# Patient Record
Sex: Female | Born: 1935
Health system: Southern US, Community
[De-identification: ages and names within clinical notes are randomized; demographics above are authoritative.]

## PROBLEM LIST (undated history)

## (undated) DIAGNOSIS — I350 Nonrheumatic aortic (valve) stenosis: Secondary | ICD-10-CM

## (undated) DIAGNOSIS — F419 Anxiety disorder, unspecified: Secondary | ICD-10-CM

## (undated) DIAGNOSIS — I10 Essential (primary) hypertension: Secondary | ICD-10-CM

## (undated) DIAGNOSIS — G4700 Insomnia, unspecified: Secondary | ICD-10-CM

## (undated) DIAGNOSIS — I779 Disorder of arteries and arterioles, unspecified: Secondary | ICD-10-CM

## (undated) DIAGNOSIS — N3281 Overactive bladder: Secondary | ICD-10-CM

## (undated) DIAGNOSIS — E119 Type 2 diabetes mellitus without complications: Secondary | ICD-10-CM

## (undated) DIAGNOSIS — I4891 Unspecified atrial fibrillation: Secondary | ICD-10-CM

## (undated) DIAGNOSIS — E785 Hyperlipidemia, unspecified: Secondary | ICD-10-CM

## (undated) HISTORY — PX: OTHER SURGICAL HISTORY: SHX169

## (undated) HISTORY — DX: Overactive bladder: N32.81

## (undated) HISTORY — DX: Unspecified atrial fibrillation: I48.91

## (undated) HISTORY — DX: Nonrheumatic aortic (valve) stenosis: I35.0

## (undated) HISTORY — DX: Anxiety disorder, unspecified: F41.9

## (undated) HISTORY — DX: Disorder of arteries and arterioles, unspecified: I77.9

## (undated) HISTORY — DX: Hyperlipidemia, unspecified: E78.5

## (undated) HISTORY — PX: APPENDECTOMY: SHX54

## (undated) HISTORY — DX: Insomnia, unspecified: G47.00

---

## 2004-09-10 ENCOUNTER — Ambulatory Visit: Payer: Self-pay | Admitting: Gastroenterology

## 2007-10-04 ENCOUNTER — Ambulatory Visit: Payer: Self-pay | Admitting: Internal Medicine

## 2007-11-07 ENCOUNTER — Ambulatory Visit: Payer: Self-pay | Admitting: Internal Medicine

## 2008-11-11 ENCOUNTER — Ambulatory Visit: Payer: Self-pay | Admitting: Internal Medicine

## 2009-01-26 ENCOUNTER — Emergency Department: Payer: Self-pay

## 2009-11-27 ENCOUNTER — Ambulatory Visit: Payer: Self-pay | Admitting: Internal Medicine

## 2011-12-13 ENCOUNTER — Ambulatory Visit: Payer: Self-pay | Admitting: Internal Medicine

## 2012-05-02 ENCOUNTER — Ambulatory Visit: Payer: Self-pay | Admitting: Family Medicine

## 2012-06-09 ENCOUNTER — Ambulatory Visit: Payer: Self-pay | Admitting: Family Medicine

## 2012-06-15 ENCOUNTER — Ambulatory Visit: Payer: Self-pay | Admitting: Family Medicine

## 2012-06-20 ENCOUNTER — Ambulatory Visit: Payer: Self-pay

## 2012-06-20 LAB — HEMATOCRIT: HCT: 43.8 % (ref 35.0–47.0)

## 2012-06-20 LAB — BASIC METABOLIC PANEL
Anion Gap: 6 — ABNORMAL LOW (ref 7–16)
BUN: 22 mg/dL — ABNORMAL HIGH (ref 7–18)
Calcium, Total: 9.3 mg/dL (ref 8.5–10.1)
Chloride: 100 mmol/L (ref 98–107)
EGFR (Non-African Amer.): >60
Glucose: 126 mg/dL — ABNORMAL HIGH (ref 65–99)
Osmolality: 281 (ref 275–301)
Potassium: 3.4 mmol/L — ABNORMAL LOW (ref 3.5–5.1)
Sodium: 138 mmol/L (ref 136–145)

## 2012-06-29 ENCOUNTER — Ambulatory Visit: Payer: Self-pay

## 2012-07-18 ENCOUNTER — Ambulatory Visit: Payer: Self-pay | Admitting: Family Medicine

## 2012-08-15 ENCOUNTER — Ambulatory Visit: Payer: Self-pay | Admitting: Family Medicine

## 2013-06-14 ENCOUNTER — Ambulatory Visit: Payer: Self-pay | Admitting: Family Medicine

## 2014-08-14 ENCOUNTER — Ambulatory Visit: Payer: Self-pay | Admitting: Family Medicine

## 2015-03-04 NOTE — Op Note (Signed)
PATIENT NAME:  Melinda Preston, Melinda Preston MR#:  [id number] DATE OF BIRTH:  03-30-1928  DATE OF PROCEDURE:  06/29/2012  PREOPERATIVE DIAGNOSIS: Possible endometrial hyperplasia.   POSTOPERATIVE DIAGNOSIS: Possible endometrial hyperplasia.   OPERATION PERFORMED: Attempted dilation and curettage (failed).   SURGEON: Deloris Pinghilip J. Luella Cookosenow, M.D.   OPERATIVE FINDINGS: No abnormal masses on bimanual examination including good examination of the cervix.   DESCRIPTION OF PROCEDURE: After adequate general anesthesia, the patient was prepped and draped in routine fashion. The cervix was grasped with a Jacob's tenaculum, and I was unable to sound the uterine cavity using both the radio sound and the lacrimal probes. Therefore, the surgery was canceled. ____________________________ Deloris PingPhilip J. Luella Cookosenow, MD pjr:slb D: 06/29/2012 11:24:54 ET T: 06/29/2012 13:43:41 ET JOB#: [id number]  cc: Deloris PingPhilip J. Luella Cookosenow, MD, <Dictator> Towana BadgerPHILIP J Boyde Grieco MD ELECTRONICALLY SIGNED 07/03/2012 11:02

## 2015-09-15 ENCOUNTER — Encounter: Payer: Self-pay | Admitting: Emergency Medicine

## 2015-09-15 ENCOUNTER — Ambulatory Visit
Admission: EM | Admit: 2015-09-15 | Discharge: 2015-09-15 | Disposition: A | Discharge status: Home / Self Care | Payer: Medicare HMO | Attending: Family Medicine | Admitting: Family Medicine

## 2015-09-15 DIAGNOSIS — M25861 Other specified joint disorders, right knee: Secondary | ICD-10-CM | POA: Diagnosis not present

## 2015-09-15 DIAGNOSIS — M25562 Pain in left knee: Secondary | ICD-10-CM

## 2015-09-15 DIAGNOSIS — M25561 Pain in right knee: Secondary | ICD-10-CM | POA: Diagnosis not present

## 2015-09-15 DIAGNOSIS — R2241 Localized swelling, mass and lump, right lower limb: Secondary | ICD-10-CM

## 2015-09-15 HISTORY — DX: Type 2 diabetes mellitus without complications: E11.9

## 2015-09-15 HISTORY — DX: Essential (primary) hypertension: I10

## 2015-09-15 NOTE — ED Notes (Signed)
Patient c/o pain in the back of her knees for 3-4 weeks.

## 2015-09-15 NOTE — ED Notes (Signed)
[facility] Appointment scheduled for November 9 at 10:30am in [place]

## 2015-09-15 NOTE — ED Provider Notes (Signed)
CSN: [id number]     Arrival date & time 09/15/15  0903 History   First MD Initiated Contact with Patient 09/15/15 1113     Chief Complaint  Patient presents with  . Leg Pain   (Consider location/radiation/quality/duration/timing/severity/associated sxs/prior Treatment) HPI Comments: 81 yo female with a 3-4 weeks h/o bilateral posterior knee pain, worse on the right. Denies any trauma, fevers, chills, redness, drainage. States the back of the right knee feels stiff.   Patient is a 81 y.o. female presenting with leg pain. The history is provided by the patient.  Leg Pain   Past Medical History  Diagnosis Date  . Hypertension   . Diabetes mellitus without complication (HCC)    History reviewed. No pertinent past surgical history. History reviewed. No pertinent family history. Social History  Substance Use Topics  . Smoking status: Never Smoker   . Smokeless tobacco: Never Used  . Alcohol Use: No   OB History    No data available     Review of Systems  Allergies  Review of patient's allergies indicates no known allergies.  Home Medications   Prior to Admission medications   Medication Sig Start Date End Date Taking? Authorizing Provider  ALPRAZolam Prudy Feeler) 0.25 MG tablet Take 0.25 mg by mouth at bedtime as needed for anxiety.   Yes Historical Provider, MD  apixaban (ELIQUIS) 2.5 MG TABS tablet Take 2.5 mg by mouth 2 (two) times daily.   Yes Historical Provider, MD  aspirin 81 MG tablet Take 81 mg by mouth daily.   Yes Historical Provider, MD  atorvastatin (LIPITOR) 20 MG tablet Take 20 mg by mouth daily.   Yes Historical Provider, MD  lisinopril-hydrochlorothiazide (PRINZIDE,ZESTORETIC) 20-12.5 MG tablet Take 1 tablet by mouth daily.   Yes Historical Provider, MD  metFORMIN (GLUCOPHAGE) 500 MG tablet Take 500 mg by mouth 2 (two) times daily with a meal.   Yes Historical Provider, MD  omega-3 acid ethyl esters (LOVAZA) 1 G capsule Take 1 g by mouth 2 (two) times daily.   Yes  Historical Provider, MD  omeprazole (PRILOSEC) 20 MG capsule Take 20 mg by mouth daily.   Yes Historical Provider, MD  oxybutynin (DITROPAN-XL) 5 MG 24 hr tablet Take 5 mg by mouth at bedtime.   Yes Historical Provider, MD  sertraline (ZOLOFT) 25 MG tablet Take 25 mg by mouth daily.   Yes Historical Provider, MD   Meds Ordered and Administered this Visit  Medications - No data to display  BP 140/78 mmHg  Pulse 76  Temp(Src) 98 F (36.7 C) (Tympanic)  Resp 20  Ht 5' (1.524 m)  Wt 161 lb (73.029 kg)  BMI 31.44 kg/m2  SpO2 98% No data found.   Physical Exam  Constitutional: She appears well-developed and well-nourished. No distress.  Musculoskeletal:       Right knee: She exhibits normal range of motion, no swelling, no ecchymosis, no laceration, no erythema and no bony tenderness.  Normal range of motion; no bony tenderness; lower extremities neurovascularly intact; 3x4cm palpable subcutaneous mass on the posterior right knee (nontender; no erythema or drainage)  Skin: She is not diaphoretic.  Nursing note and vitals reviewed.   ED Course  Procedures (including critical care time)  Labs Review Labs Reviewed - No data to display  Imaging Review No results found.   Visual Acuity Review  Right Eye Distance:   Left Eye Distance:   Bilateral Distance:    Right Eye Near:   Left Eye Near:  Bilateral Near:         MDM   1. Arthralgia of both knees   2. Mass of knee, right   (likely Baker's cyst)  1.likely diagnosis reviewed with patient 2. Recommend patient follow up with orthopedist for further evaluation and management ([facility] Nov. 9th, 2016)  Payton Mccallumrlando Rishav Rockefeller, MD 09/15/15 1156

## 2015-09-15 NOTE — ED Notes (Signed)
Bilateral thigh measurement 16.5 inches.

## 2015-11-19 DIAGNOSIS — M1990 Unspecified osteoarthritis, unspecified site: Secondary | ICD-10-CM | POA: Diagnosis not present

## 2015-11-19 DIAGNOSIS — M170 Bilateral primary osteoarthritis of knee: Secondary | ICD-10-CM | POA: Diagnosis not present

## 2015-11-26 DIAGNOSIS — M170 Bilateral primary osteoarthritis of knee: Secondary | ICD-10-CM | POA: Diagnosis not present

## 2015-12-03 DIAGNOSIS — M170 Bilateral primary osteoarthritis of knee: Secondary | ICD-10-CM | POA: Diagnosis not present

## 2015-12-22 ENCOUNTER — Encounter: Payer: Self-pay | Admitting: Unknown Physician Specialty

## 2015-12-22 ENCOUNTER — Ambulatory Visit (INDEPENDENT_AMBULATORY_CARE_PROVIDER_SITE_OTHER): Payer: Medicare Other | Admitting: Unknown Physician Specialty

## 2015-12-22 VITALS — BP 155/87 | HR 105 | Temp 97.7°F | Ht 59.7 in | Wt 145.2 lb

## 2015-12-22 DIAGNOSIS — D649 Anemia, unspecified: Secondary | ICD-10-CM | POA: Diagnosis not present

## 2015-12-22 DIAGNOSIS — E119 Type 2 diabetes mellitus without complications: Secondary | ICD-10-CM

## 2015-12-22 DIAGNOSIS — N3281 Overactive bladder: Secondary | ICD-10-CM | POA: Diagnosis not present

## 2015-12-22 DIAGNOSIS — R634 Abnormal weight loss: Secondary | ICD-10-CM | POA: Diagnosis not present

## 2015-12-22 DIAGNOSIS — M170 Bilateral primary osteoarthritis of knee: Secondary | ICD-10-CM | POA: Diagnosis not present

## 2015-12-22 DIAGNOSIS — I10 Essential (primary) hypertension: Secondary | ICD-10-CM | POA: Diagnosis not present

## 2015-12-22 DIAGNOSIS — I482 Chronic atrial fibrillation, unspecified: Secondary | ICD-10-CM

## 2015-12-22 DIAGNOSIS — M1990 Unspecified osteoarthritis, unspecified site: Secondary | ICD-10-CM | POA: Insufficient documentation

## 2015-12-22 DIAGNOSIS — F329 Major depressive disorder, single episode, unspecified: Secondary | ICD-10-CM | POA: Diagnosis not present

## 2015-12-22 DIAGNOSIS — E7800 Pure hypercholesterolemia, unspecified: Secondary | ICD-10-CM

## 2015-12-22 DIAGNOSIS — G4700 Insomnia, unspecified: Secondary | ICD-10-CM

## 2015-12-22 DIAGNOSIS — F32A Depression, unspecified: Secondary | ICD-10-CM

## 2015-12-22 LAB — LIPID PANEL PICCOLO, WAIVED
CHOL/HDL RATIO PICCOLO,WAIVE: 2.7 mg/dL
Cholesterol Piccolo, Waived: 162 mg/dL (ref <200)
HDL Chol Piccolo, Waived: 61 mg/dL (ref >59)
LDL CHOL CALC PICCOLO WAIVED: 74 mg/dL (ref <100)
Triglycerides Piccolo,Waived: 137 mg/dL (ref <150)
VLDL Chol Calc Piccolo,Waive: 27 mg/dL (ref <30)

## 2015-12-22 LAB — BAYER DCA HB A1C WAIVED: HB A1C: 6.5 % (ref <7.0)

## 2015-12-22 MED ORDER — TRAZODONE HCL 50 MG PO TABS: 25.0000 mg | ORAL_TABLET | Freq: Every evening | ORAL | Status: DC | PRN

## 2015-12-22 MED ORDER — APIXABAN 2.5 MG PO TABS: 2.5000 mg | ORAL_TABLET | Freq: Two times a day (BID) | ORAL | Status: DC

## 2015-12-22 NOTE — Assessment & Plan Note (Addendum)
Taking iron supplements.  I cannot find the reference in old charts.  Last CBC was good.  Stop iron

## 2015-12-22 NOTE — Patient Instructions (Addendum)
Insomnia Insomnia is a sleep disorder that makes it difficult to fall asleep or to stay asleep. Insomnia can cause tiredness (fatigue), low energy, difficulty concentrating, mood swings, and poor performance at work or school.  There are three different ways to classify insomnia:  Difficulty falling asleep.  Difficulty staying asleep.  Waking up too early in the morning. Any type of insomnia can be long-term (chronic) or short-term (acute). Both are common. Short-term insomnia usually lasts for three months or less. Chronic insomnia occurs at least three times a week for longer than three months. CAUSES  Insomnia may be caused by another condition, situation, or substance, such as:  Anxiety.  Certain medicines.  Gastroesophageal reflux disease (GERD) or other gastrointestinal conditions.  Asthma or other breathing conditions.  Restless legs syndrome, sleep apnea, or other sleep disorders.  Chronic pain.  Menopause. This may include hot flashes.  Stroke.  Abuse of alcohol, tobacco, or illegal drugs.  Depression.  Caffeine.   Neurological disorders, such as Alzheimer disease.  An overactive thyroid (hyperthyroidism). The cause of insomnia may not be known. RISK FACTORS Risk factors for insomnia include:  Gender. Women are more commonly affected than men.  Age. Insomnia is more common as you get older.  Stress. This may involve your professional or personal life.  Income. Insomnia is more common in people with lower income.  Lack of exercise.   Irregular work schedule or night shifts.  Traveling between different time zones. SIGNS AND SYMPTOMS If you have insomnia, trouble falling asleep or trouble staying asleep is the main symptom. This may lead to other symptoms, such as:  Feeling fatigued.  Feeling nervous about going to sleep.  Not feeling rested in the morning.  Having trouble concentrating.  Feeling irritable, anxious, or depressed. TREATMENT   Treatment for insomnia depends on the cause. If your insomnia is caused by an underlying condition, treatment will focus on addressing the condition. Treatment may also include:   Medicines to help you sleep.  Counseling or therapy.  Lifestyle adjustments. HOME CARE INSTRUCTIONS   Take medicines only as directed by your health care provider.  Keep regular sleeping and waking hours. Avoid naps.  Keep a sleep diary to help you and your health care provider figure out what could be causing your insomnia. Include:   When you sleep.  When you wake up during the night.  How well you sleep.   How rested you feel the next day.  Any side effects of medicines you are taking.  What you eat and drink.   Make your bedroom a comfortable place where it is easy to fall asleep:  Put up shades or special blackout curtains to block light from outside.  Use a white noise machine to block noise.  Keep the temperature cool.   Exercise regularly as directed by your health care provider. Avoid exercising right before bedtime.  Use relaxation techniques to manage stress. Ask your health care provider to suggest some techniques that may work well for you. These may include:  Breathing exercises.  Routines to release muscle tension.  Visualizing peaceful scenes.  Cut back on alcohol, caffeinated beverages, and cigarettes, especially close to bedtime. These can disrupt your sleep.  Do not overeat or eat spicy foods right before bedtime. This can lead to digestive discomfort that can make it hard for you to sleep.  Limit screen use before bedtime. This includes:  Watching TV.  Using your smartphone, tablet, and computer.  Stick to a routine. This   can help you fall asleep faster. Try to do a quiet activity, brush your teeth, and go to bed at the same time each night.  Get out of bed if you are still awake after 15 minutes of trying to sleep. Keep the lights down, but try reading or  doing a quiet activity. When you feel sleepy, go back to bed.  Make sure that you drive carefully. Avoid driving if you feel very sleepy.  Keep all follow-up appointments as directed by your health care provider. This is important. SEEK MEDICAL CARE IF:   You are tired throughout the day or have trouble in your daily routine due to sleepiness.  You continue to have sleep problems or your sleep problems get worse. SEEK IMMEDIATE MEDICAL CARE IF:   You have serious thoughts about hurting yourself or someone else.   This information is not intended to replace advice given to you by your health care provider. Make sure you discuss any questions you have with your health care provider.   Document Released: 10/29/2000 Document Revised: 07/23/2015 Document Reviewed: 08/02/2014 Elsevier Interactive Patient Education 2016 Elsevier Inc.  Insomnia: Try taking Melatonin 1 mg.  Do not go higher than this! Start Trazadone 50 mg at night for sleep  Stop Metfomin, Oxybutynin ER, and Iron

## 2015-12-22 NOTE — Assessment & Plan Note (Signed)
Per Dr. Cassie Freer.  On Elquis

## 2015-12-22 NOTE — Assessment & Plan Note (Addendum)
Out of Metformin for several week.  Hgb A1C was 6.5 today.  I would like to stop the Metformin

## 2015-12-22 NOTE — Progress Notes (Signed)
BP 155/87 mmHg  Pulse 105  Temp(Src) 97.7 F (36.5 C)  Ht 4' 11.7" (1.516 m)  Wt 145 lb 3.2 oz (65.862 kg)  BMI 28.66 kg/m2  SpO2 93%  LMP  (LMP Unknown)   Subjective:    Patient ID: Melinda Preston, female    DOB: [birth year], 82 y.o.   MRN: [id number]  HPI: Melinda Preston is a 82 y.o. female  Chief Complaint  Patient presents with  . Establish Care   Diabetes Reviewed notes from [facility]. Her last labs were in October with a Hgb Aic of 6.1.  Gets yearly eye exams  Hypercholesterol Total cholesterol is 126.  Last LDL was 56.  Taking Atorvastatin  Hypertension Taking Lisinopril/HCTZ and  Diltiazem  Knee pain Reviewed notes from [facility].  Significant OA.  Feels good today.  Multiple steroid shots.    AFIB Sees Dr. Cassie Freer for management.  Takes daily Elquis.    Anxiety/depression She lost a lot of weight and unclear for what reasons but it sounds like poor appetite due to a lot of stress.  Her appetite is now better but still struggling with sleep.  She is taking Sertraline 25 mg she says isn't helping Has a prescription for Xanax that she got to help her sleep. She states it did not help her sleep Depression screen [facility] 2/9 12/22/2015  Decreased Interest 0  Down, Depressed, Hopeless 2  PHQ - 2 Score 2  Altered sleeping 3  Tired, decreased energy 3  Change in appetite 2  Feeling bad or failure about yourself  0  Trouble concentrating 0  Moving slowly or fidgety/restless 0  Suicidal thoughts 0  PHQ-9 Score 10   Anemia On iron supplements for some reason.  I've reviewed care everywhere chart and have not seen it.    Overactive bladder Ditropan not working  Past Surgical History  Procedure Laterality Date  . Rotator cuff surgery Left   . Appendectomy     Family History  Problem Relation Age of Onset  . Heart disease Father   . Heart disease Brother   . Diabetes Son       Relevant past medical, surgical,  family and social history reviewed and updated as indicated. Interim medical history since our last visit reviewed. Allergies and medications reviewed and updated.  Review of Systems  Constitutional: Positive for fatigue.  HENT: Negative.   Eyes: Negative.   Cardiovascular: Negative.   Gastrointestinal: Negative.   Endocrine: Negative.   Genitourinary: Negative.   Musculoskeletal: Negative.   Skin: Negative.   Allergic/Immunologic: Negative.   Neurological: Negative.   Hematological: Negative.   Psychiatric/Behavioral: The patient is nervous/anxious.     Per HPI unless specifically indicated above     Objective:    BP 155/87 mmHg  Pulse 105  Temp(Src) 97.7 F (36.5 C)  Ht 4' 11.7" (1.516 m)  Wt 145 lb 3.2 oz (65.862 kg)  BMI 28.66 kg/m2  SpO2 93%  LMP  (LMP Unknown)  Wt Readings from Last 3 Encounters:  12/22/15 145 lb 3.2 oz (65.862 kg)  09/15/15 161 lb (73.029 kg)    Physical Exam  Constitutional: She is oriented to person, place, and time. She appears well-developed and well-nourished. No distress.  HENT:  Head: Normocephalic and atraumatic.  Eyes: Conjunctivae and lids are normal. Right eye exhibits no discharge. Left eye exhibits no discharge. No scleral icterus.  Cardiovascular: An irregularly irregular rhythm present.  Pulmonary/Chest: Effort normal and breath sounds  normal.  Abdominal: Normal appearance. There is no splenomegaly or hepatomegaly.  Musculoskeletal: Normal range of motion.  Neurological: She is alert and oriented to person, place, and time.  Skin: Skin is intact. No rash noted. No pallor.  Psychiatric: She has a normal mood and affect. Her behavior is normal. Judgment and thought content normal.       Assessment & Plan:   Problem List Items Addressed This Visit      Unprioritized   Type 2 diabetes mellitus (HCC)    Out of Metformin for several week.  Hgb A1C was 6.5 today.  I would like to stop the Metformin      Relevant Orders    Comprehensive metabolic panel   Bayer DCA Hb V4U Waived   Hypercholesterolemia    LDL is stable at 74      Relevant Medications   diltiazem (CARTIA XT) 180 MG 24 hr capsule   apixaban (ELIQUIS) 2.5 MG TABS tablet   Other Relevant Orders   Lipid Panel Piccolo, Waived   Essential hypertension   Relevant Medications   diltiazem (CARTIA XT) 180 MG 24 hr capsule   apixaban (ELIQUIS) 2.5 MG TABS tablet   Depression   Relevant Medications   traZODone (DESYREL) 50 MG tablet   Chronic atrial fibrillation (HCC)    Per Dr. Cassie Freer.  On Elquis      Relevant Medications   diltiazem (CARTIA XT) 180 MG 24 hr capsule   apixaban (ELIQUIS) 2.5 MG TABS tablet   Arthritis, senescent   Anemia    Taking iron supplements.  I cannot find the reference in old charts.  Last CBC was good.  Stop iron      Relevant Medications   ferrous sulfate 325 (65 FE) MG tablet   Insomnia    This seems to be her primary problem.  Sleep hygeine and Melatonin.  Add Trazadone as taking just a small dose of sertraline      Overactive bladder    Stop Oxybutynin ER for no effect.         Other Visit Diagnoses    Abnormal weight loss    -  Primary    check labs.  this seems to be related to stress.  Pt does not think Sertraline is working but I suspect it is.      Relevant Orders    CBC with Differential/Platelet    TSH        Follow up plan: Return in about 3 months (around 03/20/2016) for Medicare physical.

## 2015-12-22 NOTE — Assessment & Plan Note (Addendum)
Stop Oxybutynin ER for no effect.

## 2015-12-22 NOTE — Assessment & Plan Note (Signed)
LDL is stable at 74

## 2015-12-22 NOTE — Assessment & Plan Note (Addendum)
This seems to be her primary problem.  Sleep hygeine and Melatonin.  Add Trazadone as taking just a small dose of sertraline

## 2015-12-23 LAB — CBC WITH DIFFERENTIAL/PLATELET
Basophils Absolute: 0 10*3/uL (ref 0.0–0.2)
Basos: 0 %
EOS (ABSOLUTE): 0 10*3/uL (ref 0.0–0.4)
Eos: 0 %
HEMATOCRIT: 39.2 % (ref 34.0–46.6)
HEMOGLOBIN: 12.8 g/dL (ref 11.1–15.9)
Immature Grans (Abs): 0 10*3/uL (ref 0.0–0.1)
Immature Granulocytes: 1 %
LYMPHS ABS: 2 10*3/uL (ref 0.7–3.1)
Lymphs: 25 %
MCH: 25.6 pg — ABNORMAL LOW (ref 26.6–33.0)
MCHC: 32.7 g/dL (ref 31.5–35.7)
MCV: 78 fL — AB (ref 79–97)
MONOS ABS: 0.7 10*3/uL (ref 0.1–0.9)
Monocytes: 9 %
Neutrophils Absolute: 5.2 10*3/uL (ref 1.4–7.0)
Neutrophils: 65 %
Platelets: 317 10*3/uL (ref 150–379)
RBC: 5 x10E6/uL (ref 3.77–5.28)
RDW: 16.4 % — AB (ref 12.3–15.4)
WBC: 7.9 10*3/uL (ref 3.4–10.8)

## 2015-12-23 LAB — COMPREHENSIVE METABOLIC PANEL
A/G RATIO: 1.5 (ref 1.1–2.5)
ALBUMIN: 4.1 g/dL (ref 3.5–4.7)
ALK PHOS: 76 IU/L (ref 39–117)
ALT: 13 IU/L (ref 0–32)
AST: 14 IU/L (ref 0–40)
BILIRUBIN TOTAL: 0.5 mg/dL (ref 0.0–1.2)
BUN / CREAT RATIO: 16 (ref 11–26)
BUN: 10 mg/dL (ref 8–27)
CALCIUM: 9.6 mg/dL (ref 8.7–10.3)
CHLORIDE: 99 mmol/L (ref 96–106)
CO2: 25 mmol/L (ref 18–29)
Creatinine, Ser: 0.63 mg/dL (ref 0.57–1.00)
GFR calc non Af Amer: 82 mL/min/{1.73_m2} (ref >59)
GFR, EST AFRICAN AMERICAN: 95 mL/min/{1.73_m2} (ref >59)
GLOBULIN, TOTAL: 2.8 g/dL (ref 1.5–4.5)
Glucose: 115 mg/dL — ABNORMAL HIGH (ref 65–99)
Potassium: 4 mmol/L (ref 3.5–5.2)
SODIUM: 143 mmol/L (ref 134–144)
Total Protein: 6.9 g/dL (ref 6.0–8.5)

## 2015-12-23 LAB — TSH: TSH: 1.79 u[IU]/mL (ref 0.450–4.500)

## 2015-12-25 ENCOUNTER — Telehealth: Payer: Self-pay | Admitting: Unknown Physician Specialty

## 2015-12-25 NOTE — Telephone Encounter (Signed)
Pt would like a call back regarding her labs. 

## 2015-12-25 NOTE — Telephone Encounter (Signed)
There is a note on there to call and tell her labs are normal on 2/7.  I guess we need to know what questions she has.

## 2015-12-25 NOTE — Telephone Encounter (Signed)
I don't see where we sent this patient a letter. Is there anything I can tell her about her labs or do you need to call her?

## 2015-12-26 MED ORDER — ATORVASTATIN CALCIUM 20 MG PO TABS: 20.0000 mg | ORAL_TABLET | Freq: Every day | ORAL | Status: DC

## 2015-12-26 MED ORDER — DILTIAZEM HCL ER COATED BEADS 180 MG PO CP24: 180.0000 mg | ORAL_CAPSULE | Freq: Every day | ORAL | Status: DC

## 2015-12-26 MED ORDER — METFORMIN HCL 500 MG PO TABS: 500.0000 mg | ORAL_TABLET | Freq: Two times a day (BID) | ORAL | Status: DC

## 2015-12-26 MED ORDER — TRAZODONE HCL 50 MG PO TABS: 25.0000 mg | ORAL_TABLET | Freq: Every evening | ORAL | Status: DC | PRN

## 2015-12-26 MED ORDER — SERTRALINE HCL 25 MG PO TABS: 25.0000 mg | ORAL_TABLET | Freq: Every day | ORAL | Status: DC

## 2015-12-26 MED ORDER — OMEPRAZOLE 20 MG PO CPDR: 20.0000 mg | DELAYED_RELEASE_CAPSULE | Freq: Every day | ORAL | Status: DC

## 2015-12-26 MED ORDER — OXYBUTYNIN CHLORIDE ER 5 MG PO TB24: 5.0000 mg | ORAL_TABLET | Freq: Every day | ORAL | Status: DC

## 2015-12-26 MED ORDER — LISINOPRIL-HYDROCHLOROTHIAZIDE 20-12.5 MG PO TABS: 1.0000 | ORAL_TABLET | Freq: Every day | ORAL | Status: DC

## 2015-12-26 NOTE — Telephone Encounter (Signed)
Called and spoke to patient. She wanted to make sure her medications were being sent to [facility]. I told the patient that her Eliquis was sent in on Monday and she states she needs all other medications sent to them as well.

## 2015-12-26 NOTE — Telephone Encounter (Signed)
While on the phone patient states her hand has been swelling and wanted to let us know that she had her prednisone refilled yesterday at [facility] and that she is having x-rays of her hand done next Wednesday. Patient also asked about her medications being sent to the mail order pharmacy. The other one that was sent was Eliquis. All others need to be sent to [facility] as well.

## 2015-12-26 NOTE — Telephone Encounter (Signed)
Patient has questions regarding her medication and needs to talk to [facility] or CMA about it. Patient number is [phone].

## 2015-12-26 NOTE — Telephone Encounter (Signed)
Melinda Preston I see that all medications were sent in except for lisinopril-hctz. Did you mean to not fill it?

## 2015-12-26 NOTE — Telephone Encounter (Signed)
Called and spoke to patient. I left her know that her labs were normal again.

## 2015-12-31 DIAGNOSIS — M1990 Unspecified osteoarthritis, unspecified site: Secondary | ICD-10-CM | POA: Diagnosis not present

## 2016-01-05 ENCOUNTER — Telehealth: Payer: Self-pay

## 2016-01-05 NOTE — Telephone Encounter (Signed)
Patient called and stated that she got metformin and oxybutin in the mail. She states that at her visit, Elnita Maxwell wanted her to stop taking both of these medications. Patient is confused and doesn't know if she should be taking them or not. I see if the note where it says to stop both medications but patient wanted me to ask Elnita Maxwell to be sure.

## 2016-01-05 NOTE — Telephone Encounter (Signed)
Please tell her to stop them and call [facility] to cancel them. The problem is that the mail order will keep on sending them unless they are called.  The problems is that the mail order continues to send me requests

## 2016-01-05 NOTE — Telephone Encounter (Signed)
Called [facility] and cancelled the medications. Then I called the patient and told her that Melinda Preston said for her to not take those medications. While on the phone, patient said she was having swelling in both ankles and wanted to be seen. I scheduled her an appointment for 01/07/16.

## 2016-01-07 ENCOUNTER — Encounter: Payer: Self-pay | Admitting: Unknown Physician Specialty

## 2016-01-07 ENCOUNTER — Ambulatory Visit (INDEPENDENT_AMBULATORY_CARE_PROVIDER_SITE_OTHER): Payer: Medicare Other | Admitting: Unknown Physician Specialty

## 2016-01-07 ENCOUNTER — Other Ambulatory Visit: Payer: Self-pay | Admitting: Unknown Physician Specialty

## 2016-01-07 VITALS — BP 140/82 | HR 97 | Temp 98.5°F | Ht 59.9 in | Wt 150.8 lb

## 2016-01-07 DIAGNOSIS — R609 Edema, unspecified: Secondary | ICD-10-CM | POA: Diagnosis not present

## 2016-01-07 DIAGNOSIS — M25473 Effusion, unspecified ankle: Secondary | ICD-10-CM

## 2016-01-07 MED ORDER — FUROSEMIDE 20 MG PO TABS: 20.0000 mg | ORAL_TABLET | Freq: Every day | ORAL | Status: DC

## 2016-01-07 NOTE — Progress Notes (Signed)
BP 140/82 mmHg  Pulse 97  Temp(Src) 98.5 F (36.9 C)  Ht 4' 11.9" (1.521 m)  Wt 150 lb 12.8 oz (68.402 kg)  BMI 29.57 kg/m2  SpO2 92%  LMP  (LMP Unknown)   Subjective:    Patient ID: Melinda Preston, female    DOB: January 25, 1928, 82 y.o.   MRN: [id number]  HPI: Melinda Preston is a 83 y.o. female  Chief Complaint  Patient presents with  . Foot Swelling    pt states she has had swelling in both feet and ankles for about 2 weeks now   Pt with foot and ankle swelling.  She states left ankle just started and right ankle for about 2-3 weeks.  She was taking Prednisone through Orthopedics with a total of 60 pills.  She stopped about 2 weeks ago.    She is on Lisinopril with 12.5 mg of HCTZ.  Sees Dr. Cassie Freer in March.  Denies chest pain and SOB.  Last GFR was above 80  Relevant past medical, surgical, family and social history reviewed and updated as indicated. Interim medical history since our last visit reviewed. Allergies and medications reviewed and updated.  Review of Systems  Per HPI unless specifically indicated above     Objective:    BP 140/82 mmHg  Pulse 97  Temp(Src) 98.5 F (36.9 C)  Ht 4' 11.9" (1.521 m)  Wt 150 lb 12.8 oz (68.402 kg)  BMI 29.57 kg/m2  SpO2 92%  LMP  (LMP Unknown)  Wt Readings from Last 3 Encounters:  01/07/16 150 lb 12.8 oz (68.402 kg)  12/22/15 145 lb 3.2 oz (65.862 kg)  09/15/15 161 lb (73.029 kg)    Physical Exam  Constitutional: She is oriented to person, place, and time. She appears well-developed and well-nourished. No distress.  HENT:  Head: Normocephalic and atraumatic.  Eyes: Conjunctivae and lids are normal. Right eye exhibits no discharge. Left eye exhibits no discharge. No scleral icterus.  Neck: Normal range of motion. Neck supple. No JVD present. Carotid bruit is not present.  Cardiovascular: Normal rate, regular rhythm and normal pulses.   Murmur heard.  Systolic murmur is present with a grade of 2/6   Pulmonary/Chest: Effort normal and breath sounds normal.  Abdominal: Normal appearance. There is no splenomegaly or hepatomegaly.  Musculoskeletal: Normal range of motion. She exhibits edema.  Ankles and feet with 2 plus edema  Neurological: She is alert and oriented to person, place, and time.  Skin: Skin is warm, dry and intact. No rash noted. No pallor.  Psychiatric: She has a normal mood and affect. Her behavior is normal. Judgment and thought content normal.    Results for orders placed or performed in visit on 12/22/15  CBC with Differential/Platelet  Result Value Ref Range   WBC 7.9 3.4 - 10.8 x10E3/uL   RBC 5.00 3.77 - 5.28 x10E6/uL   Hemoglobin 12.8 11.1 - 15.9 g/dL   Hematocrit 40.9 81.1 - 46.6 %   MCV 78 (L) 79 - 97 fL   MCH 25.6 (L) 26.6 - 33.0 pg   MCHC 32.7 31.5 - 35.7 g/dL   RDW 91.4 (H) 78.2 - 95.6 %   Platelets 317 150 - 379 x10E3/uL   Neutrophils 65 %   Lymphs 25 %   Monocytes 9 %   Eos 0 %   Basos 0 %   Neutrophils Absolute 5.2 1.4 - 7.0 x10E3/uL   Lymphocytes Absolute 2.0 0.7 - 3.1 x10E3/uL   Monocytes Absolute 0.7  0.1 - 0.9 x10E3/uL   EOS (ABSOLUTE) 0.0 0.0 - 0.4 x10E3/uL   Basophils Absolute 0.0 0.0 - 0.2 x10E3/uL   Immature Granulocytes 1 %   Immature Grans (Abs) 0.0 0.0 - 0.1 x10E3/uL  Comprehensive metabolic panel  Result Value Ref Range   Glucose 115 (H) 65 - 99 mg/dL   BUN 10 8 - 27 mg/dL   Creatinine, Ser 1.61 0.57 - 1.00 mg/dL   GFR calc non Af Amer 82 >59 mL/min/1.73   GFR calc Af Amer 95 >59 mL/min/1.73   BUN/Creatinine Ratio 16 11 - 26   Sodium 143 134 - 144 mmol/L   Potassium 4.0 3.5 - 5.2 mmol/L   Chloride 99 96 - 106 mmol/L   CO2 25 18 - 29 mmol/L   Calcium 9.6 8.7 - 10.3 mg/dL   Total Protein 6.9 6.0 - 8.5 g/dL   Albumin 4.1 3.5 - 4.7 g/dL   Globulin, Total 2.8 1.5 - 4.5 g/dL   Albumin/Globulin Ratio 1.5 1.1 - 2.5   Bilirubin Total 0.5 0.0 - 1.2 mg/dL   Alkaline Phosphatase 76 39 - 117 IU/L   AST 14 0 - 40 IU/L   ALT 13 0 - 32  IU/L  TSH  Result Value Ref Range   TSH 1.790 0.450 - 4.500 uIU/mL  Bayer DCA Hb A1c Waived  Result Value Ref Range   Bayer DCA Hb A1c Waived 6.5 <7.0 %  Lipid Panel Piccolo, Waived  Result Value Ref Range   Cholesterol Piccolo, Waived 162 <200 mg/dL   HDL Chol Piccolo, Waived 61 >59 mg/dL   Triglycerides Piccolo,Waived 137 <150 mg/dL   Chol/HDL Ratio Piccolo,Waive 2.7 mg/dL   LDL Chol Calc Piccolo Waived 74 <100 mg/dL   VLDL Chol Calc Piccolo,Waive 27 <30 mg/dL      Assessment & Plan:   Problem List Items Addressed This Visit      Unprioritized   Ankle edema - Primary    I will give her Lasix for about 5 days.  I suspect much of the edema is related to Prednisone she took through Orthopedics.            Follow up plan: Return if symptoms worsen or fail to improve.

## 2016-01-07 NOTE — Assessment & Plan Note (Signed)
I will give her Lasix for about 5 days.  I suspect much of the edema is related to Prednisone she took through Orthopedics.

## 2016-01-14 DIAGNOSIS — E7800 Pure hypercholesterolemia, unspecified: Secondary | ICD-10-CM | POA: Diagnosis not present

## 2016-01-14 DIAGNOSIS — I350 Nonrheumatic aortic (valve) stenosis: Secondary | ICD-10-CM | POA: Diagnosis not present

## 2016-01-14 DIAGNOSIS — I480 Paroxysmal atrial fibrillation: Secondary | ICD-10-CM | POA: Diagnosis not present

## 2016-01-14 DIAGNOSIS — I481 Persistent atrial fibrillation: Secondary | ICD-10-CM | POA: Diagnosis not present

## 2016-01-14 DIAGNOSIS — I10 Essential (primary) hypertension: Secondary | ICD-10-CM | POA: Diagnosis not present

## 2016-03-22 ENCOUNTER — Ambulatory Visit (INDEPENDENT_AMBULATORY_CARE_PROVIDER_SITE_OTHER): Payer: Medicare Other | Admitting: Unknown Physician Specialty

## 2016-03-22 ENCOUNTER — Encounter: Payer: Self-pay | Admitting: Unknown Physician Specialty

## 2016-03-22 VITALS — BP 138/90 | HR 105 | Temp 98.4°F | Ht 59.5 in | Wt 155.2 lb

## 2016-03-22 DIAGNOSIS — R609 Edema, unspecified: Secondary | ICD-10-CM | POA: Diagnosis not present

## 2016-03-22 DIAGNOSIS — I10 Essential (primary) hypertension: Secondary | ICD-10-CM

## 2016-03-22 DIAGNOSIS — N3281 Overactive bladder: Secondary | ICD-10-CM | POA: Diagnosis not present

## 2016-03-22 DIAGNOSIS — I482 Chronic atrial fibrillation, unspecified: Secondary | ICD-10-CM

## 2016-03-22 DIAGNOSIS — Z1231 Encounter for screening mammogram for malignant neoplasm of breast: Secondary | ICD-10-CM

## 2016-03-22 DIAGNOSIS — E7800 Pure hypercholesterolemia, unspecified: Secondary | ICD-10-CM | POA: Diagnosis not present

## 2016-03-22 DIAGNOSIS — E2839 Other primary ovarian failure: Secondary | ICD-10-CM

## 2016-03-22 DIAGNOSIS — Z0000 Encounter for general adult medical examination without abnormal findings: Secondary | ICD-10-CM

## 2016-03-22 DIAGNOSIS — M25473 Effusion, unspecified ankle: Secondary | ICD-10-CM

## 2016-03-22 MED ORDER — LISINOPRIL-HYDROCHLOROTHIAZIDE 20-25 MG PO TABS: 1.0000 | ORAL_TABLET | Freq: Every day | ORAL | Status: DC

## 2016-03-22 NOTE — Progress Notes (Signed)
BP 138/90 mmHg  Pulse 105  Temp(Src) 98.4 F (36.9 C)  Ht 4' 11.5" (1.511 m)  Wt 155 lb 3.2 oz (70.398 kg)  BMI 30.83 kg/m2  SpO2 95%  LMP  (LMP Unknown)   Subjective:    Patient ID: Melinda Preston, female    DOB: September 20, 1928, 82 y.o.   MRN: [id number]  HPI: Melinda Preston is a 82 y.o. female  Chief Complaint  Patient presents with  . Medicare Wellness  . Joint Swelling    pt states her ankles have been swelling   Functional Status Survey: Is the patient deaf or have difficulty hearing?: No Does the patient have difficulty seeing, even when wearing glasses/contacts?: Yes Does the patient have difficulty concentrating, remembering, or making decisions?: Yes (pt states she is getting a little forgetful) Does the patient have difficulty walking or climbing stairs?: No Does the patient have difficulty dressing or bathing?: No Does the patient have difficulty doing errands alone such as visiting a doctor's office or shopping?: No  Fall Risk  03/22/2016 12/22/2015  Falls in the past year? No No   [facility] 03/22/2016 12/22/2015  Decreased Interest 0 0  Down, Depressed, Hopeless 0 2  PHQ - 2 Score 0 2  Altered sleeping - 3  Tired, decreased energy - 3  Change in appetite - 2  Feeling bad or failure about yourself  - 0  Trouble concentrating - 0  Moving slowly or fidgety/restless - 0  Suicidal thoughts - 0  PHQ-9 Score - 10    Mini cog is negative.    Diabetes:  Using medications without difficulties No hypoglycemic episodes No hyperglycemic episodes Feet problems:ankle swelling Blood Sugars averaging: Not checking   Hypertension:  Using medications without difficulty Average home BPs Not checking  Using medication without problems or lightheadedness No chest pain with exertion or shortness of breath No Edema  Elevated Cholesterol: Using medications without problems: No Muscle aches:  Diet compliance: Exercise:   Overactive  bladder "Looks like I am taking in more water than I am drinking"  She would like to restrt Oxybutin   Relevant past medical, surgical, family and social history reviewed and updated as indicated. Interim medical history since our last visit reviewed. Allergies and medications reviewed and updated.  Review of Systems  Per HPI unless specifically indicated above     Objective:    BP 138/90 mmHg  Pulse 105  Temp(Src) 98.4 F (36.9 C)  Ht 4' 11.5" (1.511 m)  Wt 155 lb 3.2 oz (70.398 kg)  BMI 30.83 kg/m2  SpO2 95%  LMP  (LMP Unknown)  Wt Readings from Last 3 Encounters:  03/22/16 155 lb 3.2 oz (70.398 kg)  01/07/16 150 lb 12.8 oz (68.402 kg)  12/22/15 145 lb 3.2 oz (65.862 kg)    Physical Exam  Constitutional: She is oriented to person, place, and time. She appears well-developed and well-nourished.  HENT:  Head: Normocephalic and atraumatic.  Eyes: Pupils are equal, round, and reactive to light. Right eye exhibits no discharge. Left eye exhibits no discharge. No scleral icterus.  Neck: Normal range of motion. Neck supple. Carotid bruit is not present. No thyromegaly present.  Cardiovascular: Normal rate and normal heart sounds.  An irregularly irregular rhythm present. Exam reveals no gallop and no friction rub.   No murmur heard. Pulmonary/Chest: Effort normal and breath sounds normal. No respiratory distress. She has no wheezes. She has no rales.  Abdominal: Soft. Bowel sounds are normal.  There is no tenderness. There is no rebound.  Genitourinary: No breast swelling, tenderness or discharge.  Musculoskeletal: Normal range of motion. She exhibits edema.  Edema about 2 plus bilateral ankles.    Lymphadenopathy:    She has no cervical adenopathy.  Neurological: She is alert and oriented to person, place, and time.  Skin: Skin is warm, dry and intact. No rash noted.  Psychiatric: She has a normal mood and affect. Her speech is normal and behavior is normal. Judgment and  thought content normal. Cognition and memory are normal.    Diabetic Foot Exam - Simple   Simple Foot Form  Diabetic Foot exam was performed with the following findings:  Yes 03/22/2016 10:40 AM  Visual Inspection  No deformities, no ulcerations, no other skin breakdown bilaterally:  Yes  Sensation Testing  Intact to touch and monofilament testing bilaterally:  Yes  Pulse Check  Posterior Tibialis and Dorsalis pulse intact bilaterally:  Yes  Comments       Results for orders placed or performed in visit on 12/22/15  CBC with Differential/Platelet  Result Value Ref Range   WBC 7.9 3.4 - 10.8 x10E3/uL   RBC 5.00 3.77 - 5.28 x10E6/uL   Hemoglobin 12.8 11.1 - 15.9 g/dL   Hematocrit 09.8 11.9 - 46.6 %   MCV 78 (L) 79 - 97 fL   MCH 25.6 (L) 26.6 - 33.0 pg   MCHC 32.7 31.5 - 35.7 g/dL   RDW 14.7 (H) 82.9 - 56.2 %   Platelets 317 150 - 379 x10E3/uL   Neutrophils 65 %   Lymphs 25 %   Monocytes 9 %   Eos 0 %   Basos 0 %   Neutrophils Absolute 5.2 1.4 - 7.0 x10E3/uL   Lymphocytes Absolute 2.0 0.7 - 3.1 x10E3/uL   Monocytes Absolute 0.7 0.1 - 0.9 x10E3/uL   EOS (ABSOLUTE) 0.0 0.0 - 0.4 x10E3/uL   Basophils Absolute 0.0 0.0 - 0.2 x10E3/uL   Immature Granulocytes 1 %   Immature Grans (Abs) 0.0 0.0 - 0.1 x10E3/uL  Comprehensive metabolic panel  Result Value Ref Range   Glucose 115 (H) 65 - 99 mg/dL   BUN 10 8 - 27 mg/dL   Creatinine, Ser 1.30 0.57 - 1.00 mg/dL   GFR calc non Af Amer 82 >59 mL/min/1.73   GFR calc Af Amer 95 >59 mL/min/1.73   BUN/Creatinine Ratio 16 11 - 26   Sodium 143 134 - 144 mmol/L   Potassium 4.0 3.5 - 5.2 mmol/L   Chloride 99 96 - 106 mmol/L   CO2 25 18 - 29 mmol/L   Calcium 9.6 8.7 - 10.3 mg/dL   Total Protein 6.9 6.0 - 8.5 g/dL   Albumin 4.1 3.5 - 4.7 g/dL   Globulin, Total 2.8 1.5 - 4.5 g/dL   Albumin/Globulin Ratio 1.5 1.1 - 2.5   Bilirubin Total 0.5 0.0 - 1.2 mg/dL   Alkaline Phosphatase 76 39 - 117 IU/L   AST 14 0 - 40 IU/L   ALT 13 0 - 32 IU/L   TSH  Result Value Ref Range   TSH 1.790 0.450 - 4.500 uIU/mL  Bayer DCA Hb A1c Waived  Result Value Ref Range   Bayer DCA Hb A1c Waived 6.5 <7.0 %  Lipid Panel Piccolo, Waived  Result Value Ref Range   Cholesterol Piccolo, Waived 162 <200 mg/dL   HDL Chol Piccolo, Waived 61 >59 mg/dL   Triglycerides Piccolo,Waived 137 <150 mg/dL   Chol/HDL Ratio Piccolo,Waive 2.7 mg/dL  LDL Chol Calc Piccolo Waived 74 <100 mg/dL   VLDL Chol Calc Piccolo,Waive 27 <30 mg/dL      Assessment & Plan:   Problem List Items Addressed This Visit      Unprioritized   Ankle edema - Primary    Change Lisinopriol/HCTZ to 20/25 as a opposed to the 12.5.  Pt is already complaining of frequent urination      Relevant Orders   Comprehensive metabolic panel   Chronic atrial fibrillation (HCC)    Per Dr. Cassie Freer      Relevant Medications   furosemide (LASIX) 20 MG tablet   lisinopril-hydrochlorothiazide (PRINZIDE,ZESTORETIC) 20-25 MG tablet   Essential hypertension   Relevant Medications   furosemide (LASIX) 20 MG tablet   lisinopril-hydrochlorothiazide (PRINZIDE,ZESTORETIC) 20-25 MG tablet   Other Relevant Orders   Comprehensive metabolic panel   Hypercholesterolemia   Relevant Medications   furosemide (LASIX) 20 MG tablet   lisinopril-hydrochlorothiazide (PRINZIDE,ZESTORETIC) 20-25 MG tablet   Other Relevant Orders   Lipid Panel w/o Chol/HDL Ratio   Overactive bladder    Stay off Oxybutynin as risks outweight the small benefits she was getting.         Other Visit Diagnoses    Annual physical exam        Ovarian failure        Relevant Orders    DG Bone Density    Encounter for screening mammogram for breast cancer        Relevant Orders    MM DIGITAL SCREENING BILATERAL        Follow up plan: Return in about 4 weeks (around 04/19/2016) for swelling.

## 2016-03-22 NOTE — Assessment & Plan Note (Signed)
Change Lisinopriol/HCTZ to 20/25 as a opposed to the 12.5.  Pt is already complaining of frequent urination

## 2016-03-22 NOTE — Assessment & Plan Note (Signed)
Stay off Oxybutynin as risks outweight the small benefits she was getting.

## 2016-03-22 NOTE — Assessment & Plan Note (Signed)
Per Dr. Parachos 

## 2016-03-23 ENCOUNTER — Encounter: Payer: Self-pay | Admitting: Unknown Physician Specialty

## 2016-03-23 LAB — COMPREHENSIVE METABOLIC PANEL
A/G RATIO: 1.6 (ref 1.2–2.2)
ALBUMIN: 4.2 g/dL (ref 3.5–4.7)
ALK PHOS: 67 IU/L (ref 39–117)
ALT: 9 IU/L (ref 0–32)
AST: 13 IU/L (ref 0–40)
BUN / CREAT RATIO: 17 (ref 12–28)
BUN: 11 mg/dL (ref 8–27)
Bilirubin Total: 0.5 mg/dL (ref 0.0–1.2)
CALCIUM: 9.5 mg/dL (ref 8.7–10.3)
CO2: 28 mmol/L (ref 18–29)
Chloride: 98 mmol/L (ref 96–106)
Creatinine, Ser: 0.66 mg/dL (ref 0.57–1.00)
GFR calc Af Amer: 93 mL/min/{1.73_m2} (ref >59)
GFR, EST NON AFRICAN AMERICAN: 81 mL/min/{1.73_m2} (ref >59)
GLOBULIN, TOTAL: 2.6 g/dL (ref 1.5–4.5)
Glucose: 117 mg/dL — ABNORMAL HIGH (ref 65–99)
POTASSIUM: 3.7 mmol/L (ref 3.5–5.2)
SODIUM: 143 mmol/L (ref 134–144)
TOTAL PROTEIN: 6.8 g/dL (ref 6.0–8.5)

## 2016-03-23 LAB — LIPID PANEL W/O CHOL/HDL RATIO
CHOLESTEROL TOTAL: 131 mg/dL (ref 100–199)
HDL: 56 mg/dL (ref >39)
LDL CALC: 54 mg/dL (ref 0–99)
TRIGLYCERIDES: 104 mg/dL (ref 0–149)
VLDL CHOLESTEROL CAL: 21 mg/dL (ref 5–40)

## 2016-03-23 NOTE — Progress Notes (Signed)
Quick Note:  Normal labs. Patient notified by letter. ______ 

## 2016-04-20 ENCOUNTER — Other Ambulatory Visit: Payer: Self-pay | Admitting: Unknown Physician Specialty

## 2016-04-21 ENCOUNTER — Encounter: Payer: Self-pay | Admitting: Unknown Physician Specialty

## 2016-04-21 ENCOUNTER — Other Ambulatory Visit: Payer: Self-pay | Admitting: Unknown Physician Specialty

## 2016-04-21 ENCOUNTER — Ambulatory Visit (INDEPENDENT_AMBULATORY_CARE_PROVIDER_SITE_OTHER): Payer: Medicare Other | Admitting: Unknown Physician Specialty

## 2016-04-21 VITALS — BP 126/79 | HR 102 | Temp 98.2°F | Ht 60.8 in | Wt 154.4 lb

## 2016-04-21 DIAGNOSIS — M129 Arthropathy, unspecified: Secondary | ICD-10-CM

## 2016-04-21 DIAGNOSIS — I482 Chronic atrial fibrillation, unspecified: Secondary | ICD-10-CM

## 2016-04-21 DIAGNOSIS — R609 Edema, unspecified: Secondary | ICD-10-CM | POA: Diagnosis not present

## 2016-04-21 DIAGNOSIS — R601 Generalized edema: Secondary | ICD-10-CM

## 2016-04-21 DIAGNOSIS — I10 Essential (primary) hypertension: Secondary | ICD-10-CM

## 2016-04-21 DIAGNOSIS — M25473 Effusion, unspecified ankle: Secondary | ICD-10-CM

## 2016-04-21 MED ORDER — FUROSEMIDE 20 MG PO TABS: 20.0000 mg | ORAL_TABLET | Freq: Every day | ORAL | Status: DC

## 2016-04-21 MED ORDER — POTASSIUM CHLORIDE CRYS ER 20 MEQ PO TBCR: 20.0000 meq | EXTENDED_RELEASE_TABLET | Freq: Every day | ORAL | Status: DC

## 2016-04-21 MED ORDER — LISINOPRIL 20 MG PO TABS: 20.0000 mg | ORAL_TABLET | Freq: Every day | ORAL | Status: DC

## 2016-04-21 NOTE — Patient Instructions (Signed)
Stop Lisinopril HCTZ and take plain Lisinopril Start Furosemide 20 mg daily -for fluid Start KCL daily for potassium

## 2016-04-21 NOTE — Assessment & Plan Note (Signed)
stable °

## 2016-04-21 NOTE — Progress Notes (Signed)
BP 126/79 mmHg  Pulse 102  Temp(Src) 98.2 F (36.8 C)  Ht 5' 0.8" (1.544 m)  Wt 154 lb 6.4 oz (70.035 kg)  BMI 29.38 kg/m2  SpO2 95%  LMP  (LMP Unknown)   Subjective:    Patient ID: Melinda Preston, female    DOB: 24-Feb-1928, 82 y.o.   MRN: [id number]  HPI: Melinda Preston is a 82 y.o. female  Chief Complaint  Patient presents with  . Edema    4 weel f/u    Edema Last visit increased HCTZ to 25 mg but swelling not improved.  States her legs are worse and even having some  Swelling in hands.  States she is not "walking as good." and feels "stiff."  Review of labs show normal kidney function.  A little SOB.  No chest pain.  Last echocardiogram with EF of 45%.  Sees cardiology q 6 months.  Last note reviewed.    OA Pt states she is having pain in hands and feet and wants to know if she can be helped by going to Rheumatology.  Wondering if swelling is contributing to her pain.    Relevant past medical, surgical, family and social history reviewed and updated as indicated. Interim medical history since our last visit reviewed. Allergies and medications reviewed and updated.  Review of Systems  Per HPI unless specifically indicated above     Objective:    BP 126/79 mmHg  Pulse 102  Temp(Src) 98.2 F (36.8 C)  Ht 5' 0.8" (1.544 m)  Wt 154 lb 6.4 oz (70.035 kg)  BMI 29.38 kg/m2  SpO2 95%  LMP  (LMP Unknown)  Wt Readings from Last 3 Encounters:  04/21/16 154 lb 6.4 oz (70.035 kg)  03/22/16 155 lb 3.2 oz (70.398 kg)  01/07/16 150 lb 12.8 oz (68.402 kg)    Physical Exam  Constitutional: She is oriented to person, place, and time. She appears well-developed and well-nourished. No distress.  HENT:  Head: Normocephalic and atraumatic.  Eyes: Conjunctivae and lids are normal. Right eye exhibits no discharge. Left eye exhibits no discharge. No scleral icterus.  Cardiovascular: Normal rate.  An irregularly irregular rhythm present.  Murmur heard.  Systolic  murmur is present with a grade of 2/6  Pulmonary/Chest: Effort normal.  Abdominal: Normal appearance. There is no splenomegaly or hepatomegaly.  Musculoskeletal: Normal range of motion.       Right ankle: She exhibits swelling.       Left ankle: She exhibits swelling.  Neurological: She is alert and oriented to person, place, and time.  Skin: Skin is intact. No rash noted. No pallor.  Psychiatric: She has a normal mood and affect. Her behavior is normal. Judgment and thought content normal.   Reviewed last echocardiogram is 45% done last fall  Results for orders placed or performed in visit on 03/22/16  Comprehensive metabolic panel  Result Value Ref Range   Glucose 117 (H) 65 - 99 mg/dL   BUN 11 8 - 27 mg/dL   Creatinine, Ser 0.66 0.57 - 1.00 mg/dL   GFR calc non Af Amer 81 >59 mL/min/1.73   GFR calc Af Amer 93 >59 mL/min/1.73   BUN/Creatinine Ratio 17 12 - 28   Sodium 143 134 - 144 mmol/L   Potassium 3.7 3.5 - 5.2 mmol/L   Chloride 98 96 - 106 mmol/L   CO2 28 18 - 29 mmol/L   Calcium 9.5 8.7 - 10.3 mg/dL   Total Protein 6.8 6.0 -  8.5 g/dL   Albumin 4.2 3.5 - 4.7 g/dL   Globulin, Total 2.6 1.5 - 4.5 g/dL   Albumin/Globulin Ratio 1.6 1.2 - 2.2   Bilirubin Total 0.5 0.0 - 1.2 mg/dL   Alkaline Phosphatase 67 39 - 117 IU/L   AST 13 0 - 40 IU/L   ALT 9 0 - 32 IU/L  Lipid Panel w/o Chol/HDL Ratio  Result Value Ref Range   Cholesterol, Total 131 100 - 199 mg/dL   Triglycerides 104 0 - 149 mg/dL   HDL 56 >39 mg/dL   VLDL Cholesterol Cal 21 5 - 40 mg/dL   LDL Calculated 54 0 - 99 mg/dL      Assessment & Plan:   Problem List Items Addressed This Visit      Unprioritized   Ankle edema   Relevant Medications   furosemide (LASIX) 20 MG tablet   potassium chloride SA (K-DUR,KLOR-CON) 20 MEQ tablet   Chronic atrial fibrillation (HCC)    Per Dr. Josefa Half      Relevant Medications   lisinopril (PRINIVIL,ZESTRIL) 20 MG tablet   furosemide (LASIX) 20 MG tablet   Edema -  Primary   Relevant Medications   furosemide (LASIX) 20 MG tablet   Essential hypertension    stable      Relevant Medications   lisinopril (PRINIVIL,ZESTRIL) 20 MG tablet   furosemide (LASIX) 20 MG tablet    Other Visit Diagnoses    Arthritis, multiple joint involvement        Relevant Orders    Rheumatoid Factor    ANA w/Reflex    C-reactive protein    Sed Rate (ESR)       Stop Lisinopril HCTZ and take plain Lisinopril Start Furosemide 20 mg daily -for fluid Start KCL daily for potassium replacement May need another echocardiogram   Follow up plan: Return in about 4 weeks (around 05/19/2016).

## 2016-04-21 NOTE — Assessment & Plan Note (Signed)
Per Dr. Parachos 

## 2016-04-22 ENCOUNTER — Ambulatory Visit: Payer: Medicare HMO

## 2016-04-22 LAB — SEDIMENTATION RATE: Sed Rate: 17 mm/hr (ref 0–40)

## 2016-04-22 LAB — ANA W/REFLEX: ANA: NEGATIVE

## 2016-04-22 LAB — RHEUMATOID FACTOR

## 2016-04-22 LAB — C-REACTIVE PROTEIN: CRP: 12.2 mg/L — ABNORMAL HIGH (ref 0.0–4.9)

## 2016-04-23 ENCOUNTER — Encounter: Payer: Self-pay | Admitting: Unknown Physician Specialty

## 2016-04-29 ENCOUNTER — Ambulatory Visit
Admission: RE | Admit: 2016-04-29 | Discharge: 2016-04-29 | Disposition: A | Discharge status: Home / Self Care | Payer: Medicare Other | Source: Ambulatory Visit | Attending: Unknown Physician Specialty | Admitting: Unknown Physician Specialty

## 2016-04-29 DIAGNOSIS — M85852 Other specified disorders of bone density and structure, left thigh: Secondary | ICD-10-CM | POA: Diagnosis not present

## 2016-04-29 DIAGNOSIS — E2839 Other primary ovarian failure: Secondary | ICD-10-CM | POA: Diagnosis present

## 2016-04-29 DIAGNOSIS — M8588 Other specified disorders of bone density and structure, other site: Secondary | ICD-10-CM | POA: Diagnosis not present

## 2016-04-29 DIAGNOSIS — Z1231 Encounter for screening mammogram for malignant neoplasm of breast: Secondary | ICD-10-CM | POA: Diagnosis not present

## 2016-04-29 DIAGNOSIS — M85832 Other specified disorders of bone density and structure, left forearm: Secondary | ICD-10-CM | POA: Insufficient documentation

## 2016-04-30 ENCOUNTER — Encounter: Payer: Self-pay | Admitting: Unknown Physician Specialty

## 2016-05-13 ENCOUNTER — Other Ambulatory Visit: Payer: Self-pay

## 2016-05-13 DIAGNOSIS — M25473 Effusion, unspecified ankle: Secondary | ICD-10-CM

## 2016-05-13 DIAGNOSIS — R601 Generalized edema: Secondary | ICD-10-CM

## 2016-05-14 MED ORDER — TRAZODONE HCL 50 MG PO TABS: 25.0000 mg | ORAL_TABLET | Freq: Every evening | ORAL | Status: DC | PRN

## 2016-05-14 MED ORDER — FUROSEMIDE 20 MG PO TABS: 20.0000 mg | ORAL_TABLET | Freq: Every day | ORAL | Status: DC

## 2016-05-14 MED ORDER — APIXABAN 2.5 MG PO TABS: 2.5000 mg | ORAL_TABLET | Freq: Two times a day (BID) | ORAL | Status: DC

## 2016-05-20 ENCOUNTER — Telehealth: Payer: Self-pay | Admitting: Unknown Physician Specialty

## 2016-05-20 NOTE — Telephone Encounter (Signed)
Pt called stated [facility] is waiting to speak with Elnita Maxwellheryl before pt's medication can be released to her. Epic shows Pharmacy confirmed receipt on 05/14/16. [facility] Patient stated she is almost out of this medication. Please follow up with [facility] ASAP. Thanks.

## 2016-05-20 NOTE — Telephone Encounter (Signed)
Called and let patient know that issue was resolved and medication would be sent to her today.

## 2016-05-20 NOTE — Telephone Encounter (Signed)
Called pharmacy. They stated that all they needed was to verify the collaborating physician for Elnita MaxwellCheryl so I gave them Dr. Henriette CombsJohnson's name and NPI number. Pharmacy tech stated she was documenting this and that the medication would be shipped out to the patient today.

## 2016-05-24 ENCOUNTER — Encounter: Payer: Self-pay | Admitting: Unknown Physician Specialty

## 2016-05-24 ENCOUNTER — Ambulatory Visit (INDEPENDENT_AMBULATORY_CARE_PROVIDER_SITE_OTHER): Payer: Medicare Other | Admitting: Unknown Physician Specialty

## 2016-05-24 VITALS — BP 141/85 | HR 97 | Temp 98.5°F | Ht 60.9 in | Wt 161.0 lb

## 2016-05-24 DIAGNOSIS — R601 Generalized edema: Secondary | ICD-10-CM | POA: Diagnosis not present

## 2016-05-24 DIAGNOSIS — R609 Edema, unspecified: Secondary | ICD-10-CM

## 2016-05-24 DIAGNOSIS — Z5181 Encounter for therapeutic drug level monitoring: Secondary | ICD-10-CM

## 2016-05-24 DIAGNOSIS — M25473 Effusion, unspecified ankle: Secondary | ICD-10-CM

## 2016-05-24 MED ORDER — FUROSEMIDE 20 MG PO TABS: 20.0000 mg | ORAL_TABLET | Freq: Every day | ORAL | Status: DC

## 2016-05-24 MED ORDER — POTASSIUM CHLORIDE CRYS ER 20 MEQ PO TBCR: 20.0000 meq | EXTENDED_RELEASE_TABLET | Freq: Every day | ORAL | Status: DC

## 2016-05-24 NOTE — Progress Notes (Signed)
BP 141/85 mmHg  Pulse 97  Temp(Src) 98.5 F (36.9 C)  Ht 5' 0.9" (1.547 m)  Wt 161 lb (73.029 kg)  BMI 30.52 kg/m2  SpO2 95%  LMP  (LMP Unknown)   Subjective:    Patient ID: Melinda Preston, female    DOB: 1928/07/31, 82 y.o.   MRN: [id number]  HPI: Melinda Preston is a 82 y.o. female  Chief Complaint  Patient presents with  . Edema    4 week f/u- pt states she has been out of lasix for about 4 days   Pt here for a follow up from last week in which the following interventions were made: Stop Lisinopril HCTZ and take plain Lisinopril Start Furosemide 20 mg daily -for fluid Start KCL daily for potassium replacement  States the Lasix helped but has been out of it for 4 days and waiting for the pharmacy to send it to her.  States she needs a refill of all her medications.    Relevant past medical, surgical, family and social history reviewed and updated as indicated. Interim medical history since our last visit reviewed. Allergies and medications reviewed and updated.  Review of Systems  Per HPI unless specifically indicated above     Objective:    BP 141/85 mmHg  Pulse 97  Temp(Src) 98.5 F (36.9 C)  Ht 5' 0.9" (1.547 m)  Wt 161 lb (73.029 kg)  BMI 30.52 kg/m2  SpO2 95%  LMP  (LMP Unknown)  Wt Readings from Last 3 Encounters:  05/24/16 161 lb (73.029 kg)  04/21/16 154 lb 6.4 oz (70.035 kg)  03/22/16 155 lb 3.2 oz (70.398 kg)    Physical Exam  Constitutional: She is oriented to person, place, and time. She appears well-developed and well-nourished. No distress.  HENT:  Head: Normocephalic and atraumatic.  Eyes: Conjunctivae and lids are normal. Right eye exhibits no discharge. Left eye exhibits no discharge. No scleral icterus.  Neck: Normal range of motion. Neck supple. No JVD present. Carotid bruit is not present.  Cardiovascular: Normal heart sounds.  An irregularly irregular rhythm present.  Bilateral 3 plus leg edema  Pulmonary/Chest:  Effort normal and breath sounds normal.  Abdominal: Normal appearance. There is no splenomegaly or hepatomegaly.  Musculoskeletal: Normal range of motion.  Neurological: She is alert and oriented to person, place, and time.  Skin: Skin is warm, dry and intact. No rash noted. No pallor.  Psychiatric: She has a normal mood and affect. Her behavior is normal. Judgment and thought content normal.    Results for orders placed or performed in visit on 04/21/16  Rheumatoid Factor  Result Value Ref Range   Rhuematoid fact SerPl-aCnc <10.0 0.0 - 13.9 IU/mL  ANA w/Reflex  Result Value Ref Range   Anit Nuclear Antibody(ANA) Negative Negative  C-reactive protein  Result Value Ref Range   CRP 12.2 (H) 0.0 - 4.9 mg/L  Sed Rate (ESR)  Result Value Ref Range   Sed Rate 17 0 - 40 mm/hr      Assessment & Plan:   Problem List Items Addressed This Visit      Unprioritized   Ankle edema - Primary   Relevant Medications   potassium chloride SA (K-DUR,KLOR-CON) 20 MEQ tablet   furosemide (LASIX) 20 MG tablet   Other Relevant Orders   Comprehensive metabolic panel   Edema   Relevant Medications   furosemide (LASIX) 20 MG tablet    Other Visit Diagnoses    Encounter for medication monitoring  Relevant Orders    Comprehensive metabolic panel        Follow up plan: Return in about 3 months (around 08/24/2016).

## 2016-05-25 LAB — COMPREHENSIVE METABOLIC PANEL
A/G RATIO: 1.7 (ref 1.2–2.2)
ALBUMIN: 4.3 g/dL (ref 3.5–4.7)
ALK PHOS: 62 IU/L (ref 39–117)
ALT: 11 IU/L (ref 0–32)
AST: 16 IU/L (ref 0–40)
BILIRUBIN TOTAL: 0.5 mg/dL (ref 0.0–1.2)
BUN / CREAT RATIO: 22 (ref 12–28)
BUN: 14 mg/dL (ref 8–27)
CHLORIDE: 100 mmol/L (ref 96–106)
CO2: 26 mmol/L (ref 18–29)
Calcium: 9.8 mg/dL (ref 8.7–10.3)
Creatinine, Ser: 0.64 mg/dL (ref 0.57–1.00)
GFR calc non Af Amer: 82 mL/min/{1.73_m2} (ref >59)
GFR, EST AFRICAN AMERICAN: 94 mL/min/{1.73_m2} (ref >59)
GLUCOSE: 109 mg/dL — AB (ref 65–99)
Globulin, Total: 2.6 g/dL (ref 1.5–4.5)
POTASSIUM: 4.9 mmol/L (ref 3.5–5.2)
SODIUM: 142 mmol/L (ref 134–144)
TOTAL PROTEIN: 6.9 g/dL (ref 6.0–8.5)

## 2016-06-25 ENCOUNTER — Other Ambulatory Visit: Payer: Self-pay | Admitting: Unknown Physician Specialty

## 2016-07-02 ENCOUNTER — Other Ambulatory Visit: Payer: Self-pay | Admitting: Unknown Physician Specialty

## 2016-07-02 DIAGNOSIS — R601 Generalized edema: Secondary | ICD-10-CM

## 2016-07-02 DIAGNOSIS — M25473 Effusion, unspecified ankle: Secondary | ICD-10-CM

## 2016-07-02 NOTE — Telephone Encounter (Signed)
Your patient 

## 2016-07-09 DIAGNOSIS — M170 Bilateral primary osteoarthritis of knee: Secondary | ICD-10-CM | POA: Diagnosis not present

## 2016-07-09 DIAGNOSIS — M25561 Pain in right knee: Secondary | ICD-10-CM | POA: Diagnosis not present

## 2016-07-09 DIAGNOSIS — M25562 Pain in left knee: Secondary | ICD-10-CM | POA: Diagnosis not present

## 2016-07-09 DIAGNOSIS — G8929 Other chronic pain: Secondary | ICD-10-CM | POA: Diagnosis not present

## 2016-07-22 ENCOUNTER — Other Ambulatory Visit: Payer: Self-pay | Admitting: Unknown Physician Specialty

## 2016-07-22 DIAGNOSIS — E7800 Pure hypercholesterolemia, unspecified: Secondary | ICD-10-CM | POA: Diagnosis not present

## 2016-07-22 DIAGNOSIS — I350 Nonrheumatic aortic (valve) stenosis: Secondary | ICD-10-CM | POA: Diagnosis not present

## 2016-07-22 DIAGNOSIS — I10 Essential (primary) hypertension: Secondary | ICD-10-CM | POA: Diagnosis not present

## 2016-07-22 DIAGNOSIS — I481 Persistent atrial fibrillation: Secondary | ICD-10-CM | POA: Diagnosis not present

## 2016-07-25 ENCOUNTER — Other Ambulatory Visit: Payer: Self-pay | Admitting: Family Medicine

## 2016-08-11 DIAGNOSIS — I480 Paroxysmal atrial fibrillation: Secondary | ICD-10-CM | POA: Diagnosis not present

## 2016-08-14 ENCOUNTER — Other Ambulatory Visit: Payer: Self-pay | Admitting: Unknown Physician Specialty

## 2016-08-14 DIAGNOSIS — M25473 Effusion, unspecified ankle: Secondary | ICD-10-CM

## 2016-08-16 DIAGNOSIS — E7800 Pure hypercholesterolemia, unspecified: Secondary | ICD-10-CM | POA: Diagnosis not present

## 2016-08-16 DIAGNOSIS — I10 Essential (primary) hypertension: Secondary | ICD-10-CM | POA: Diagnosis not present

## 2016-08-16 DIAGNOSIS — I481 Persistent atrial fibrillation: Secondary | ICD-10-CM | POA: Diagnosis not present

## 2016-08-16 DIAGNOSIS — I5021 Acute systolic (congestive) heart failure: Secondary | ICD-10-CM | POA: Diagnosis not present

## 2016-08-16 DIAGNOSIS — I350 Nonrheumatic aortic (valve) stenosis: Secondary | ICD-10-CM | POA: Diagnosis not present

## 2016-08-23 ENCOUNTER — Other Ambulatory Visit: Payer: Self-pay

## 2016-08-23 DIAGNOSIS — M25473 Effusion, unspecified ankle: Secondary | ICD-10-CM

## 2016-08-23 MED ORDER — POTASSIUM CHLORIDE CRYS ER 20 MEQ PO TBCR: 20.0000 meq | EXTENDED_RELEASE_TABLET | Freq: Every day | ORAL | 3 refills | Status: DC

## 2016-08-23 NOTE — Telephone Encounter (Signed)
Called pharmacy because according to chart, this medication should not run out until July 2018. Pharmacy said that they did not receive rx sent in July.

## 2016-08-24 ENCOUNTER — Encounter: Payer: Self-pay | Admitting: Unknown Physician Specialty

## 2016-08-24 ENCOUNTER — Ambulatory Visit (INDEPENDENT_AMBULATORY_CARE_PROVIDER_SITE_OTHER): Payer: Medicare Other | Admitting: Unknown Physician Specialty

## 2016-08-24 VITALS — BP 136/74 | HR 98 | Ht 60.4 in | Wt 152.6 lb

## 2016-08-24 DIAGNOSIS — I5022 Chronic systolic (congestive) heart failure: Secondary | ICD-10-CM | POA: Diagnosis not present

## 2016-08-24 DIAGNOSIS — I482 Chronic atrial fibrillation, unspecified: Secondary | ICD-10-CM

## 2016-08-24 DIAGNOSIS — R601 Generalized edema: Secondary | ICD-10-CM | POA: Diagnosis not present

## 2016-08-24 DIAGNOSIS — M25473 Effusion, unspecified ankle: Secondary | ICD-10-CM

## 2016-08-24 DIAGNOSIS — I10 Essential (primary) hypertension: Secondary | ICD-10-CM | POA: Diagnosis not present

## 2016-08-24 MED ORDER — BENZONATATE 100 MG PO CAPS: 100.0000 mg | ORAL_CAPSULE | Freq: Two times a day (BID) | ORAL | 2 refills | Status: DC | PRN

## 2016-08-24 NOTE — Assessment & Plan Note (Signed)
Following up with cardiology in a few weeks. Suggested she wear her compression stockings.

## 2016-08-24 NOTE — Assessment & Plan Note (Signed)
Seeing cardiologist again in a few weeks. Suggested she wear her compression stockings.

## 2016-08-24 NOTE — Progress Notes (Signed)
BP 136/74 (BP Location: Left Arm, Cuff Size: Large)   Pulse 98   Ht 5' 0.4" (1.534 m)   Wt 152 lb 9.6 oz (69.2 kg)   LMP  (LMP Unknown)   SpO2 97%   BMI 29.41 kg/m    Subjective:    Patient ID: Melinda Preston, female    DOB: 1928-04-13, 82 y.o.   MRN: [id number]  HPI: Melinda Preston is a 82 y.o. female  Chief Complaint  Patient presents with  . Hypertension  . Hyperlipidemia  . Edema   Hypertension  Using medications without difficulty  Average home BPs Not checking   Using medication without problems or lightheadedness  No chest pain with exertion. Shortness of breath especially when lying down at night.  Elevated Cholesterol Using medications without problems No Muscle aches  Diet: good Exercise: not exercising   Edema Echo revealed EF of 30%  Cardiologist increased Lasix to 40 mg 07/22/16 Edema subsides at night. Also started her on Benzonatate for cough, which she is already out of and reports it wasn't very helpful.  She has a follow up appointment with cardiologist on 09/06/16    Relevant past medical, surgical, family and social history reviewed and updated as indicated. Interim medical history since our last visit reviewed. Allergies and medications reviewed and updated.  Review of Systems  Constitutional: Negative for fatigue.  Respiratory: Positive for cough. Negative for shortness of breath.   Cardiovascular: Positive for leg swelling. Negative for chest pain.  Neurological: Negative for dizziness and light-headedness.    Per HPI unless specifically indicated above     Objective:    BP 136/74 (BP Location: Left Arm, Cuff Size: Large)   Pulse 98   Ht 5' 0.4" (1.534 m)   Wt 152 lb 9.6 oz (69.2 kg)   LMP  (LMP Unknown)   SpO2 97%   BMI 29.41 kg/m   Wt Readings from Last 3 Encounters:  08/24/16 152 lb 9.6 oz (69.2 kg)  05/24/16 161 lb (73 kg)  04/21/16 154 lb 6.4 oz (70 kg)    Physical Exam  Constitutional: She is oriented  to person, place, and time. She appears well-developed and well-nourished. No distress.  Cardiovascular: Normal rate.  An irregularly irregular rhythm present.  Murmur heard. Pulmonary/Chest: No respiratory distress.  Musculoskeletal: She exhibits edema.       Right ankle: She exhibits swelling.       Left ankle: She exhibits swelling.  Neurological: She is alert and oriented to person, place, and time.  Psychiatric: She has a normal mood and affect. Her behavior is normal. Judgment and thought content normal.    Results for orders placed or performed in visit on 05/24/16  Comprehensive metabolic panel  Result Value Ref Range   Glucose 109 (H) 65 - 99 mg/dL   BUN 14 8 - 27 mg/dL   Creatinine, Ser 4.09 0.57 - 1.00 mg/dL   GFR calc non Af Amer 82 >59 mL/min/1.73   GFR calc Af Amer 94 >59 mL/min/1.73   BUN/Creatinine Ratio 22 12 - 28   Sodium 142 134 - 144 mmol/L   Potassium 4.9 3.5 - 5.2 mmol/L   Chloride 100 96 - 106 mmol/L   CO2 26 18 - 29 mmol/L   Calcium 9.8 8.7 - 10.3 mg/dL   Total Protein 6.9 6.0 - 8.5 g/dL   Albumin 4.3 3.5 - 4.7 g/dL   Globulin, Total 2.6 1.5 - 4.5 g/dL   Albumin/Globulin Ratio 1.7 1.2 -  2.2   Bilirubin Total 0.5 0.0 - 1.2 mg/dL   Alkaline Phosphatase 62 39 - 117 IU/L   AST 16 0 - 40 IU/L   ALT 11 0 - 32 IU/L      Assessment & Plan:   Problem List Items Addressed This Visit      Cardiovascular and Mediastinum   Essential hypertension    Stable, continue current medications.       Relevant Medications   carvedilol (COREG) 3.125 MG tablet   Chronic atrial fibrillation (HCC)    Stable, continue current medications.       Relevant Medications   carvedilol (COREG) 3.125 MG tablet     Other   Edema    Following up with cardiology in a few weeks. Suggested she wear her compression stockings.       Ankle edema    Seeing cardiologist again in a few weeks. Suggested she wear her compression stockings.       Other Visit Diagnoses    None.      Follow up plan: No Follow-up on file.

## 2016-08-24 NOTE — Assessment & Plan Note (Signed)
Stable, continue current medications.  

## 2016-08-25 ENCOUNTER — Encounter: Payer: Self-pay | Admitting: Unknown Physician Specialty

## 2016-08-25 LAB — COMPREHENSIVE METABOLIC PANEL
A/G RATIO: 1.9 (ref 1.2–2.2)
ALT: 25 IU/L (ref 0–32)
AST: 18 IU/L (ref 0–40)
Albumin: 4.3 g/dL (ref 3.5–4.7)
Alkaline Phosphatase: 59 IU/L (ref 39–117)
BILIRUBIN TOTAL: 0.8 mg/dL (ref 0.0–1.2)
BUN/Creatinine Ratio: 15 (ref 12–28)
BUN: 12 mg/dL (ref 8–27)
CALCIUM: 9.5 mg/dL (ref 8.7–10.3)
CO2: 28 mmol/L (ref 18–29)
Chloride: 96 mmol/L (ref 96–106)
Creatinine, Ser: 0.78 mg/dL (ref 0.57–1.00)
GFR, EST AFRICAN AMERICAN: 80 mL/min/{1.73_m2} (ref >59)
GFR, EST NON AFRICAN AMERICAN: 69 mL/min/{1.73_m2} (ref >59)
GLOBULIN, TOTAL: 2.3 g/dL (ref 1.5–4.5)
Glucose: 117 mg/dL — ABNORMAL HIGH (ref 65–99)
POTASSIUM: 4 mmol/L (ref 3.5–5.2)
SODIUM: 142 mmol/L (ref 134–144)
Total Protein: 6.6 g/dL (ref 6.0–8.5)

## 2016-09-06 DIAGNOSIS — I5021 Acute systolic (congestive) heart failure: Secondary | ICD-10-CM | POA: Diagnosis not present

## 2016-09-06 DIAGNOSIS — I350 Nonrheumatic aortic (valve) stenosis: Secondary | ICD-10-CM | POA: Diagnosis not present

## 2016-09-06 DIAGNOSIS — I481 Persistent atrial fibrillation: Secondary | ICD-10-CM | POA: Diagnosis not present

## 2016-09-06 DIAGNOSIS — I429 Cardiomyopathy, unspecified: Secondary | ICD-10-CM | POA: Diagnosis not present

## 2016-09-06 DIAGNOSIS — I10 Essential (primary) hypertension: Secondary | ICD-10-CM | POA: Diagnosis not present

## 2016-09-06 DIAGNOSIS — Z79899 Other long term (current) drug therapy: Secondary | ICD-10-CM | POA: Diagnosis not present

## 2016-09-17 ENCOUNTER — Ambulatory Visit (INDEPENDENT_AMBULATORY_CARE_PROVIDER_SITE_OTHER): Payer: Medicare Other | Admitting: Unknown Physician Specialty

## 2016-09-17 ENCOUNTER — Encounter: Payer: Self-pay | Admitting: Unknown Physician Specialty

## 2016-09-17 VITALS — BP 126/85 | HR 53 | Temp 98.0°F | Ht 60.7 in | Wt 154.8 lb

## 2016-09-17 DIAGNOSIS — R05 Cough: Secondary | ICD-10-CM

## 2016-09-17 DIAGNOSIS — R059 Cough, unspecified: Secondary | ICD-10-CM

## 2016-09-17 DIAGNOSIS — Z23 Encounter for immunization: Secondary | ICD-10-CM | POA: Diagnosis not present

## 2016-09-17 MED ORDER — VALSARTAN 80 MG PO TABS: 80.0000 mg | ORAL_TABLET | Freq: Every day | ORAL | 2 refills | Status: DC

## 2016-09-17 MED ORDER — AZITHROMYCIN 250 MG PO TABS: ORAL_TABLET | ORAL | 0 refills | Status: DC

## 2016-09-17 MED ORDER — GUAIFENESIN-CODEINE 100-10 MG/5ML PO SOLN: 10.0000 mL | Freq: Three times a day (TID) | ORAL | 0 refills | Status: DC | PRN

## 2016-09-17 NOTE — Patient Instructions (Addendum)

## 2016-09-17 NOTE — Progress Notes (Signed)
BP 126/85 (BP Location: Left Arm, Cuff Size: Large)   Pulse (!) 53   Temp 98 F (36.7 C)   Ht 5' 0.7" (1.542 m)   Wt 154 lb 12.8 oz (70.2 kg)   LMP  (LMP Unknown)   SpO2 96%   BMI 29.54 kg/m    Subjective:    Patient ID: Melinda Preston, female    DOB: 13-Mar-1928, 82 y.o.   MRN: [id number]  HPI: Melinda OreBlanche Ector Jeanpaul is a 82 y.o. female  Chief Complaint  Patient presents with  . Cough    pt states she has had a cough for about a month, patient states she has been taking Coricidin HBP  . Shortness of Breath    pt states she has been feeling short of breath for about 4 weeks   Cough Pt has had a cough for about 1 month.  No change in BP medication.  States she is coughing up white sputum.  No fever.  States she was awake since 2A.  States she is experiencing SOB.  She is taking LawyerTessalon Perles through Dr. Cassie FreerParachos  without any help but coughed up more mucous. Coricidin helped.    Relevant past medical, surgical, family and social history reviewed and updated as indicated. Interim medical history since our last visit reviewed. Allergies and medications reviewed and updated.  Review of Systems  Per HPI unless specifically indicated above     Objective:    BP 126/85 (BP Location: Left Arm, Cuff Size: Large)   Pulse (!) 53   Temp 98 F (36.7 C)   Ht 5' 0.7" (1.542 m)   Wt 154 lb 12.8 oz (70.2 kg)   LMP  (LMP Unknown)   SpO2 96%   BMI 29.54 kg/m   Wt Readings from Last 3 Encounters:  09/17/16 154 lb 12.8 oz (70.2 kg)  08/24/16 152 lb 9.6 oz (69.2 kg)  05/24/16 161 lb (73 kg)    Physical Exam  Constitutional: She is oriented to person, place, and time. She appears well-developed and well-nourished. No distress.  HENT:  Head: Normocephalic and atraumatic.  Eyes: Conjunctivae and lids are normal. Right eye exhibits no discharge. Left eye exhibits no discharge. No scleral icterus.  Neck: Normal range of motion. Neck supple. No JVD present. Carotid bruit is not  present.  Cardiovascular: Normal rate and normal heart sounds.  An irregularly irregular rhythm present.  Pulmonary/Chest: Effort normal and breath sounds normal.  Abdominal: Normal appearance. There is no splenomegaly or hepatomegaly.  Musculoskeletal: Normal range of motion.  Neurological: She is alert and oriented to person, place, and time.  Skin: Skin is warm, dry and intact. No rash noted. No pallor.  Psychiatric: She has a normal mood and affect. Her behavior is normal. Judgment and thought content normal.    Results for orders placed or performed in visit on 08/24/16  Comprehensive metabolic panel  Result Value Ref Range   Glucose 117 (H) 65 - 99 mg/dL   BUN 12 8 - 27 mg/dL   Creatinine, Ser 4.090.78 0.57 - 1.00 mg/dL   GFR calc non Af Amer 69 >59 mL/min/1.73   GFR calc Af Amer 80 >59 mL/min/1.73   BUN/Creatinine Ratio 15 12 - 28   Sodium 142 134 - 144 mmol/L   Potassium 4.0 3.5 - 5.2 mmol/L   Chloride 96 96 - 106 mmol/L   CO2 28 18 - 29 mmol/L   Calcium 9.5 8.7 - 10.3 mg/dL   Total Protein 6.6  6.0 - 8.5 g/dL   Albumin 4.3 3.5 - 4.7 g/dL   Globulin, Total 2.3 1.5 - 4.5 g/dL   Albumin/Globulin Ratio 1.9 1.2 - 2.2   Bilirubin Total 0.8 0.0 - 1.2 mg/dL   Alkaline Phosphatase 59 39 - 117 IU/L   AST 18 0 - 40 IU/L   ALT 25 0 - 32 IU/L      Assessment & Plan:   Problem List Items Addressed This Visit    None    Visit Diagnoses    Need for influenza vaccination    -  Primary   Relevant Orders   Flu vaccine HIGH DOSE PF (Completed)   Cough       Cause is unclear.  Will stop Lisinopril and change to ARB.  Get chest x-ray.  Rx for Z pack to rule out atypical   Relevant Orders   DG Chest 2 View      Written instruction given  Follow up plan: Return for next week.

## 2016-09-21 ENCOUNTER — Ambulatory Visit
Admission: RE | Admit: 2016-09-21 | Discharge: 2016-09-21 | Disposition: A | Discharge status: Home / Self Care | Payer: Medicare Other | Source: Ambulatory Visit | Attending: Unknown Physician Specialty | Admitting: Unknown Physician Specialty

## 2016-09-21 DIAGNOSIS — K449 Diaphragmatic hernia without obstruction or gangrene: Secondary | ICD-10-CM | POA: Insufficient documentation

## 2016-09-21 DIAGNOSIS — R05 Cough: Secondary | ICD-10-CM | POA: Insufficient documentation

## 2016-09-21 DIAGNOSIS — I700 Atherosclerosis of aorta: Secondary | ICD-10-CM | POA: Diagnosis not present

## 2016-09-21 DIAGNOSIS — J9811 Atelectasis: Secondary | ICD-10-CM | POA: Insufficient documentation

## 2016-09-21 DIAGNOSIS — R0602 Shortness of breath: Secondary | ICD-10-CM | POA: Diagnosis not present

## 2016-09-21 DIAGNOSIS — J90 Pleural effusion, not elsewhere classified: Secondary | ICD-10-CM | POA: Insufficient documentation

## 2016-09-21 DIAGNOSIS — M419 Scoliosis, unspecified: Secondary | ICD-10-CM | POA: Insufficient documentation

## 2016-09-21 DIAGNOSIS — R059 Cough, unspecified: Secondary | ICD-10-CM

## 2016-09-24 ENCOUNTER — Encounter: Payer: Self-pay | Admitting: Unknown Physician Specialty

## 2016-09-24 ENCOUNTER — Ambulatory Visit (INDEPENDENT_AMBULATORY_CARE_PROVIDER_SITE_OTHER): Payer: Medicare Other | Admitting: Unknown Physician Specialty

## 2016-09-24 VITALS — BP 143/98 | HR 94 | Temp 97.5°F | Wt 155.2 lb

## 2016-09-24 DIAGNOSIS — J189 Pneumonia, unspecified organism: Secondary | ICD-10-CM | POA: Diagnosis not present

## 2016-09-24 MED ORDER — PREDNISONE 20 MG PO TABS: 40.0000 mg | ORAL_TABLET | Freq: Every day | ORAL | 0 refills | Status: DC

## 2016-09-24 MED ORDER — AMOXICILLIN 875 MG PO TABS: 875.0000 mg | ORAL_TABLET | Freq: Two times a day (BID) | ORAL | 0 refills | Status: DC

## 2016-09-24 NOTE — Progress Notes (Signed)
BP (!) 143/98 (BP Location: Left Arm, Cuff Size: Normal)   Pulse 94   Temp 97.5 F (36.4 C)   Wt 155 lb 3.2 oz (70.4 kg)   LMP  (LMP Unknown)   SpO2 94%   BMI 29.62 kg/m    Subjective:    Patient ID: Melinda Preston, female    DOB: [birth year], 82 y.o.   MRN: [id number]  HPI: Melinda Preston is a 82 y.o. female  Chief Complaint  Patient presents with  . Follow-up    1 week, patient states she is better but still a little short winded   Cough Pt's cough is improved.  She has completed the Z-pack and feels better but still SOB.  States she feels like she is choking at times while lying down.  X-ray suggests possibility of a pneumonitis.    Relevant past medical, surgical, family and social history reviewed and updated as indicated. Interim medical history since our last visit reviewed. Allergies and medications reviewed and updated.  Review of Systems  Per HPI unless specifically indicated above     Objective:    BP (!) 143/98 (BP Location: Left Arm, Cuff Size: Normal)   Pulse 94   Temp 97.5 F (36.4 C)   Wt 155 lb 3.2 oz (70.4 kg)   LMP  (LMP Unknown)   SpO2 94%   BMI 29.62 kg/m   Wt Readings from Last 3 Encounters:  09/24/16 155 lb 3.2 oz (70.4 kg)  09/17/16 154 lb 12.8 oz (70.2 kg)  08/24/16 152 lb 9.6 oz (69.2 kg)    Physical Exam  Constitutional: She is oriented to person, place, and time. She appears well-developed and well-nourished. No distress.  HENT:  Head: Normocephalic and atraumatic.  Eyes: Conjunctivae and lids are normal. Right eye exhibits no discharge. Left eye exhibits no discharge. No scleral icterus.  Neck: Normal range of motion. Neck supple. No JVD present. Carotid bruit is not present.  Cardiovascular: An irregularly irregular rhythm present.  Pulmonary/Chest: Effort normal. She has decreased breath sounds in the left lower field. She has rhonchi in the right lower field.  Abdominal: Normal appearance. There is no  splenomegaly or hepatomegaly.  Musculoskeletal: Normal range of motion.  Neurological: She is alert and oriented to person, place, and time.  Skin: Skin is warm, dry and intact. No rash noted. No pallor.  Psychiatric: She has a normal mood and affect. Her behavior is normal. Judgment and thought content normal.    Results for orders placed or performed in visit on 08/24/16  Comprehensive metabolic panel  Result Value Ref Range   Glucose 117 (H) 65 - 99 mg/dL   BUN 12 8 - 27 mg/dL   Creatinine, Ser 4.780.78 0.57 - 1.00 mg/dL   GFR calc non Af Amer 69 >59 mL/min/1.73   GFR calc Af Amer 80 >59 mL/min/1.73   BUN/Creatinine Ratio 15 12 - 28   Sodium 142 134 - 144 mmol/L   Potassium 4.0 3.5 - 5.2 mmol/L   Chloride 96 96 - 106 mmol/L   CO2 28 18 - 29 mmol/L   Calcium 9.5 8.7 - 10.3 mg/dL   Total Protein 6.6 6.0 - 8.5 g/dL   Albumin 4.3 3.5 - 4.7 g/dL   Globulin, Total 2.3 1.5 - 4.5 g/dL   Albumin/Globulin Ratio 1.9 1.2 - 2.2   Bilirubin Total 0.8 0.0 - 1.2 mg/dL   Alkaline Phosphatase 59 39 - 117 IU/L   AST 18 0 - 40 IU/L  ALT 25 0 - 32 IU/L      Assessment & Plan:   Problem List Items Addressed This Visit    None    Visit Diagnoses    Pneumonitis    -  Primary   Add amoxicillin 875 mg BID.  Rx for Prednisone 40 mg to take QD       Follow up plan: Return in about 1 week (around 10/01/2016).

## 2016-10-01 ENCOUNTER — Ambulatory Visit (INDEPENDENT_AMBULATORY_CARE_PROVIDER_SITE_OTHER): Payer: Medicare Other | Admitting: Unknown Physician Specialty

## 2016-10-01 ENCOUNTER — Encounter: Payer: Self-pay | Admitting: Unknown Physician Specialty

## 2016-10-01 DIAGNOSIS — J189 Pneumonia, unspecified organism: Secondary | ICD-10-CM | POA: Diagnosis not present

## 2016-10-01 NOTE — Assessment & Plan Note (Addendum)
Slow improvement.  Discussed with pt long period of recovery of 2-3 months

## 2016-10-01 NOTE — Progress Notes (Signed)
BP 128/88 (BP Location: Left Arm, Patient Position: Sitting, Cuff Size: Large)   Pulse 76   Temp 98.1 F (36.7 C)   Wt 154 lb 3.2 oz (69.9 kg)   LMP  (LMP Unknown)   SpO2 95%   BMI 29.42 kg/m    Subjective:    Patient ID: Melinda Preston, female    DOB: 1928/06/22, 82 y.o.   MRN: [id number]  HPI: Melinda OreBlanche Ector Mahan is a 82 y.o. female  Chief Complaint  Patient presents with  . URI    1 week f/up   Cough Last visit we started her on Amoxil in addition to your Zpack.  Pt states her cough is getting better.  She slept last night and was better the night before.   She has stopped coughing up mucous. She does feel she is getting better.  We also changed her ACE to an ARB   Relevant past medical, surgical, family and social history reviewed and updated as indicated. Interim medical history since our last visit reviewed. Allergies and medications reviewed and updated.  Review of Systems  Per HPI unless specifically indicated above     Objective:    BP 128/88 (BP Location: Left Arm, Patient Position: Sitting, Cuff Size: Large)   Pulse 76   Temp 98.1 F (36.7 C)   Wt 154 lb 3.2 oz (69.9 kg)   LMP  (LMP Unknown)   SpO2 95%   BMI 29.42 kg/m   Wt Readings from Last 3 Encounters:  10/01/16 154 lb 3.2 oz (69.9 kg)  09/24/16 155 lb 3.2 oz (70.4 kg)  09/17/16 154 lb 12.8 oz (70.2 kg)    Physical Exam  Constitutional: She is oriented to person, place, and time. She appears well-developed and well-nourished. No distress.  HENT:  Head: Normocephalic and atraumatic.  Eyes: Conjunctivae and lids are normal. Right eye exhibits no discharge. Left eye exhibits no discharge. No scleral icterus.  Neck: Normal range of motion. Neck supple. No JVD present. Carotid bruit is not present.  Cardiovascular: Normal rate and normal heart sounds.  An irregularly irregular rhythm present.  Pulmonary/Chest: Effort normal. She has no decreased breath sounds. She has no wheezes. She has  rhonchi in the left lower field.  Abdominal: Normal appearance. There is no splenomegaly or hepatomegaly.  Musculoskeletal: Normal range of motion.  Neurological: She is alert and oriented to person, place, and time.  Skin: Skin is warm, dry and intact. No rash noted. No pallor.  Psychiatric: She has a normal mood and affect. Her behavior is normal. Judgment and thought content normal.    Results for orders placed or performed in visit on 08/24/16  Comprehensive metabolic panel  Result Value Ref Range   Glucose 117 (H) 65 - 99 mg/dL   BUN 12 8 - 27 mg/dL   Creatinine, Ser 4.090.78 0.57 - 1.00 mg/dL   GFR calc non Af Amer 69 >59 mL/min/1.73   GFR calc Af Amer 80 >59 mL/min/1.73   BUN/Creatinine Ratio 15 12 - 28   Sodium 142 134 - 144 mmol/L   Potassium 4.0 3.5 - 5.2 mmol/L   Chloride 96 96 - 106 mmol/L   CO2 28 18 - 29 mmol/L   Calcium 9.5 8.7 - 10.3 mg/dL   Total Protein 6.6 6.0 - 8.5 g/dL   Albumin 4.3 3.5 - 4.7 g/dL   Globulin, Total 2.3 1.5 - 4.5 g/dL   Albumin/Globulin Ratio 1.9 1.2 - 2.2   Bilirubin Total 0.8 0.0 -  1.2 mg/dL   Alkaline Phosphatase 59 39 - 117 IU/L   AST 18 0 - 40 IU/L   ALT 25 0 - 32 IU/L      Assessment & Plan:   Problem List Items Addressed This Visit      Unprioritized   Pneumonitis    Slow improvement.  Discussed with pt long period of recovery of 2-3 months          Follow up plan: Return in about 1 week (around 10/08/2016).

## 2016-10-06 ENCOUNTER — Telehealth: Payer: Self-pay | Admitting: Unknown Physician Specialty

## 2016-10-06 MED ORDER — PREDNISONE 20 MG PO TABS: 40.0000 mg | ORAL_TABLET | Freq: Every day | ORAL | 0 refills | Status: DC

## 2016-10-06 NOTE — Telephone Encounter (Signed)
Please let her know that I think the infection is resolved but what she has left over if inflammation in her lungs.  I would like to give her some prednisone to help.  Let her know to take both pills once a day for 5 days WITH FOOD and to let us know if it heps

## 2016-10-06 NOTE — Telephone Encounter (Signed)
Routing to provider  

## 2016-10-06 NOTE — Telephone Encounter (Signed)
I've been following her for this and she seemed to be getting better.  Is she not?

## 2016-10-06 NOTE — Telephone Encounter (Signed)
Called and let patient know what Elnita MaxwellCheryl said and Cheryl's instructions.

## 2016-10-06 NOTE — Telephone Encounter (Signed)
Pt called stated she has pneumonia and wants to know if something can be called in for. Pt has horrible cough and is producing mucus with her cough. [facility] [place]. Thanks.

## 2016-10-06 NOTE — Telephone Encounter (Signed)
Called and spoke to patient. She states that she was getting better but she has recently started having coughing spells in the morning and throughout the day and coughing up mucus. Patient states she has not gotten bad but not to good either.

## 2016-10-12 ENCOUNTER — Encounter: Payer: Self-pay | Admitting: Unknown Physician Specialty

## 2016-10-12 ENCOUNTER — Ambulatory Visit (INDEPENDENT_AMBULATORY_CARE_PROVIDER_SITE_OTHER): Payer: Medicare Other | Admitting: Unknown Physician Specialty

## 2016-10-12 VITALS — BP 126/92 | HR 101 | Temp 97.7°F | Wt 156.2 lb

## 2016-10-12 DIAGNOSIS — R05 Cough: Secondary | ICD-10-CM

## 2016-10-12 DIAGNOSIS — R059 Cough, unspecified: Secondary | ICD-10-CM

## 2016-10-12 DIAGNOSIS — J189 Pneumonia, unspecified organism: Secondary | ICD-10-CM

## 2016-10-12 NOTE — Progress Notes (Signed)
   BP (!) 126/92 (BP Location: Right Arm, Patient Position: Sitting, Cuff Size: Large)   Pulse (!) 101   Temp 97.7 F (36.5 C)   Wt 156 lb 3.2 oz (70.9 kg)   LMP  (LMP Unknown)   SpO2 96%   BMI 29.81 kg/m    Subjective:    Patient ID: Melinda Preston, female    DOB: 1928/08/14, 82 y.o.   MRN: [id number]  HPI: Melinda Preston is a 82 y.o. female  Chief Complaint  Patient presents with  . pneumonitis    1 week f/up- pt states she feels better in the evenings than in the mornings   States she is better, particularly in the evening, but it take 2 hours of coughing before she can get going.  Not spitting up mucous anymore.  Calls it a hacking and dry cough.  No fever.  She does get SOB at times.  She wonders if she needs more antibiotics to get over this.    Relevant past medical, surgical, family and social history reviewed and updated as indicated. Interim medical history since our last visit reviewed. Allergies and medications reviewed and updated.  Review of Systems  Per HPI unless specifically indicated above     Objective:    BP (!) 126/92 (BP Location: Right Arm, Patient Position: Sitting, Cuff Size: Large)   Pulse (!) 101   Temp 97.7 F (36.5 C)   Wt 156 lb 3.2 oz (70.9 kg)   LMP  (LMP Unknown)   SpO2 96%   BMI 29.81 kg/m   Wt Readings from Last 3 Encounters:  10/12/16 156 lb 3.2 oz (70.9 kg)  10/01/16 154 lb 3.2 oz (69.9 kg)  09/24/16 155 lb 3.2 oz (70.4 kg)    Physical Exam  Constitutional: She is oriented to person, place, and time. She appears well-developed and well-nourished. No distress.  HENT:  Head: Normocephalic and atraumatic.  Eyes: Conjunctivae and lids are normal. Right eye exhibits no discharge. Left eye exhibits no discharge. No scleral icterus.  Neck: Normal range of motion. Neck supple. No JVD present. Carotid bruit is not present.  Cardiovascular: An irregularly irregular rhythm present.  Pulmonary/Chest: Effort normal. She has  no decreased breath sounds. She has no wheezes. She has rhonchi in the right lower field and the left lower field. She has no rales.  Abdominal: Normal appearance. There is no splenomegaly or hepatomegaly.  Musculoskeletal: Normal range of motion.  Neurological: She is alert and oriented to person, place, and time.  Skin: Skin is warm, dry and intact. No rash noted. No pallor.  Psychiatric: She has a normal mood and affect. Her behavior is normal. Judgment and thought content normal.       Assessment & Plan:   Problem List Items Addressed This Visit      Unprioritized   Pneumonitis   Relevant Orders   DG Chest 2 View    Other Visit Diagnoses    Cough    -  Primary   suspect due to RAD.  Did well with Prednisone.  Start Breo.  Sample given.  Recheck chest x-ray   Relevant Orders   DG Chest 2 View      Inhaler use discussed and demonstrated  Follow up plan: Return in about 1 week (around 10/19/2016).

## 2016-10-13 ENCOUNTER — Ambulatory Visit
Admission: RE | Admit: 2016-10-13 | Discharge: 2016-10-13 | Disposition: A | Discharge status: Home / Self Care | Payer: Medicare Other | Source: Ambulatory Visit | Attending: Unknown Physician Specialty | Admitting: Unknown Physician Specialty

## 2016-10-13 DIAGNOSIS — R059 Cough, unspecified: Secondary | ICD-10-CM

## 2016-10-13 DIAGNOSIS — M8588 Other specified disorders of bone density and structure, other site: Secondary | ICD-10-CM | POA: Insufficient documentation

## 2016-10-13 DIAGNOSIS — I509 Heart failure, unspecified: Secondary | ICD-10-CM | POA: Diagnosis not present

## 2016-10-13 DIAGNOSIS — M4184 Other forms of scoliosis, thoracic region: Secondary | ICD-10-CM | POA: Diagnosis not present

## 2016-10-13 DIAGNOSIS — Z09 Encounter for follow-up examination after completed treatment for conditions other than malignant neoplasm: Secondary | ICD-10-CM | POA: Diagnosis not present

## 2016-10-13 DIAGNOSIS — R05 Cough: Secondary | ICD-10-CM | POA: Insufficient documentation

## 2016-10-13 DIAGNOSIS — M47814 Spondylosis without myelopathy or radiculopathy, thoracic region: Secondary | ICD-10-CM | POA: Insufficient documentation

## 2016-10-13 DIAGNOSIS — K449 Diaphragmatic hernia without obstruction or gangrene: Secondary | ICD-10-CM | POA: Diagnosis not present

## 2016-10-13 DIAGNOSIS — J811 Chronic pulmonary edema: Secondary | ICD-10-CM | POA: Insufficient documentation

## 2016-10-13 DIAGNOSIS — J189 Pneumonia, unspecified organism: Secondary | ICD-10-CM

## 2016-10-14 ENCOUNTER — Other Ambulatory Visit: Payer: Self-pay | Admitting: Unknown Physician Specialty

## 2016-10-25 ENCOUNTER — Encounter: Payer: Self-pay | Admitting: Unknown Physician Specialty

## 2016-10-25 ENCOUNTER — Ambulatory Visit (INDEPENDENT_AMBULATORY_CARE_PROVIDER_SITE_OTHER): Payer: Medicare Other | Admitting: Unknown Physician Specialty

## 2016-10-25 DIAGNOSIS — R059 Cough, unspecified: Secondary | ICD-10-CM | POA: Insufficient documentation

## 2016-10-25 DIAGNOSIS — R05 Cough: Secondary | ICD-10-CM | POA: Insufficient documentation

## 2016-10-25 DIAGNOSIS — I482 Chronic atrial fibrillation, unspecified: Secondary | ICD-10-CM

## 2016-10-25 DIAGNOSIS — I5022 Chronic systolic (congestive) heart failure: Secondary | ICD-10-CM | POA: Diagnosis not present

## 2016-10-25 DIAGNOSIS — I509 Heart failure, unspecified: Secondary | ICD-10-CM | POA: Insufficient documentation

## 2016-10-25 MED ORDER — BENZONATATE 100 MG PO CAPS: 100.0000 mg | ORAL_CAPSULE | Freq: Two times a day (BID) | ORAL | 0 refills | Status: DC | PRN

## 2016-10-25 NOTE — Assessment & Plan Note (Addendum)
Not sure she stopped the Lisinopril.  Take the Tessalon Perles at night.  Consider ENT referral as she is also clearing throat.  ? If she is still taking her Lisinopril

## 2016-10-25 NOTE — Patient Instructions (Addendum)
Call us and let us know what your are taking Restart the Diltiazem Take an extra lasix for the next 3 days.   Take the Tessalon Perles at night (ordered today)

## 2016-10-25 NOTE — Assessment & Plan Note (Signed)
Fast rate today.  Restart diltiazam

## 2016-10-25 NOTE — Progress Notes (Signed)
BP (!) 142/108 (BP Location: Left Arm, Patient Position: Sitting, Cuff Size: Large)   Pulse 91   Temp 97.9 F (36.6 C)   Wt 159 lb (72.1 kg)   LMP  (LMP Unknown)   SpO2 94%   BMI 30.34 kg/m    Subjective:    Patient ID: Melinda Preston, female    DOB: July 26, 1928, 82 y.o.   MRN: [id number]  HPI: Melinda OreBlanche Ector Hilyard is a 82 y.o. female  Chief Complaint  Patient presents with  . Cough    pt states she is still coughing   Pt is here for continued complaints about her cough.  Last chest x-ray shows she has no pneumonia or infection.  She does have some mild interstitial edema.  She states her cough is dry and more of a hacking cough and starts whenever "my head hits the pillow."  She is still SOB.  I did stop her Lisinopril and started her on Valsartan but is seems she stopped her Diltiazem and I'm not sure about her Lisinopril.  She did not bring a list or or all of her medications.  She does have some interstitial changes consistent with heart failure.    Relevant past medical, surgical, family and social history reviewed and updated as indicated. Interim medical history since our last visit reviewed. Allergies and medications reviewed and updated.  Review of Systems  Per HPI unless specifically indicated above     Objective:    BP (!) 142/108 (BP Location: Left Arm, Patient Position: Sitting, Cuff Size: Large)   Pulse 91   Temp 97.9 F (36.6 C)   Wt 159 lb (72.1 kg)   LMP  (LMP Unknown)   SpO2 94%   BMI 30.34 kg/m   Wt Readings from Last 3 Encounters:  10/25/16 159 lb (72.1 kg)  10/12/16 156 lb 3.2 oz (70.9 kg)  10/01/16 154 lb 3.2 oz (69.9 kg)    Physical Exam  Constitutional: She is oriented to person, place, and time. She appears well-developed and well-nourished. No distress.  HENT:  Head: Normocephalic and atraumatic.  Eyes: Conjunctivae and lids are normal. Right eye exhibits no discharge. Left eye exhibits no discharge. No scleral icterus.  Neck:  Normal range of motion. Neck supple. No JVD present. Carotid bruit is not present.  Cardiovascular: Normal heart sounds.  An irregularly irregular rhythm present.  Pulmonary/Chest: Effort normal. She has no decreased breath sounds. She has no wheezes. She has no rhonchi. She has rales in the left lower field.  Abdominal: Normal appearance. There is no splenomegaly or hepatomegaly.  Musculoskeletal: Normal range of motion.  Neurological: She is alert and oriented to person, place, and time.  Skin: Skin is warm, dry and intact. No rash noted. No pallor.  Psychiatric: She has a normal mood and affect. Her behavior is normal. Judgment and thought content normal.    Results for orders placed or performed in visit on 08/24/16  Comprehensive metabolic panel  Result Value Ref Range   Glucose 117 (H) 65 - 99 mg/dL   BUN 12 8 - 27 mg/dL   Creatinine, Ser 0.860.78 0.57 - 1.00 mg/dL   GFR calc non Af Amer 69 >59 mL/min/1.73   GFR calc Af Amer 80 >59 mL/min/1.73   BUN/Creatinine Ratio 15 12 - 28   Sodium 142 134 - 144 mmol/L   Potassium 4.0 3.5 - 5.2 mmol/L   Chloride 96 96 - 106 mmol/L   CO2 28 18 - 29 mmol/L  Calcium 9.5 8.7 - 10.3 mg/dL   Total Protein 6.6 6.0 - 8.5 g/dL   Albumin 4.3 3.5 - 4.7 g/dL   Globulin, Total 2.3 1.5 - 4.5 g/dL   Albumin/Globulin Ratio 1.9 1.2 - 2.2   Bilirubin Total 0.8 0.0 - 1.2 mg/dL   Alkaline Phosphatase 59 39 - 117 IU/L   AST 18 0 - 40 IU/L   ALT 25 0 - 32 IU/L      Assessment & Plan:   Problem List Items Addressed This Visit      Unprioritized   Chronic atrial fibrillation (HCC)    Fast rate today.  Restart diltiazam      Relevant Medications   diltiazem (CARDIZEM CD) 180 MG 24 hr capsule   Cough    Not sure she stopped the Lisinopril.  Take the Tessalon Perles at night.  Consider ENT referral as she is also clearing throat.  ? If she is still taking her Lisinopril      Heart failure (HCC)   Relevant Medications   diltiazem (CARDIZEM CD) 180 MG 24  hr capsule      The following written instructions were given:  Call us and let us know what your are taking Restart the Diltiazem (Cartia XT) Take an extra lasix for the next 3 days.   Tessalon Perles are for night-time use  Follow up plan: Return in about 1 week (around 11/01/2016).

## 2016-11-01 ENCOUNTER — Ambulatory Visit (INDEPENDENT_AMBULATORY_CARE_PROVIDER_SITE_OTHER): Payer: Medicare Other | Admitting: Unknown Physician Specialty

## 2016-11-01 ENCOUNTER — Encounter: Payer: Self-pay | Admitting: Unknown Physician Specialty

## 2016-11-01 VITALS — BP 116/77 | HR 93 | Temp 97.9°F | Wt 151.8 lb

## 2016-11-01 DIAGNOSIS — R05 Cough: Secondary | ICD-10-CM

## 2016-11-01 DIAGNOSIS — M25473 Effusion, unspecified ankle: Secondary | ICD-10-CM

## 2016-11-01 DIAGNOSIS — R059 Cough, unspecified: Secondary | ICD-10-CM

## 2016-11-01 NOTE — Patient Instructions (Signed)
Start wearing your support hose.

## 2016-11-01 NOTE — Assessment & Plan Note (Addendum)
This does not seem much difference to me than previous.  Use support hose she is now just using occasionally.  She is already taking Lasix.

## 2016-11-01 NOTE — Progress Notes (Signed)
BP 116/77 (BP Location: Left Arm, Patient Position: Sitting, Cuff Size: Large)   Pulse 93   Temp 97.9 F (36.6 C)   Wt 151 lb 12.8 oz (68.9 kg)   LMP  (LMP Unknown)   SpO2 94%   BMI 28.97 kg/m    Subjective:    Patient ID: Melinda Preston Selway, female    DOB: 1928/09/17, 82 y.o.   MRN: [id number]  HPI: Melinda Preston Weill is a 82 y.o. female  Chief Complaint  Patient presents with  . Follow-up    1 week f/up, states her cough is better  . Foot Swelling    pt states she had bilateral feet swelling for 2 to 3 weeks    Cough It seems she finally stopped the Lisinopril since she brought her medications in and we were able to update her medications.  Her cough is just about gone.    Foot swelling She is having bilateral foot swelling which seems to come and go and seems worse the last 2-3 days.   She has not been wearing her support hose.     Relevant past medical, surgical, family and social history reviewed and updated as indicated. Interim medical history since our last visit reviewed. Allergies and medications reviewed and updated.  Review of Systems  Per HPI unless specifically indicated above     Objective:    BP 116/77 (BP Location: Left Arm, Patient Position: Sitting, Cuff Size: Large)   Pulse 93   Temp 97.9 F (36.6 C)   Wt 151 lb 12.8 oz (68.9 kg)   LMP  (LMP Unknown)   SpO2 94%   BMI 28.97 kg/m   Wt Readings from Last 3 Encounters:  11/01/16 151 lb 12.8 oz (68.9 kg)  10/25/16 159 lb (72.1 kg)  10/12/16 156 lb 3.2 oz (70.9 kg)    Physical Exam  Constitutional: She is oriented to person, place, and time. She appears well-developed and well-nourished. No distress.  HENT:  Head: Normocephalic and atraumatic.  Eyes: Conjunctivae and lids are normal. Right eye exhibits no discharge. Left eye exhibits no discharge. No scleral icterus.  Neck: Normal range of motion. Neck supple. No JVD present. Carotid bruit is not present.  Cardiovascular: Normal  rate and normal heart sounds.  An irregularly irregular rhythm present.  Pulmonary/Chest: Effort normal and breath sounds normal.  Abdominal: Normal appearance. There is no splenomegaly or hepatomegaly.  Musculoskeletal: Normal range of motion.  Neurological: She is alert and oriented to person, place, and time.  Skin: Skin is warm, dry and intact. No rash noted. No pallor.  Psychiatric: She has a normal mood and affect. Her behavior is normal. Judgment and thought content normal.    Results for orders placed or performed in visit on 08/24/16  Comprehensive metabolic panel  Result Value Ref Range   Glucose 117 (H) 65 - 99 mg/dL   BUN 12 8 - 27 mg/dL   Creatinine, Ser 4.090.78 0.57 - 1.00 mg/dL   GFR calc non Af Amer 69 >59 mL/min/1.73   GFR calc Af Amer 80 >59 mL/min/1.73   BUN/Creatinine Ratio 15 12 - 28   Sodium 142 134 - 144 mmol/L   Potassium 4.0 3.5 - 5.2 mmol/L   Chloride 96 96 - 106 mmol/L   CO2 28 18 - 29 mmol/L   Calcium 9.5 8.7 - 10.3 mg/dL   Total Protein 6.6 6.0 - 8.5 g/dL   Albumin 4.3 3.5 - 4.7 g/dL   Globulin, Total 2.3 1.5 -  4.5 g/dL   Albumin/Globulin Ratio 1.9 1.2 - 2.2   Bilirubin Total 0.8 0.0 - 1.2 mg/dL   Alkaline Phosphatase 59 39 - 117 IU/L   AST 18 0 - 40 IU/L   ALT 25 0 - 32 IU/L      Assessment & Plan:   Problem List Items Addressed This Visit      Unprioritized   Ankle edema    This does not seem much difference to me than previous.  Use support hose she is now just using occasionally.  She is already taking Lasix.         Other Visit Diagnoses    Cough    -  Primary   Resolved       Follow up plan: Return in about 3 months (around 01/30/2017).

## 2016-11-02 ENCOUNTER — Other Ambulatory Visit: Payer: Self-pay | Admitting: Family Medicine

## 2016-12-01 ENCOUNTER — Other Ambulatory Visit: Payer: Self-pay | Admitting: Unknown Physician Specialty

## 2016-12-06 DIAGNOSIS — I10 Essential (primary) hypertension: Secondary | ICD-10-CM | POA: Diagnosis not present

## 2016-12-06 DIAGNOSIS — I481 Persistent atrial fibrillation: Secondary | ICD-10-CM | POA: Diagnosis not present

## 2016-12-06 DIAGNOSIS — I429 Cardiomyopathy, unspecified: Secondary | ICD-10-CM | POA: Diagnosis not present

## 2016-12-06 DIAGNOSIS — I350 Nonrheumatic aortic (valve) stenosis: Secondary | ICD-10-CM | POA: Diagnosis not present

## 2016-12-06 DIAGNOSIS — I5021 Acute systolic (congestive) heart failure: Secondary | ICD-10-CM | POA: Diagnosis not present

## 2016-12-20 DIAGNOSIS — I10 Essential (primary) hypertension: Secondary | ICD-10-CM | POA: Diagnosis not present

## 2016-12-20 DIAGNOSIS — I5021 Acute systolic (congestive) heart failure: Secondary | ICD-10-CM | POA: Diagnosis not present

## 2016-12-20 DIAGNOSIS — I350 Nonrheumatic aortic (valve) stenosis: Secondary | ICD-10-CM | POA: Diagnosis not present

## 2016-12-20 DIAGNOSIS — I481 Persistent atrial fibrillation: Secondary | ICD-10-CM | POA: Diagnosis not present

## 2016-12-20 DIAGNOSIS — I429 Cardiomyopathy, unspecified: Secondary | ICD-10-CM | POA: Diagnosis not present

## 2017-01-28 DIAGNOSIS — M170 Bilateral primary osteoarthritis of knee: Secondary | ICD-10-CM | POA: Diagnosis not present

## 2017-01-31 ENCOUNTER — Encounter: Payer: Self-pay | Admitting: Unknown Physician Specialty

## 2017-01-31 ENCOUNTER — Ambulatory Visit (INDEPENDENT_AMBULATORY_CARE_PROVIDER_SITE_OTHER): Payer: Medicare Other | Admitting: Unknown Physician Specialty

## 2017-01-31 VITALS — BP 134/92 | HR 67 | Temp 98.3°F | Ht 60.2 in | Wt 150.3 lb

## 2017-01-31 DIAGNOSIS — E119 Type 2 diabetes mellitus without complications: Secondary | ICD-10-CM | POA: Diagnosis not present

## 2017-01-31 DIAGNOSIS — R601 Generalized edema: Secondary | ICD-10-CM | POA: Diagnosis not present

## 2017-01-31 DIAGNOSIS — Z23 Encounter for immunization: Secondary | ICD-10-CM | POA: Diagnosis not present

## 2017-01-31 DIAGNOSIS — I5022 Chronic systolic (congestive) heart failure: Secondary | ICD-10-CM | POA: Diagnosis not present

## 2017-01-31 DIAGNOSIS — I10 Essential (primary) hypertension: Secondary | ICD-10-CM

## 2017-01-31 LAB — BAYER DCA HB A1C WAIVED: HB A1C (BAYER DCA - WAIVED): 6.7 % (ref <7.0)

## 2017-01-31 NOTE — Assessment & Plan Note (Signed)
Stable with Hgb A1C of 6.7% , continue present medications.

## 2017-01-31 NOTE — Assessment & Plan Note (Signed)
Doing well with Furosemide.  Also sees Dr. Cassie FreerParachos

## 2017-01-31 NOTE — Progress Notes (Signed)
BP (!) 134/92 (BP Location: Left Arm, Cuff Size: Normal)   Pulse 67   Temp 98.3 F (36.8 C)   Ht 5' 0.2" (1.529 m) Comment: pt had shoes on  Wt 150 lb 4.8 oz (68.2 kg) Comment: pt had shoes on  LMP  (LMP Unknown)   SpO2 94%   BMI 29.16 kg/m    Subjective:    Patient ID: Melinda Preston, female    DOB: 07-21-1928, 83 y.o.   MRN: [id number]  HPI: Melinda Preston is a 83 y.o. female  Chief Complaint  Patient presents with  . Hyperlipidemia  . Hypertension  . Diabetes    pt states she has eye exam scheduled next Monday, asked to have a copy sent to [place]    Diabetes: Diet controlled No hypoglycemic episodes No hyperglycemic episodes Feet problems:none Blood Sugars averaging: eye exam within last year Last Hgb A1C:6.5  Hypertension  Using medications without difficulty Average home BPs   Using medication without problems or lightheadedness No chest pain with exertion or shortness of breath Ankle edema improved with Lasix.    Elevated Cholesterol Using medications without problems No Muscle aches     Relevant past medical, surgical, family and social history reviewed and updated as indicated. Interim medical history since our last visit reviewed. Allergies and medications reviewed and updated.  Review of Systems  Per HPI unless specifically indicated above     Objective:    BP (!) 134/92 (BP Location: Left Arm, Cuff Size: Normal)   Pulse 67   Temp 98.3 F (36.8 C)   Ht 5' 0.2" (1.529 m) Comment: pt had shoes on  Wt 150 lb 4.8 oz (68.2 kg) Comment: pt had shoes on  LMP  (LMP Unknown)   SpO2 94%   BMI 29.16 kg/m   Wt Readings from Last 3 Encounters:  01/31/17 150 lb 4.8 oz (68.2 kg)  11/01/16 151 lb 12.8 oz (68.9 kg)  10/25/16 159 lb (72.1 kg)    Physical Exam  Constitutional: She is oriented to person, place, and time. She appears well-developed and well-nourished. No distress.  HENT:  Head: Normocephalic and atraumatic.  Eyes:  Conjunctivae and lids are normal. Right eye exhibits no discharge. Left eye exhibits no discharge. No scleral icterus.  Neck: Normal range of motion. Neck supple. No JVD present. Carotid bruit is not present.  Cardiovascular: An irregularly irregular rhythm present.  Mild ankle swelling  Pulmonary/Chest: Effort normal and breath sounds normal.  Abdominal: Normal appearance. There is no splenomegaly or hepatomegaly.  Musculoskeletal: Normal range of motion.  Neurological: She is alert and oriented to person, place, and time.  Skin: Skin is warm, dry and intact. No rash noted. No pallor.  Psychiatric: She has a normal mood and affect. Her behavior is normal. Judgment and thought content normal.    Results for orders placed or performed in visit on 08/24/16  Comprehensive metabolic panel  Result Value Ref Range   Glucose 117 (H) 65 - 99 mg/dL   BUN 12 8 - 27 mg/dL   Creatinine, Ser 4.09 0.57 - 1.00 mg/dL   GFR calc non Af Amer 69 >59 mL/min/1.73   GFR calc Af Amer 80 >59 mL/min/1.73   BUN/Creatinine Ratio 15 12 - 28   Sodium 142 134 - 144 mmol/L   Potassium 4.0 3.5 - 5.2 mmol/L   Chloride 96 96 - 106 mmol/L   CO2 28 18 - 29 mmol/L   Calcium 9.5 8.7 - 10.3 mg/dL  Total Protein 6.6 6.0 - 8.5 g/dL   Albumin 4.3 3.5 - 4.7 g/dL   Globulin, Total 2.3 1.5 - 4.5 g/dL   Albumin/Globulin Ratio 1.9 1.2 - 2.2   Bilirubin Total 0.8 0.0 - 1.2 mg/dL   Alkaline Phosphatase 59 39 - 117 IU/L   AST 18 0 - 40 IU/L   ALT 25 0 - 32 IU/L      Assessment & Plan:   Problem List Items Addressed This Visit      Unprioritized   Edema    Improved with Furosemide.        Essential hypertension    Controlled rate      Relevant Medications   furosemide (LASIX) 40 MG tablet   Other Relevant Orders   Bayer DCA Hb A1c Waived   Comprehensive metabolic panel   Heart failure (HCC)    Doing well with Furosemide.  Also sees Dr. Cassie FreerParachos      Relevant Medications   furosemide (LASIX) 40 MG tablet    Type 2 diabetes mellitus (HCC)   Relevant Orders   Bayer DCA Hb A1c Waived    Other Visit Diagnoses    Need for diphtheria-tetanus-pertussis (Tdap) vaccine, adult/adolescent    -  Primary   Relevant Orders   Tdap vaccine greater than or equal to 7yo IM (Completed)       Follow up plan: No Follow-up on file.

## 2017-01-31 NOTE — Assessment & Plan Note (Signed)
Controlled rate

## 2017-01-31 NOTE — Patient Instructions (Signed)
Tdap Vaccine (Tetanus, Diphtheria and Pertussis): What You Need to Know 1. Why get vaccinated? Tetanus, diphtheria and pertussis are very serious diseases. Tdap vaccine can protect us from these diseases. And, Tdap vaccine given to pregnant women can protect newborn babies against pertussis. TETANUS (Lockjaw) is rare in the [place] today. It causes painful muscle tightening and stiffness, usually all over the body.  It can lead to tightening of muscles in the head and neck so you can't open your mouth, swallow, or sometimes even breathe. Tetanus kills about 1 out of 10 people who are infected even after receiving the best medical care.  DIPHTHERIA is also rare in the [place] today. It can cause a thick coating to form in the back of the throat.  It can lead to breathing problems, heart failure, paralysis, and death.  PERTUSSIS (Whooping Cough) causes severe coughing spells, which can cause difficulty breathing, vomiting and disturbed sleep.  It can also lead to weight loss, incontinence, and rib fractures. Up to 2 in 100 adolescents and 5 in 100 adults with pertussis are hospitalized or have complications, which could include pneumonia or death.  These diseases are caused by bacteria. Diphtheria and pertussis are spread from person to person through secretions from coughing or sneezing. Tetanus enters the body through cuts, scratches, or wounds. Before vaccines, as many as 200,000 cases of diphtheria, 200,000 cases of pertussis, and hundreds of cases of tetanus, were reported in the [place] each year. Since vaccination began, reports of cases for tetanus and diphtheria have dropped by about 99% and for pertussis by about 80%. 2. Tdap vaccine Tdap vaccine can protect adolescents and adults from tetanus, diphtheria, and pertussis. One dose of Tdap is routinely given at age 11 or 12. People who did not get Tdap at that age should get it as soon as possible. Tdap is especially  important for healthcare professionals and anyone having close contact with a baby younger than 12 months. Pregnant women should get a dose of Tdap during every pregnancy, to protect the newborn from pertussis. Infants are most at risk for severe, life-threatening complications from pertussis. Another vaccine, called Td, protects against tetanus and diphtheria, but not pertussis. A Td booster should be given every 10 years. Tdap may be given as one of these boosters if you have never gotten Tdap before. Tdap may also be given after a severe cut or burn to prevent tetanus infection. Your doctor or the person giving you the vaccine can give you more information. Tdap may safely be given at the same time as other vaccines. 3. Some people should not get this vaccine  A person who has ever had a life-threatening allergic reaction after a previous dose of any diphtheria, tetanus or pertussis containing vaccine, OR has a severe allergy to any part of this vaccine, should not get Tdap vaccine. Tell the person giving the vaccine about any severe allergies.  Anyone who had coma or long repeated seizures within 7 days after a childhood dose of DTP or DTaP, or a previous dose of Tdap, should not get Tdap, unless a cause other than the vaccine was found. They can still get Td.  Talk to your doctor if you: ? have seizures or another nervous system problem, ? had severe pain or swelling after any vaccine containing diphtheria, tetanus or pertussis, ? ever had a condition called Guillain-Barr Syndrome (GBS), ? aren't feeling well on the day the shot is scheduled. 4. Risks With any medicine, including   vaccines, there is a chance of side effects. These are usually mild and go away on their own. Serious reactions are also possible but are rare. Most people who get Tdap vaccine do not have any problems with it. Mild problems following Tdap: (Did not interfere with activities)  Pain where the shot was given (about  3 in 4 adolescents or 2 in 3 adults)  Redness or swelling where the shot was given (about 1 person in 5)  Mild fever of at least 100.4F (up to about 1 in 25 adolescents or 1 in 100 adults)  Headache (about 3 or 4 people in 10)  Tiredness (about 1 person in 3 or 4)  Nausea, vomiting, diarrhea, stomach ache (up to 1 in 4 adolescents or 1 in 10 adults)  Chills, sore joints (about 1 person in 10)  Body aches (about 1 person in 3 or 4)  Rash, swollen glands (uncommon)  Moderate problems following Tdap: (Interfered with activities, but did not require medical attention)  Pain where the shot was given (up to 1 in 5 or 6)  Redness or swelling where the shot was given (up to about 1 in 16 adolescents or 1 in 12 adults)  Fever over 102F (about 1 in 100 adolescents or 1 in 250 adults)  Headache (about 1 in 7 adolescents or 1 in 10 adults)  Nausea, vomiting, diarrhea, stomach ache (up to 1 or 3 people in 100)  Swelling of the entire arm where the shot was given (up to about 1 in 500).  Severe problems following Tdap: (Unable to perform usual activities; required medical attention)  Swelling, severe pain, bleeding and redness in the arm where the shot was given (rare).  Problems that could happen after any vaccine:  People sometimes faint after a medical procedure, including vaccination. Sitting or lying down for about 15 minutes can help prevent fainting, and injuries caused by a fall. Tell your doctor if you feel dizzy, or have vision changes or ringing in the ears.  Some people get severe pain in the shoulder and have difficulty moving the arm where a shot was given. This happens very rarely.  Any medication can cause a severe allergic reaction. Such reactions from a vaccine are very rare, estimated at fewer than 1 in a million doses, and would happen within a few minutes to a few hours after the vaccination. As with any medicine, there is a very remote chance of a vaccine  causing a serious injury or death. The safety of vaccines is always being monitored. For more information, visit: www.cdc.gov/vaccinesafety/ 5. What if there is a serious problem? What should I look for? Look for anything that concerns you, such as signs of a severe allergic reaction, very high fever, or unusual behavior. Signs of a severe allergic reaction can include hives, swelling of the face and throat, difficulty breathing, a fast heartbeat, dizziness, and weakness. These would usually start a few minutes to a few hours after the vaccination. What should I do?  If you think it is a severe allergic reaction or other emergency that can't wait, call 9-1-1 or get the person to the nearest hospital. Otherwise, call your doctor.  Afterward, the reaction should be reported to the Vaccine Adverse Event Reporting System (VAERS). Your doctor might file this report, or you can do it yourself through the VAERS web site at www.vaers.hhs.gov, or by calling 1-800-822-7967. ? VAERS does not give medical advice. 6. The National Vaccine Injury Compensation Program The National   Vaccine Injury Compensation Program (VICP) is a federal program that was created to compensate people who may have been injured by certain vaccines. Persons who believe they may have been injured by a vaccine can learn about the program and about filing a claim by calling 1-800-338-2382 or visiting the VICP website at www.hrsa.gov/vaccinecompensation. There is a time limit to file a claim for compensation. 7. How can I learn more?  Ask your doctor. He or she can give you the vaccine package insert or suggest other sources of information.  Call your local or state health department.  Contact the Centers for Disease Control and Prevention (CDC): ? Call 1-800-232-4636 (1-800-CDC-INFO) or ? Visit CDC's website at www.cdc.gov/vaccines CDC Tdap Vaccine VIS (01/08/14) This information is not intended to replace advice given to you by your  health care provider. Make sure you discuss any questions you have with your health care provider. Document Released: 05/02/2012 Document Revised: 07/22/2016 Document Reviewed: 07/22/2016 Elsevier Interactive Patient Education  2017 Elsevier Inc.  

## 2017-01-31 NOTE — Assessment & Plan Note (Signed)
Improved with Furosemide.

## 2017-02-01 ENCOUNTER — Encounter: Payer: Self-pay | Admitting: Unknown Physician Specialty

## 2017-02-01 LAB — COMPREHENSIVE METABOLIC PANEL
ALBUMIN: 4.3 g/dL (ref 3.5–4.7)
ALT: 7 IU/L (ref 0–32)
AST: 18 IU/L (ref 0–40)
Albumin/Globulin Ratio: 2 (ref 1.2–2.2)
Alkaline Phosphatase: 71 IU/L (ref 39–117)
BILIRUBIN TOTAL: 0.7 mg/dL (ref 0.0–1.2)
BUN/Creatinine Ratio: 28 (ref 12–28)
BUN: 17 mg/dL (ref 8–27)
CHLORIDE: 98 mmol/L (ref 96–106)
CO2: 26 mmol/L (ref 18–29)
Calcium: 9.4 mg/dL (ref 8.7–10.3)
Creatinine, Ser: 0.6 mg/dL (ref 0.57–1.00)
GFR calc non Af Amer: 83 mL/min/{1.73_m2} (ref >59)
GFR, EST AFRICAN AMERICAN: 95 mL/min/{1.73_m2} (ref >59)
GLUCOSE: 110 mg/dL — AB (ref 65–99)
Globulin, Total: 2.2 g/dL (ref 1.5–4.5)
Potassium: 3.6 mmol/L (ref 3.5–5.2)
Sodium: 146 mmol/L — ABNORMAL HIGH (ref 134–144)
TOTAL PROTEIN: 6.5 g/dL (ref 6.0–8.5)

## 2017-02-04 DIAGNOSIS — M170 Bilateral primary osteoarthritis of knee: Secondary | ICD-10-CM | POA: Diagnosis not present

## 2017-02-07 DIAGNOSIS — E113293 Type 2 diabetes mellitus with mild nonproliferative diabetic retinopathy without macular edema, bilateral: Secondary | ICD-10-CM | POA: Diagnosis not present

## 2017-02-07 LAB — HM DIABETES EYE EXAM

## 2017-02-14 DIAGNOSIS — M25562 Pain in left knee: Secondary | ICD-10-CM | POA: Diagnosis not present

## 2017-02-14 DIAGNOSIS — G8929 Other chronic pain: Secondary | ICD-10-CM | POA: Diagnosis not present

## 2017-02-14 DIAGNOSIS — M170 Bilateral primary osteoarthritis of knee: Secondary | ICD-10-CM | POA: Diagnosis not present

## 2017-02-14 DIAGNOSIS — M25561 Pain in right knee: Secondary | ICD-10-CM | POA: Diagnosis not present

## 2017-02-28 ENCOUNTER — Other Ambulatory Visit: Payer: Self-pay | Admitting: Family Medicine

## 2017-02-28 ENCOUNTER — Other Ambulatory Visit: Payer: Self-pay | Admitting: Unknown Physician Specialty

## 2017-02-28 NOTE — Telephone Encounter (Signed)
rx

## 2017-03-23 DIAGNOSIS — I350 Nonrheumatic aortic (valve) stenosis: Secondary | ICD-10-CM | POA: Diagnosis not present

## 2017-03-23 DIAGNOSIS — I420 Dilated cardiomyopathy: Secondary | ICD-10-CM | POA: Diagnosis not present

## 2017-03-23 DIAGNOSIS — I10 Essential (primary) hypertension: Secondary | ICD-10-CM | POA: Diagnosis not present

## 2017-03-23 DIAGNOSIS — I340 Nonrheumatic mitral (valve) insufficiency: Secondary | ICD-10-CM | POA: Diagnosis not present

## 2017-03-23 DIAGNOSIS — I481 Persistent atrial fibrillation: Secondary | ICD-10-CM | POA: Diagnosis not present

## 2017-03-29 ENCOUNTER — Other Ambulatory Visit: Payer: Self-pay | Admitting: Unknown Physician Specialty

## 2017-04-27 ENCOUNTER — Other Ambulatory Visit: Payer: Self-pay | Admitting: Unknown Physician Specialty

## 2017-04-27 NOTE — Telephone Encounter (Signed)
LV: 01/31/2017. Appt: 08/09/2017

## 2017-04-29 ENCOUNTER — Telehealth: Payer: Self-pay | Admitting: Unknown Physician Specialty

## 2017-04-29 NOTE — Telephone Encounter (Signed)
Patient is having lot of nasal congestion and cough.  She is calling to see if something could be called in for this.    [phone]  Pharmacy is [facility] in [place]

## 2017-04-29 NOTE — Telephone Encounter (Signed)
Routing to provider  

## 2017-04-29 NOTE — Telephone Encounter (Signed)
Discussed with pt.  It would be best to stick with OTC Coricidin and Flonase

## 2017-05-02 ENCOUNTER — Encounter: Payer: Self-pay | Admitting: Unknown Physician Specialty

## 2017-05-02 ENCOUNTER — Ambulatory Visit (INDEPENDENT_AMBULATORY_CARE_PROVIDER_SITE_OTHER): Payer: Medicare Other | Admitting: Unknown Physician Specialty

## 2017-05-02 VITALS — BP 123/84 | HR 64 | Temp 98.4°F | Wt 149.2 lb

## 2017-05-02 DIAGNOSIS — J069 Acute upper respiratory infection, unspecified: Secondary | ICD-10-CM

## 2017-05-02 MED ORDER — BENZONATATE 100 MG PO CAPS: 100.0000 mg | ORAL_CAPSULE | Freq: Two times a day (BID) | ORAL | 1 refills | Status: DC | PRN

## 2017-05-02 MED ORDER — TRAZODONE HCL 50 MG PO TABS: 50.0000 mg | ORAL_TABLET | Freq: Every day | ORAL | 1 refills | Status: DC

## 2017-05-02 NOTE — Progress Notes (Signed)
   BP 123/84   Pulse 64   Temp 98.4 F (36.9 C)   Wt 149 lb 3.2 oz (67.7 kg)   LMP  (LMP Unknown)   SpO2 97%   BMI 28.95 kg/m    Subjective:    Patient ID: Melinda Preston, female    DOB: [birth year], 83 y.o.   MRN: [id number]  HPI: Melinda Preston is a 83 y.o. female  Chief Complaint  Patient presents with  . Allergies    pt states she has been coughing, sneezing, and blowing her nose since Wednesday of last week. States she has tried taking coricidin HBP   URI   This is a new problem. Episode onset: 4 days. The problem has been gradually improving. There has been no fever. Associated symptoms include congestion, coughing, rhinorrhea and sneezing. She has tried nothing for the symptoms.    Relevant past medical, surgical, family and social history reviewed and updated as indicated. Interim medical history since our last visit reviewed. Allergies and medications reviewed and updated.  Review of Systems  HENT: Positive for congestion, rhinorrhea and sneezing.   Respiratory: Positive for cough.     Per HPI unless specifically indicated above     Objective:    BP 123/84   Pulse 64   Temp 98.4 F (36.9 C)   Wt 149 lb 3.2 oz (67.7 kg)   LMP  (LMP Unknown)   SpO2 97%   BMI 28.95 kg/m   Wt Readings from Last 3 Encounters:  05/02/17 149 lb 3.2 oz (67.7 kg)  01/31/17 150 lb 4.8 oz (68.2 kg)  11/01/16 151 lb 12.8 oz (68.9 kg)    Physical Exam  Constitutional: She is oriented to person, place, and time. She appears well-developed and well-nourished. No distress.  HENT:  Head: Normocephalic and atraumatic.  Right Ear: Tympanic membrane and ear canal normal.  Left Ear: Tympanic membrane and ear canal normal.  Nose: Rhinorrhea present. Right sinus exhibits no maxillary sinus tenderness and no frontal sinus tenderness. Left sinus exhibits no maxillary sinus tenderness and no frontal sinus tenderness.  Mouth/Throat: Mucous membranes are normal. Posterior  oropharyngeal erythema present.  Eyes: Conjunctivae and lids are normal. Right eye exhibits no discharge. Left eye exhibits no discharge. No scleral icterus.  Cardiovascular: Normal rate and regular rhythm.   Pulmonary/Chest: Effort normal and breath sounds normal. No respiratory distress.  Abdominal: Normal appearance. There is no splenomegaly or hepatomegaly.  Musculoskeletal: Normal range of motion.  Neurological: She is alert and oriented to person, place, and time.  Skin: Skin is intact. No rash noted. No pallor.  Psychiatric: She has a normal mood and affect. Her behavior is normal. Judgment and thought content normal.    Results for orders placed or performed in visit on 02/16/17  HM DIABETES EYE EXAM  Result Value Ref Range   HM Diabetic Eye Exam Retinopathy (A) No Retinopathy      Assessment & Plan:   Problem List Items Addressed This Visit    None    Visit Diagnoses    Upper respiratory tract infection, unspecified type    -  Primary   Supportive care.  Rx for Tessalon perles to take prn cough       Follow up plan: Return if symptoms worsen or fail to improve.

## 2017-05-02 NOTE — Patient Instructions (Addendum)
Upper Respiratory Infection, Adult Most upper respiratory infections (URIs) are a viral infection of the air passages leading to the lungs. A URI affects the nose, throat, and upper air passages. The most common type of URI is nasopharyngitis and is typically referred to as "the common cold." URIs run their course and usually go away on their own. Most of the time, a URI does not require medical attention, but sometimes a bacterial infection in the upper airways can follow a viral infection. This is called a secondary infection. Sinus and middle ear infections are common types of secondary upper respiratory infections. Bacterial pneumonia can also complicate a URI. A URI can worsen asthma and chronic obstructive pulmonary disease (COPD). Sometimes, these complications can require emergency medical care and may be life threatening. What are the causes? Almost all URIs are caused by viruses. A virus is a type of germ and can spread from one person to another. What increases the risk? You may be at risk for a URI if:  You smoke.  You have chronic heart or lung disease.  You have a weakened defense (immune) system.  You are very young or very old.  You have nasal allergies or asthma.  You work in crowded or poorly ventilated areas.  You work in health care facilities or schools.  What are the signs or symptoms? Symptoms typically develop 2-3 days after you come in contact with a cold virus. Most viral URIs last 7-10 days. However, viral URIs from the influenza virus (flu virus) can last 14-18 days and are typically more severe. Symptoms may include:  Runny or stuffy (congested) nose.  Sneezing.  Cough.  Sore throat.  Headache.  Fatigue.  Fever.  Loss of appetite.  Pain in your forehead, behind your eyes, and over your cheekbones (sinus pain).  Muscle aches.  How is this diagnosed? Your health care provider may diagnose a URI by:  Physical exam.  Tests to check that your  symptoms are not due to another condition such as: ? Strep throat. ? Sinusitis. ? Pneumonia. ? Asthma.  How is this treated? A URI goes away on its own with time. It cannot be cured with medicines, but medicines may be prescribed or recommended to relieve symptoms. Medicines may help:  Reduce your fever.  Reduce your cough.  Relieve nasal congestion.  Follow these instructions at home:  Take medicines only as directed by your health care provider.  Gargle warm saltwater or take cough drops to comfort your throat as directed by your health care provider.  Use a warm mist humidifier or inhale steam from a shower to increase air moisture. This may make it easier to breathe.  Drink enough fluid to keep your urine clear or pale yellow.  Eat soups and other clear broths and maintain good nutrition.  Rest as needed.  Return to work when your temperature has returned to normal or as your health care provider advises. You may need to stay home longer to avoid infecting others. You can also use a face mask and careful hand washing to prevent spread of the virus.  Increase the usage of your inhaler if you have asthma.  Do not use any tobacco products, including cigarettes, chewing tobacco, or electronic cigarettes. If you need help quitting, ask your health care provider. How is this prevented? The best way to protect yourself from getting a cold is to practice good hygiene.  Avoid oral or hand contact with people with cold symptoms.  Wash your   hands often if contact occurs.  There is no clear evidence that vitamin C, vitamin E, echinacea, or exercise reduces the chance of developing a cold. However, it is always recommended to get plenty of rest, exercise, and practice good nutrition. Contact a health care provider if:  You are getting worse rather than better.  Your symptoms are not controlled by medicine.  You have chills.  You have worsening shortness of breath.  You have  brown or red mucus.  You have yellow or brown nasal discharge.  You have pain in your face, especially when you bend forward.  You have a fever.  You have swollen neck glands.  You have pain while swallowing.  You have white areas in the back of your throat. Get help right away if:  You have severe or persistent: ? Headache. ? Ear pain. ? Sinus pain. ? Chest pain.  You have chronic lung disease and any of the following: ? Wheezing. ? Prolonged cough. ? Coughing up blood. ? A change in your usual mucus.  You have a stiff neck.  You have changes in your: ? Vision. ? Hearing. ? Thinking. ? Mood. This information is not intended to replace advice given to you by your health care provider. Make sure you discuss any questions you have with your health care provider. Document Released: 04/27/2001 Document Revised: 07/04/2016 Document Reviewed: 02/06/2014 Elsevier Interactive Patient Education  2017 [employer]vier Inc.  

## 2017-05-17 ENCOUNTER — Other Ambulatory Visit: Payer: Self-pay | Admitting: Unknown Physician Specialty

## 2017-05-17 DIAGNOSIS — Z1231 Encounter for screening mammogram for malignant neoplasm of breast: Secondary | ICD-10-CM

## 2017-06-01 ENCOUNTER — Ambulatory Visit
Admission: RE | Admit: 2017-06-01 | Discharge: 2017-06-01 | Disposition: A | Discharge status: Home / Self Care | Payer: Medicare Other | Source: Ambulatory Visit | Attending: Unknown Physician Specialty | Admitting: Unknown Physician Specialty

## 2017-06-01 DIAGNOSIS — Z1231 Encounter for screening mammogram for malignant neoplasm of breast: Secondary | ICD-10-CM | POA: Insufficient documentation

## 2017-06-02 ENCOUNTER — Other Ambulatory Visit: Payer: Self-pay | Admitting: Unknown Physician Specialty

## 2017-06-05 ENCOUNTER — Other Ambulatory Visit: Payer: Self-pay | Admitting: Unknown Physician Specialty

## 2017-06-08 ENCOUNTER — Telehealth: Payer: Self-pay | Admitting: Unknown Physician Specialty

## 2017-06-08 NOTE — Telephone Encounter (Signed)
Called pt to schedule Annual Wellness Visit with [facility]  - knb  °

## 2017-06-12 ENCOUNTER — Other Ambulatory Visit: Payer: Self-pay | Admitting: Unknown Physician Specialty

## 2017-07-07 ENCOUNTER — Ambulatory Visit (INDEPENDENT_AMBULATORY_CARE_PROVIDER_SITE_OTHER): Payer: Medicare Other

## 2017-07-07 VITALS — BP 146/75 | HR 83 | Temp 97.6°F | Resp 16 | Ht 60.0 in | Wt 156.6 lb

## 2017-07-07 DIAGNOSIS — Z0000 Encounter for general adult medical examination without abnormal findings: Secondary | ICD-10-CM

## 2017-07-07 NOTE — Patient Instructions (Signed)
Melinda Preston , Thank you for taking time to come for your Medicare Wellness Visit. I appreciate your ongoing commitment to your health goals. Please review the following plan we discussed and let me know if I can assist you in the future.   Screening recommendations/referrals: Colonoscopy: completed 11/15/2002, no longer required Mammogram: completed 06/01/2017 Bone Density: completed 04/29/2016 Recommended yearly ophthalmology/optometry visit for glaucoma screening and checkup Recommended yearly dental visit for hygiene and checkup  Vaccinations: Influenza vaccine: up to date, due 07/2017 Pneumococcal vaccine: up to date Tdap vaccine: up to date Shingles vaccine: up to date  Advanced directives: Please bring a copy of your health care power of attorney and living will to the office at your convenience.  Conditions/risks identified: none   Next appointment: Follow up on 08/09/2017 at 2:00pm with Phineas Inches. Follow up in one year for your annual wellness exam.    Preventive Care 65 Years and Older, Female Preventive care refers to lifestyle choices and visits with your health care provider that can promote health and wellness. What does preventive care include?  A yearly physical exam. This is also called an annual well check.  Dental exams once or twice a year.  Routine eye exams. Ask your health care provider how often you should have your eyes checked.  Personal lifestyle choices, including:  Daily care of your teeth and gums.  Regular physical activity.  Eating a healthy diet.  Avoiding tobacco and drug use.  Limiting alcohol use.  Practicing safe sex.  Taking low-dose aspirin every day.  Taking vitamin and mineral supplements as recommended by your health care provider. What happens during an annual well check? The services and screenings done by your health care provider during your annual well check will depend on your age, overall health, lifestyle risk  factors, and family history of disease. Counseling  Your health care provider may ask you questions about your:  Alcohol use.  Tobacco use.  Drug use.  Emotional well-being.  Home and relationship well-being.  Sexual activity.  Eating habits.  History of falls.  Memory and ability to understand (cognition).  Work and work Astronomer.  Reproductive health. Screening  You may have the following tests or measurements:  Height, weight, and BMI.  Blood pressure.  Lipid and cholesterol levels. These may be checked every 5 years, or more frequently if you are over 61 years old.  Skin check.  Lung cancer screening. You may have this screening every year starting at age 48 if you have a 30-pack-year history of smoking and currently smoke or have quit within the past 15 years.  Fecal occult blood test (FOBT) of the stool. You may have this test every year starting at age 87.  Flexible sigmoidoscopy or colonoscopy. You may have a sigmoidoscopy every 5 years or a colonoscopy every 10 years starting at age 62.  Hepatitis C blood test.  Hepatitis B blood test.  Sexually transmitted disease (STD) testing.  Diabetes screening. This is done by checking your blood sugar (glucose) after you have not eaten for a while (fasting). You may have this done every 1-3 years.  Bone density scan. This is done to screen for osteoporosis. You may have this done starting at age 31.  Mammogram. This may be done every 1-2 years. Talk to your health care provider about how often you should have regular mammograms. Talk with your health care provider about your test results, treatment options, and if necessary, the need for more tests. Vaccines  Your health care provider may recommend certain vaccines, such as:  Influenza vaccine. This is recommended every year.  Tetanus, diphtheria, and acellular pertussis (Tdap, Td) vaccine. You may need a Td booster every 10 years.  Zoster vaccine. You  may need this after age 26.  Pneumococcal 13-valent conjugate (PCV13) vaccine. One dose is recommended after age 24.  Pneumococcal polysaccharide (PPSV23) vaccine. One dose is recommended after age 3. Talk to your health care provider about which screenings and vaccines you need and how often you need them. This information is not intended to replace advice given to you by your health care provider. Make sure you discuss any questions you have with your health care provider. Document Released: 11/28/2015 Document Revised: 07/21/2016 Document Reviewed: 09/02/2015 Elsevier Interactive Patient Education  2017 [place] Prevention in the Home Falls can cause injuries. They can happen to people of all ages. There are many things you can do to make your home safe and to help prevent falls. What can I do on the outside of my home?  Regularly fix the edges of walkways and driveways and fix any cracks.  Remove anything that might make you trip as you walk through a door, such as a raised step or threshold.  Trim any bushes or trees on the path to your home.  Use bright outdoor lighting.  Clear any walking paths of anything that might make someone trip, such as rocks or tools.  Regularly check to see if handrails are loose or broken. Make sure that both sides of any steps have handrails.  Any raised decks and porches should have guardrails on the edges.  Have any leaves, snow, or ice cleared regularly.  Use sand or salt on walking paths during winter.  Clean up any spills in your garage right away. This includes oil or grease spills. What can I do in the bathroom?  Use night lights.  Install grab bars by the toilet and in the tub and shower. Do not use towel bars as grab bars.  Use non-skid mats or decals in the tub or shower.  If you need to sit down in the shower, use a plastic, non-slip stool.  Keep the floor dry. Clean up any water that spills on the floor as soon as  it happens.  Remove soap buildup in the tub or shower regularly.  Attach bath mats securely with double-sided non-slip rug tape.  Do not have throw rugs and other things on the floor that can make you trip. What can I do in the bedroom?  Use night lights.  Make sure that you have a light by your bed that is easy to reach.  Do not use any sheets or blankets that are too big for your bed. They should not hang down onto the floor.  Have a firm chair that has side arms. You can use this for support while you get dressed.  Do not have throw rugs and other things on the floor that can make you trip. What can I do in the kitchen?  Clean up any spills right away.  Avoid walking on wet floors.  Keep items that you use a lot in easy-to-reach places.  If you need to reach something above you, use a strong step stool that has a grab bar.  Keep electrical cords out of the way.  Do not use floor polish or wax that makes floors slippery. If you must use wax, use non-skid floor wax.  Do  not have throw rugs and other things on the floor that can make you trip. What can I do with my stairs?  Do not leave any items on the stairs.  Make sure that there are handrails on both sides of the stairs and use them. Fix handrails that are broken or loose. Make sure that handrails are as long as the stairways.  Check any carpeting to make sure that it is firmly attached to the stairs. Fix any carpet that is loose or worn.  Avoid having throw rugs at the top or bottom of the stairs. If you do have throw rugs, attach them to the floor with carpet tape.  Make sure that you have a light switch at the top of the stairs and the bottom of the stairs. If you do not have them, ask someone to add them for you. What else can I do to help prevent falls?  Wear shoes that:  Do not have high heels.  Have rubber bottoms.  Are comfortable and fit you well.  Are closed at the toe. Do not wear sandals.  If  you use a stepladder:  Make sure that it is fully opened. Do not climb a closed stepladder.  Make sure that both sides of the stepladder are locked into place.  Ask someone to hold it for you, if possible.  Clearly mark and make sure that you can see:  Any grab bars or handrails.  First and last steps.  Where the edge of each step is.  Use tools that help you move around (mobility aids) if they are needed. These include:  Canes.  Walkers.  Scooters.  Crutches.  Turn on the lights when you go into a dark area. Replace any light bulbs as soon as they burn out.  Set up your furniture so you have a clear path. Avoid moving your furniture around.  If any of your floors are uneven, fix them.  If there are any pets around you, be aware of where they are.  Review your medicines with your doctor. Some medicines can make you feel dizzy. This can increase your chance of falling. Ask your doctor what other things that you can do to help prevent falls. This information is not intended to replace advice given to you by your health care provider. Make sure you discuss any questions you have with your health care provider. Document Released: 08/28/2009 Document Revised: 04/08/2016 Document Reviewed: 12/06/2014 Elsevier Interactive Patient Education  2017 [employer].

## 2017-07-07 NOTE — Progress Notes (Signed)
Subjective:   Melinda Preston is a 83 y.o. female who presents for Medicare Annual (Subsequent) preventive examination.  Review of Systems: Cardiac Risk Factors include: advanced age (>73men, >61 women);dyslipidemia;diabetes mellitus;hypertension     Objective:     Vitals: BP (!) 146/75 (BP Location: Right Arm, Patient Position: Sitting)   Pulse 83   Temp 97.6 F (36.4 C)   Resp 16   Ht 5' (1.524 m)   Wt 156 lb 9.6 oz (71 kg)   LMP  (LMP Unknown)   BMI 30.58 kg/m   Body mass index is 30.58 kg/m.   Tobacco History  Smoking Status  . Never Smoker  Smokeless Tobacco  . Never Used     Counseling given: Not Answered   Past Medical History:  Diagnosis Date  . Anxiety   . Aortic heart valve narrowing   . Atrial fibrillation (HCC)   . Diabetes mellitus without complication (HCC)   . Disorder of aorta (HCC)   . Hyperlipidemia   . Hypertension   . Insomnia   . Overactive bladder    Past Surgical History:  Procedure Laterality Date  . APPENDECTOMY    . rotator cuff surgery Left    Family History  Problem Relation Age of Onset  . Heart disease Father   . Heart disease Brother   . Diabetes Son   . Breast cancer Sister    History  Sexual Activity  . Sexual activity: Yes    Outpatient Encounter Prescriptions as of 07/07/2017  Medication Sig  . acetaminophen (TYLENOL) 650 MG CR tablet Take 650 mg by mouth every 8 (eight) hours as needed for pain.  Marland Kitchen aspirin 81 MG tablet Take 81 mg by mouth daily.  Marland Kitchen atorvastatin (LIPITOR) 20 MG tablet TAKE 1 TABLET BY MOUTH EVERY NIGHT AT BEDTIME  . Calcium 600-200 MG-UNIT tablet Take 1 tablet by mouth daily.  . carvedilol (COREG) 3.125 MG tablet Take 3.125 mg by mouth 2 (two) times daily with a meal.  . diltiazem (CARDIZEM CD) 180 MG 24 hr capsule Take 180 mg by mouth daily.  Marland Kitchen ELIQUIS 2.5 MG TABS tablet TAKE 1 TABLET BY MOUTH TWICE DAILY  . furosemide (LASIX) 40 MG tablet Take 40 mg by mouth 2 (two) times daily.  .  Omega-3 Fatty Acids (FISH OIL) 1000 MG CAPS Take 1,000 mg by mouth daily.  Marland Kitchen omeprazole (PRILOSEC) 20 MG capsule TAKE ONE CAPSULE BY MOUTH EVERY DAY  . potassium chloride SA (K-DUR,KLOR-CON) 20 MEQ tablet Take 1 tablet (20 mEq total) by mouth daily.  . sertraline (ZOLOFT) 25 MG tablet Take 1 tablet (25 mg total) by mouth daily.  . traZODone (DESYREL) 50 MG tablet Take 1 tablet (50 mg total) by mouth at bedtime.  . [DISCONTINUED] benzonatate (TESSALON) 100 MG capsule Take 1 capsule (100 mg total) by mouth 2 (two) times daily as needed for cough. (Patient not taking: Reported on 07/07/2017)  . [DISCONTINUED] valsartan (DIOVAN) 80 MG tablet Take 1 tablet (80 mg total) by mouth daily. (Patient not taking: Reported on 07/07/2017)   No facility-administered encounter medications on file as of 07/07/2017.     Activities of Daily Living In your present state of health, do you have any difficulty performing the following activities: 07/07/2017  Hearing? N  Vision? Y  Difficulty concentrating or making decisions? N  Walking or climbing stairs? N  Dressing or bathing? N  Doing errands, shopping? N  Preparing Food and eating ? N  Using the Toilet? N  In the past six months, have you accidently leaked urine? N  Comment wears pads   Do you have problems with loss of bowel control? N  Managing your Medications? N  Managing your Finances? N  Housekeeping or managing your Housekeeping? N  Some recent data might be hidden    Patient Care Team: Gabriel Cirri, NP as PCP - General (Nurse Practitioner) Marcina Millard, MD as Consulting Physician (Cardiology) Anson [place], PA-C as Physician Assistant (Orthopedic Surgery)    Assessment:     Exercise Activities and Dietary recommendations Current Exercise Habits: Home exercise routine, Type of exercise: treadmill;strength training/weights, Time (Minutes): 45, Frequency (Times/Week): 1, Weekly Exercise (Minutes/Week): 45, Intensity: Mild,  Exercise limited by: None identified  Goals    None     Fall Risk Fall Risk  07/07/2017 03/22/2016 12/22/2015  Falls in the past year? No No No   Depression Screen PHQ 2/9 Scores 07/07/2017 03/22/2016 12/22/2015  PHQ - 2 Score 0 0 2  PHQ- 9 Score - - 10     Cognitive Function     6CIT Screen 07/07/2017  What Year? 0 points  What month? 0 points  What time? 0 points  Count back from 20 0 points  Months in reverse 0 points  Repeat phrase 2 points  Total Score 2    Immunization History  Administered Date(s) Administered  . Influenza, High Dose Seasonal PF 09/17/2016  . Influenza, Seasonal, Injecte, Preservative Fre 10/29/2013  . Influenza,inj,Quad PF,6+ Mos 10/30/2014  . Influenza-Unspecified 08/29/2015  . Pneumococcal Conjugate-13 04/23/2014  . Pneumococcal Polysaccharide-23 03/12/2015  . Tdap 01/31/2017  . Zoster 07/15/2006   Screening Tests Health Maintenance  Topic Date Due  . INFLUENZA VACCINE  06/15/2017  . FOOT EXAM  08/08/2017 (Originally 03/22/2017)  . HEMOGLOBIN A1C  08/03/2017  . OPHTHALMOLOGY EXAM  02/07/2018  . TETANUS/TDAP  02/01/2027  . DEXA SCAN  Completed  . PNA vac Low Risk Adult  Completed      Plan:     I have personally reviewed and addressed the Medicare Annual Wellness questionnaire and have noted the following in the patient's chart:  A. Medical and social history B. Use of alcohol, tobacco or illicit drugs  C. Current medications and supplements D. Functional ability and status E.  Nutritional status F.  Physical activity G. Advance directives H. List of other physicians I.  Hospitalizations, surgeries, and ER visits in previous 12 months J.  Vitals K. Screenings such as hearing and vision if needed, cognitive and depression L. Referrals and appointments  In addition, I have reviewed and discussed with patient certain preventive protocols, quality metrics, and best practice recommendations. A written personalized care plan for preventive  services as well as general preventive health recommendations were provided to patient.   Signed,  Marin Roberts, LPN Nurse Health Advisor   MD Recommendations Due for diabetic foot exam.

## 2017-07-08 ENCOUNTER — Other Ambulatory Visit: Payer: Self-pay | Admitting: Unknown Physician Specialty

## 2017-07-14 DIAGNOSIS — I350 Nonrheumatic aortic (valve) stenosis: Secondary | ICD-10-CM | POA: Diagnosis not present

## 2017-07-14 DIAGNOSIS — I420 Dilated cardiomyopathy: Secondary | ICD-10-CM | POA: Diagnosis not present

## 2017-07-14 DIAGNOSIS — I10 Essential (primary) hypertension: Secondary | ICD-10-CM | POA: Diagnosis not present

## 2017-07-14 DIAGNOSIS — I481 Persistent atrial fibrillation: Secondary | ICD-10-CM | POA: Diagnosis not present

## 2017-07-21 DIAGNOSIS — I481 Persistent atrial fibrillation: Secondary | ICD-10-CM | POA: Diagnosis not present

## 2017-07-21 DIAGNOSIS — I340 Nonrheumatic mitral (valve) insufficiency: Secondary | ICD-10-CM | POA: Diagnosis not present

## 2017-07-21 DIAGNOSIS — I420 Dilated cardiomyopathy: Secondary | ICD-10-CM | POA: Diagnosis not present

## 2017-07-21 DIAGNOSIS — I10 Essential (primary) hypertension: Secondary | ICD-10-CM | POA: Diagnosis not present

## 2017-07-21 DIAGNOSIS — Z79899 Other long term (current) drug therapy: Secondary | ICD-10-CM | POA: Diagnosis not present

## 2017-08-09 ENCOUNTER — Encounter: Payer: Self-pay | Admitting: Unknown Physician Specialty

## 2017-08-09 ENCOUNTER — Ambulatory Visit (INDEPENDENT_AMBULATORY_CARE_PROVIDER_SITE_OTHER): Payer: Medicare Other | Admitting: Unknown Physician Specialty

## 2017-08-09 VITALS — BP 124/68 | HR 71 | Temp 98.2°F | Ht 60.0 in | Wt 157.8 lb

## 2017-08-09 DIAGNOSIS — Z7189 Other specified counseling: Secondary | ICD-10-CM | POA: Diagnosis not present

## 2017-08-09 DIAGNOSIS — E7800 Pure hypercholesterolemia, unspecified: Secondary | ICD-10-CM

## 2017-08-09 DIAGNOSIS — I10 Essential (primary) hypertension: Secondary | ICD-10-CM | POA: Diagnosis not present

## 2017-08-09 DIAGNOSIS — Z23 Encounter for immunization: Secondary | ICD-10-CM

## 2017-08-09 DIAGNOSIS — E119 Type 2 diabetes mellitus without complications: Secondary | ICD-10-CM

## 2017-08-09 DIAGNOSIS — N3281 Overactive bladder: Secondary | ICD-10-CM | POA: Diagnosis not present

## 2017-08-09 LAB — BAYER DCA HB A1C WAIVED: HB A1C: 6.2 % (ref <7.0)

## 2017-08-09 MED ORDER — OXYBUTYNIN CHLORIDE ER 10 MG PO TB24: 10.0000 mg | ORAL_TABLET | Freq: Every day | ORAL | 6 refills | Status: DC

## 2017-08-09 NOTE — Assessment & Plan Note (Addendum)
A voluntary discussion about advance care planning including the explanation and discussion of advance directives was extensively discussed  with the patient.  Explanation about the health care proxy and Living will was reviewed and packet with forms with explanation of how to fill them out was given.  During this discussion, the patient was able to identify a health care proxy as her sons and plans to fill out the paperwork required.  Patient was offered a separate Advance Care Planning visit for further assistance with forms.  Pt does not wish to have chest compressions in case of cardiac arrest

## 2017-08-09 NOTE — Progress Notes (Signed)
BP 124/68   Pulse 71   Temp 98.2 F (36.8 C)   Ht 5' (1.524 m)   Wt 157 lb 12.8 oz (71.6 kg)   LMP  (LMP Unknown)   SpO2 97%   BMI 30.82 kg/m    Subjective:    Patient ID: Melinda Preston, female    DOB: 07-03-1928, 83 y.o.   MRN: [id number]  HPI: Melinda Preston is a 83 y.o. female  Chief Complaint  Patient presents with  . Annual Exam    pt had Wellness with [facility] 07/07/17   Getting shots in knees.  One is better than the other.    Diabetes: Using medications without difficulties No hypoglycemic episodes No hyperglycemic episodes Feet problems:none Blood Sugars averaging: Not checking eye exam within last year Last Hgb A1C: 6.7%  Hypertension  Using medications without difficulty Average home BPs Not checking   Using medication without problems or lightheadedness No chest pain with exertion or shortness of breath No Edema  Elevated Cholesterol Using medications without problems No Muscle aches  Diet: Exercise:Not much exercise  Heart Failure Sees Dr Cassie Freer.  Recent CMP was normal.  Last echo reviewed and EF 25%   Stress incontinence Would like something for constant leakage, particularly at night.    Relevant past medical, surgical, family and social history reviewed and updated as indicated. Interim medical history since our last visit reviewed. Allergies and medications reviewed and updated.  Review of Systems  Constitutional: Negative.   HENT: Negative.   Eyes: Negative.   Respiratory: Negative.   Cardiovascular: Negative.   Gastrointestinal: Negative.   Endocrine: Negative.   Genitourinary: Negative.   Musculoskeletal: Negative.   Skin: Negative.   Allergic/Immunologic: Negative.   Neurological: Negative.   Hematological: Negative.   Psychiatric/Behavioral: Negative.     Per HPI unless specifically indicated above     Objective:    BP 124/68   Pulse 71   Temp 98.2 F (36.8 C)   Ht 5' (1.524 m)   Wt 157 lb 12.8 oz  (71.6 kg)   LMP  (LMP Unknown)   SpO2 97%   BMI 30.82 kg/m   Wt Readings from Last 3 Encounters:  08/09/17 157 lb 12.8 oz (71.6 kg)  07/07/17 156 lb 9.6 oz (71 kg)  05/02/17 149 lb 3.2 oz (67.7 kg)    Physical Exam  Constitutional: She is oriented to person, place, and time. She appears well-developed and well-nourished. No distress.  HENT:  Head: Normocephalic and atraumatic.  Eyes: Conjunctivae and lids are normal. Right eye exhibits no discharge. Left eye exhibits no discharge. No scleral icterus.  Neck: Normal range of motion. Neck supple. No JVD present. Carotid bruit is not present.  Cardiovascular: Normal rate, regular rhythm and normal heart sounds.   Pulmonary/Chest: Effort normal and breath sounds normal.  Abdominal: Normal appearance. There is no splenomegaly or hepatomegaly.  Musculoskeletal: Normal range of motion.  Neurological: She is alert and oriented to person, place, and time.  Skin: Skin is warm, dry and intact. No rash noted. No pallor.  Psychiatric: She has a normal mood and affect. Her behavior is normal. Judgment and thought content normal.    Results for orders placed or performed in visit on 02/16/17  HM DIABETES EYE EXAM  Result Value Ref Range   HM Diabetic Eye Exam Retinopathy (A) No Retinopathy      Assessment & Plan:   Problem List Items Addressed This Visit      Unprioritized  Advanced care planning/counseling discussion    A voluntary discussion about advance care planning including the explanation and discussion of advance directives was extensively discussed  with the patient.  Explanation about the health care proxy and Living will was reviewed and packet with forms with explanation of how to fill them out was given.  During this discussion, the patient was able to identify a health care proxy as her sons and plans to fill out the paperwork required.  Patient was offered a separate Advance Care Planning visit for further assistance with forms.   Pt does not wish to have chest compressions in case of cardiac arrest       Essential hypertension   Relevant Medications   losartan (COZAAR) 25 MG tablet   Hypercholesterolemia   Relevant Medications   losartan (COZAAR) 25 MG tablet   Other Relevant Orders   Lipid Panel w/o Chol/HDL Ratio   Overactive bladder    Start Ditropan      Type 2 diabetes mellitus (HCC)    Hgb A1C is 6.2.  Diet controlled      Relevant Medications   losartan (COZAAR) 25 MG tablet   Other Relevant Orders   Bayer DCA Hb A1c Waived    Other Visit Diagnoses    Need for influenza vaccination    -  Primary   Relevant Orders   Flu vaccine HIGH DOSE PF (Completed)       Follow up plan: Return in about 6 months (around 02/06/2018).

## 2017-08-09 NOTE — Assessment & Plan Note (Signed)
Start Ditropan.

## 2017-08-09 NOTE — Patient Instructions (Signed)

## 2017-08-09 NOTE — Assessment & Plan Note (Addendum)
Hgb A1C is 6.2%.  Diet controlled 

## 2017-08-10 LAB — LIPID PANEL W/O CHOL/HDL RATIO
CHOLESTEROL TOTAL: 158 mg/dL (ref 100–199)
HDL: 55 mg/dL (ref >39)
LDL CALC: 75 mg/dL (ref 0–99)
Triglycerides: 138 mg/dL (ref 0–149)
VLDL Cholesterol Cal: 28 mg/dL (ref 5–40)

## 2017-08-13 ENCOUNTER — Other Ambulatory Visit: Payer: Self-pay | Admitting: Unknown Physician Specialty

## 2017-08-13 DIAGNOSIS — M25473 Effusion, unspecified ankle: Secondary | ICD-10-CM

## 2017-08-28 ENCOUNTER — Other Ambulatory Visit: Payer: Self-pay | Admitting: Unknown Physician Specialty

## 2017-09-02 ENCOUNTER — Other Ambulatory Visit: Payer: Self-pay | Admitting: Unknown Physician Specialty

## 2017-09-10 ENCOUNTER — Other Ambulatory Visit: Payer: Self-pay | Admitting: Unknown Physician Specialty

## 2017-09-16 DIAGNOSIS — M1712 Unilateral primary osteoarthritis, left knee: Secondary | ICD-10-CM | POA: Diagnosis not present

## 2017-09-23 DIAGNOSIS — M1712 Unilateral primary osteoarthritis, left knee: Secondary | ICD-10-CM | POA: Diagnosis not present

## 2017-09-30 DIAGNOSIS — M1712 Unilateral primary osteoarthritis, left knee: Secondary | ICD-10-CM | POA: Diagnosis not present

## 2017-10-04 ENCOUNTER — Other Ambulatory Visit: Payer: Self-pay | Admitting: Unknown Physician Specialty

## 2017-10-16 ENCOUNTER — Other Ambulatory Visit: Payer: Self-pay | Admitting: Unknown Physician Specialty

## 2017-10-20 DIAGNOSIS — I481 Persistent atrial fibrillation: Secondary | ICD-10-CM | POA: Diagnosis not present

## 2017-10-20 DIAGNOSIS — I340 Nonrheumatic mitral (valve) insufficiency: Secondary | ICD-10-CM | POA: Diagnosis not present

## 2017-10-20 DIAGNOSIS — E1121 Type 2 diabetes mellitus with diabetic nephropathy: Secondary | ICD-10-CM | POA: Diagnosis not present

## 2017-10-20 DIAGNOSIS — I10 Essential (primary) hypertension: Secondary | ICD-10-CM | POA: Diagnosis not present

## 2017-10-20 DIAGNOSIS — I420 Dilated cardiomyopathy: Secondary | ICD-10-CM | POA: Diagnosis not present

## 2017-11-25 ENCOUNTER — Other Ambulatory Visit: Payer: Self-pay | Admitting: Unknown Physician Specialty

## 2017-12-05 ENCOUNTER — Other Ambulatory Visit: Payer: Self-pay | Admitting: Family Medicine

## 2017-12-11 ENCOUNTER — Other Ambulatory Visit: Payer: Self-pay | Admitting: Unknown Physician Specialty

## 2017-12-21 ENCOUNTER — Encounter: Payer: Self-pay | Admitting: Unknown Physician Specialty

## 2017-12-27 ENCOUNTER — Other Ambulatory Visit: Payer: Self-pay | Admitting: Unknown Physician Specialty

## 2017-12-27 DIAGNOSIS — M25473 Effusion, unspecified ankle: Secondary | ICD-10-CM

## 2018-01-01 ENCOUNTER — Other Ambulatory Visit: Payer: Self-pay | Admitting: Unknown Physician Specialty

## 2018-01-23 DIAGNOSIS — E133293 Other specified diabetes mellitus with mild nonproliferative diabetic retinopathy without macular edema, bilateral: Secondary | ICD-10-CM | POA: Diagnosis not present

## 2018-01-23 LAB — HM DIABETES EYE EXAM

## 2018-02-06 ENCOUNTER — Ambulatory Visit: Payer: Medicare Other | Admitting: Unknown Physician Specialty

## 2018-02-16 DIAGNOSIS — E7800 Pure hypercholesterolemia, unspecified: Secondary | ICD-10-CM | POA: Diagnosis not present

## 2018-02-16 DIAGNOSIS — E1121 Type 2 diabetes mellitus with diabetic nephropathy: Secondary | ICD-10-CM | POA: Diagnosis not present

## 2018-02-16 DIAGNOSIS — I420 Dilated cardiomyopathy: Secondary | ICD-10-CM | POA: Diagnosis not present

## 2018-02-16 DIAGNOSIS — I481 Persistent atrial fibrillation: Secondary | ICD-10-CM | POA: Diagnosis not present

## 2018-02-16 DIAGNOSIS — I10 Essential (primary) hypertension: Secondary | ICD-10-CM | POA: Diagnosis not present

## 2018-02-20 ENCOUNTER — Ambulatory Visit: Payer: Medicare Other | Admitting: Unknown Physician Specialty

## 2018-02-21 ENCOUNTER — Ambulatory Visit (INDEPENDENT_AMBULATORY_CARE_PROVIDER_SITE_OTHER): Payer: Medicare Other | Admitting: Unknown Physician Specialty

## 2018-02-21 ENCOUNTER — Encounter: Payer: Self-pay | Admitting: Unknown Physician Specialty

## 2018-02-21 VITALS — BP 133/82 | HR 94 | Temp 98.6°F | Ht 60.0 in | Wt 166.2 lb

## 2018-02-21 DIAGNOSIS — R059 Cough, unspecified: Secondary | ICD-10-CM

## 2018-02-21 DIAGNOSIS — R05 Cough: Secondary | ICD-10-CM | POA: Diagnosis not present

## 2018-02-21 DIAGNOSIS — I10 Essential (primary) hypertension: Secondary | ICD-10-CM

## 2018-02-21 DIAGNOSIS — K219 Gastro-esophageal reflux disease without esophagitis: Secondary | ICD-10-CM | POA: Diagnosis not present

## 2018-02-21 DIAGNOSIS — R7301 Impaired fasting glucose: Secondary | ICD-10-CM | POA: Diagnosis not present

## 2018-02-21 DIAGNOSIS — E7800 Pure hypercholesterolemia, unspecified: Secondary | ICD-10-CM | POA: Diagnosis not present

## 2018-02-21 DIAGNOSIS — I482 Chronic atrial fibrillation, unspecified: Secondary | ICD-10-CM

## 2018-02-21 DIAGNOSIS — F5101 Primary insomnia: Secondary | ICD-10-CM

## 2018-02-21 DIAGNOSIS — R5383 Other fatigue: Secondary | ICD-10-CM | POA: Insufficient documentation

## 2018-02-21 DIAGNOSIS — N3281 Overactive bladder: Secondary | ICD-10-CM | POA: Diagnosis not present

## 2018-02-21 LAB — BAYER DCA HB A1C WAIVED: HB A1C: 6.2 % (ref <7.0)

## 2018-02-21 MED ORDER — TRAZODONE HCL 50 MG PO TABS: 50.0000 mg | ORAL_TABLET | Freq: Every day | ORAL | 1 refills | Status: DC

## 2018-02-21 MED ORDER — DILTIAZEM HCL ER COATED BEADS 180 MG PO CP24: 180.0000 mg | ORAL_CAPSULE | Freq: Every day | ORAL | 1 refills | Status: DC

## 2018-02-21 MED ORDER — LOSARTAN POTASSIUM 25 MG PO TABS: 25.0000 mg | ORAL_TABLET | Freq: Every day | ORAL | 11 refills | Status: DC

## 2018-02-21 MED ORDER — OMEPRAZOLE 20 MG PO CPDR: 20.0000 mg | DELAYED_RELEASE_CAPSULE | Freq: Every day | ORAL | 1 refills | Status: DC

## 2018-02-21 MED ORDER — BENZONATATE 200 MG PO CAPS: 200.0000 mg | ORAL_CAPSULE | Freq: Two times a day (BID) | ORAL | 0 refills | Status: DC | PRN

## 2018-02-21 MED ORDER — SERTRALINE HCL 25 MG PO TABS: 25.0000 mg | ORAL_TABLET | Freq: Every day | ORAL | 1 refills | Status: DC

## 2018-02-21 MED ORDER — CARVEDILOL 3.125 MG PO TABS: 3.1250 mg | ORAL_TABLET | Freq: Two times a day (BID) | ORAL | 3 refills | Status: DC

## 2018-02-21 NOTE — Assessment & Plan Note (Signed)
Hgb A1C is 6.2%

## 2018-02-21 NOTE — Assessment & Plan Note (Signed)
Fatigue for a period of time.  Check CBC and TSH

## 2018-02-21 NOTE — Assessment & Plan Note (Signed)
Takes Ditropan.  She uses it nightly but concerned about dementia.  She doesn't think it is helping.  I recommend stopping Ditropan and see if it makes a difference.

## 2018-02-21 NOTE — Assessment & Plan Note (Signed)
Stable, continue present medications.   

## 2018-02-21 NOTE — Assessment & Plan Note (Signed)
Check LDL today with goal of less than 70

## 2018-02-21 NOTE — Progress Notes (Signed)
BP 133/82 (BP Location: Left Arm, Cuff Size: Large)   Pulse 94   Temp 98.6 F (37 C) (Oral)   Ht 5' (1.524 m)   Wt 166 lb 3.2 oz (75.4 kg)   LMP  (LMP Unknown)   SpO2 96%   BMI 32.46 kg/m    Subjective:    Patient ID: Melinda Preston, female    DOB: 17-Jan-1928, 84 y.o.   MRN: [id number]  HPI: Melinda Preston is a 84 y.o. female  Chief Complaint  Patient presents with  . Depression  . Hyperlipidemia  . Hypertension  . URI    pt states she has had a cough and congestion since the weekend. Would like a refill on tessalon pearles   IFG: No hypoglycemic episodes No hyperglycemic episodes Feet problems:none Blood Sugars averaging: Not checking eye exam within last year Last Hgb A1C: 6.3  Hypertension  Using medications without difficulty Average home BPs Not checking  Using medication without problems or lightheadedness No chest pain with exertion or shortness of breath No Edema  Elevated Cholesterol Using medications without problems No Muscle aches  Diet: Exercise: Eating a lot of ice cream  Overactive bladder She uses it nightly but concerned about dementia.  She doesn't think it is helping anyway  Cough  This is a new problem. Episode onset: 3 days. The problem has been gradually improving. The problem occurs constantly. The cough is productive of sputum. Associated symptoms include headaches and shortness of breath. Pertinent negatives include no chest pain, chills, ear congestion, ear pain, fever, heartburn, hemoptysis, myalgias, nasal congestion, postnasal drip, rash, rhinorrhea, sore throat, sweats, weight loss or wheezing. Nothing aggravates the symptoms. Treatments tried: tessalon. The treatment provided no relief.   Fatigue Good mood but wishes she had something for energy  GERD Stable on present medications   Relevant past medical, surgical, family and social history reviewed and updated as indicated. Interim medical history since our  last visit reviewed. Allergies and medications reviewed and updated.  Review of Systems  Constitutional: Negative for chills, fever and weight loss.  HENT: Negative for ear pain, postnasal drip, rhinorrhea and sore throat.   Respiratory: Positive for cough and shortness of breath. Negative for hemoptysis and wheezing.   Cardiovascular: Negative for chest pain.  Gastrointestinal: Negative for heartburn.  Musculoskeletal: Negative for myalgias.  Skin: Negative for rash.  Neurological: Positive for headaches.    Per HPI unless specifically indicated above     Objective:    BP 133/82 (BP Location: Left Arm, Cuff Size: Large)   Pulse 94   Temp 98.6 F (37 C) (Oral)   Ht 5' (1.524 m)   Wt 166 lb 3.2 oz (75.4 kg)   LMP  (LMP Unknown)   SpO2 96%   BMI 32.46 kg/m   Wt Readings from Last 3 Encounters:  02/21/18 166 lb 3.2 oz (75.4 kg)  08/09/17 157 lb 12.8 oz (71.6 kg)  07/07/17 156 lb 9.6 oz (71 kg)    Physical Exam  Constitutional: She is oriented to person, place, and time. She appears well-developed and well-nourished. No distress.  HENT:  Head: Normocephalic and atraumatic.  Eyes: Conjunctivae and lids are normal. Right eye exhibits no discharge. Left eye exhibits no discharge. No scleral icterus.  Cardiovascular: Intact distal pulses. An irregularly irregular rhythm present.  Pulmonary/Chest: Effort normal.  Abdominal: Normal appearance. There is no splenomegaly or hepatomegaly.  Musculoskeletal: Normal range of motion.  Neurological: She is alert and oriented to person, place,  and time.  Skin: Skin is intact. No rash noted. No pallor.  Psychiatric: She has a normal mood and affect. Her behavior is normal. Judgment and thought content normal.  Nursing note and vitals reviewed.   Results for orders placed or performed in visit on 08/09/17  Bayer DCA Hb A1c Waived  Result Value Ref Range   Bayer DCA Hb A1c Waived 6.2 <7.0 %  Lipid Panel w/o Chol/HDL Ratio  Result Value  Ref Range   Cholesterol, Total 158 100 - 199 mg/dL   Triglycerides 161 0 - 149 mg/dL   HDL 55 >09 mg/dL   VLDL Cholesterol Cal 28 5 - 40 mg/dL   LDL Calculated 75 0 - 99 mg/dL      Assessment & Plan:   Problem List Items Addressed This Visit      Unprioritized   Chronic atrial fibrillation (HCC)    Takes Eliquis for stroke prevention      Essential hypertension - Primary    Stable, continue present medications.        Relevant Orders   Comprehensive metabolic panel   Fatigue    Fatigue for a period of time.  Check CBC and TSH      Relevant Orders   CBC with Differential/Platelet   TSH   Hypercholesterolemia    Check LDL today with goal of less than 70      Relevant Orders   Lipid Panel w/o Chol/HDL Ratio   IFG (impaired fasting glucose)    Hgb A1C is 6.2%      Relevant Orders   Bayer DCA Hb A1c Waived   Insomnia   Overactive bladder    Takes Ditropan.  She uses it nightly but concerned about dementia.  She doesn't think it is helping.  I recommend stopping Ditropan and see if it makes a difference.         Other Visit Diagnoses    Cough       Due to URI.  Refill Tessalon Perles.  Pt states they help       Follow up plan: Return in about 4 months (around 06/23/2018) for physical.

## 2018-02-21 NOTE — Assessment & Plan Note (Signed)
Takes Eliquis for stroke prevention

## 2018-02-22 LAB — CBC WITH DIFFERENTIAL/PLATELET
BASOS: 0 %
Basophils Absolute: 0 10*3/uL (ref 0.0–0.2)
EOS (ABSOLUTE): 0.1 10*3/uL (ref 0.0–0.4)
EOS: 1 %
Hematocrit: 43 % (ref 34.0–46.6)
Hemoglobin: 14 g/dL (ref 11.1–15.9)
IMMATURE GRANS (ABS): 0 10*3/uL (ref 0.0–0.1)
IMMATURE GRANULOCYTES: 0 %
LYMPHS: 19 %
Lymphocytes Absolute: 1 10*3/uL (ref 0.7–3.1)
MCH: 26.3 pg — ABNORMAL LOW (ref 26.6–33.0)
MCHC: 32.6 g/dL (ref 31.5–35.7)
MCV: 81 fL (ref 79–97)
Monocytes Absolute: 0.7 10*3/uL (ref 0.1–0.9)
Monocytes: 14 %
NEUTROS PCT: 66 %
Neutrophils Absolute: 3.4 10*3/uL (ref 1.4–7.0)
PLATELETS: 159 10*3/uL (ref 150–379)
RBC: 5.33 x10E6/uL — AB (ref 3.77–5.28)
RDW: 15.4 % (ref 12.3–15.4)
WBC: 5.1 10*3/uL (ref 3.4–10.8)

## 2018-02-22 LAB — COMPREHENSIVE METABOLIC PANEL
A/G RATIO: 2 (ref 1.2–2.2)
ALT: 10 IU/L (ref 0–32)
AST: 18 IU/L (ref 0–40)
Albumin: 4.5 g/dL (ref 3.5–4.7)
Alkaline Phosphatase: 75 IU/L (ref 39–117)
BUN/Creatinine Ratio: 13 (ref 12–28)
BUN: 11 mg/dL (ref 8–27)
Bilirubin Total: 0.6 mg/dL (ref 0.0–1.2)
CALCIUM: 9.4 mg/dL (ref 8.7–10.3)
CO2: 27 mmol/L (ref 20–29)
CREATININE: 0.83 mg/dL (ref 0.57–1.00)
Chloride: 100 mmol/L (ref 96–106)
GFR, EST AFRICAN AMERICAN: 73 mL/min/{1.73_m2} (ref >59)
GFR, EST NON AFRICAN AMERICAN: 64 mL/min/{1.73_m2} (ref >59)
GLUCOSE: 95 mg/dL (ref 65–99)
Globulin, Total: 2.2 g/dL (ref 1.5–4.5)
Potassium: 3.7 mmol/L (ref 3.5–5.2)
Sodium: 144 mmol/L (ref 134–144)
TOTAL PROTEIN: 6.7 g/dL (ref 6.0–8.5)

## 2018-02-22 LAB — LIPID PANEL W/O CHOL/HDL RATIO
Cholesterol, Total: 146 mg/dL (ref 100–199)
HDL: 47 mg/dL (ref >39)
LDL CALC: 71 mg/dL (ref 0–99)
TRIGLYCERIDES: 142 mg/dL (ref 0–149)
VLDL CHOLESTEROL CAL: 28 mg/dL (ref 5–40)

## 2018-02-22 LAB — TSH: TSH: 2.03 u[IU]/mL (ref 0.450–4.500)

## 2018-02-24 ENCOUNTER — Encounter: Payer: Self-pay | Admitting: Unknown Physician Specialty

## 2018-03-13 ENCOUNTER — Other Ambulatory Visit: Payer: Self-pay | Admitting: Unknown Physician Specialty

## 2018-03-22 ENCOUNTER — Other Ambulatory Visit: Payer: Self-pay | Admitting: Family Medicine

## 2018-03-22 NOTE — Telephone Encounter (Signed)
Your patient 

## 2018-03-24 ENCOUNTER — Other Ambulatory Visit: Payer: Self-pay | Admitting: Unknown Physician Specialty

## 2018-03-24 DIAGNOSIS — M25473 Effusion, unspecified ankle: Secondary | ICD-10-CM

## 2018-06-10 ENCOUNTER — Other Ambulatory Visit: Payer: Self-pay | Admitting: Unknown Physician Specialty

## 2018-06-21 ENCOUNTER — Ambulatory Visit (INDEPENDENT_AMBULATORY_CARE_PROVIDER_SITE_OTHER): Payer: Medicare Other | Admitting: Physician Assistant

## 2018-06-21 ENCOUNTER — Encounter: Payer: Self-pay | Admitting: Physician Assistant

## 2018-06-21 ENCOUNTER — Other Ambulatory Visit: Payer: Self-pay

## 2018-06-21 VITALS — BP 150/90 | HR 80 | Temp 98.3°F | Ht 61.5 in | Wt 169.0 lb

## 2018-06-21 DIAGNOSIS — I482 Chronic atrial fibrillation, unspecified: Secondary | ICD-10-CM

## 2018-06-21 DIAGNOSIS — T148XXA Other injury of unspecified body region, initial encounter: Secondary | ICD-10-CM

## 2018-06-21 NOTE — Progress Notes (Signed)
Subjective:    Patient ID: Melinda Preston, female    DOB: 1928-04-04, 84 y.o.   MRN: [id number]  Melinda Preston is a 84 y.o. female presenting on 06/21/2018 for Leg Pain (right leg sweeling, bruised since last wednessday morning per pt.)   HPI   Patient has a history of atrial fibrillation and is anticoagulated with Eliquis from Dr. Darrold Junker. She reports she has SOB since 2015. On Saturday, she reports she was climbing into an elevated truck and had her right shin run against the footboard. Afterwards she noticed significant bruising on the anterior of her right shin. Her calf is painful to touch where she hit it, but not in the back of her calf. She does not have any issues walking. She reports the bruising is better today and has improved over the interim. The bruising seems to move downward.   Social History   Tobacco Use  . Smoking status: Never Smoker  . Smokeless tobacco: Never Used  Substance Use Topics  . Alcohol use: No  . Drug use: No    Review of Systems Per HPI unless specifically indicated above     Objective:    BP (!) 150/90   Pulse 80   Temp 98.3 F (36.8 C) (Oral)   Ht 5' 1.5" (1.562 m)   Wt 169 lb (76.7 kg)   LMP  (LMP Unknown)   SpO2 95%   BMI 31.42 kg/m   Wt Readings from Last 3 Encounters:  06/21/18 169 lb (76.7 kg)  02/21/18 166 lb 3.2 oz (75.4 kg)  08/09/17 157 lb 12.8 oz (71.6 kg)    Physical Exam  Constitutional: She is oriented to person, place, and time. She appears well-developed and well-nourished.  Cardiovascular:  Pulses:      Dorsalis pedis pulses are 1+ on the right side, and 1+ on the left side.       Posterior tibial pulses are 1+ on the right side, and 1+ on the left side.  Musculoskeletal:  Her lower extremities are edematous, but equally so. She is not tender in her deep venous system and the ecchymosis does not extend to her posterior calf.   Neurological: She is alert and oriented to person, place, and time.    Skin: Skin is warm and dry. Ecchymosis noted.       Media Information         Document Information   Photos  Right leg   06/21/2018 13:28  Attached To:  Office Visit on 06/21/18 with Trey Sailors, PA-C   Source Information   Maryella Shivers  [facility]       Results for orders placed or performed in visit on 02/21/18  CBC with Differential/Platelet  Result Value Ref Range   WBC 5.1 3.4 - 10.8 x10E3/uL   RBC 5.33 (H) 3.77 - 5.28 x10E6/uL   Hemoglobin 14.0 11.1 - 15.9 g/dL   Hematocrit 40.9 81.1 - 46.6 %   MCV 81 79 - 97 fL   MCH 26.3 (L) 26.6 - 33.0 pg   MCHC 32.6 31.5 - 35.7 g/dL   RDW 91.4 78.2 - 95.6 %   Platelets 159 150 - 379 x10E3/uL   Neutrophils 66 Not Estab. %   Lymphs 19 Not Estab. %   Monocytes 14 Not Estab. %   Eos 1 Not Estab. %   Basos 0 Not Estab. %   Neutrophils Absolute 3.4 1.4 - 7.0 x10E3/uL   Lymphocytes Absolute 1.0  0.7 - 3.1 x10E3/uL   Monocytes Absolute 0.7 0.1 - 0.9 x10E3/uL   EOS (ABSOLUTE) 0.1 0.0 - 0.4 x10E3/uL   Basophils Absolute 0.0 0.0 - 0.2 x10E3/uL   Immature Granulocytes 0 Not Estab. %   Immature Grans (Abs) 0.0 0.0 - 0.1 x10E3/uL  Comprehensive metabolic panel  Result Value Ref Range   Glucose 95 65 - 99 mg/dL   BUN 11 8 - 27 mg/dL   Creatinine, Ser 4.090.83 0.57 - 1.00 mg/dL   GFR calc non Af Amer 64 >59 mL/min/1.73   GFR calc Af Amer 73 >59 mL/min/1.73   BUN/Creatinine Ratio 13 12 - 28   Sodium 144 134 - 144 mmol/L   Potassium 3.7 3.5 - 5.2 mmol/L   Chloride 100 96 - 106 mmol/L   CO2 27 20 - 29 mmol/L   Calcium 9.4 8.7 - 10.3 mg/dL   Total Protein 6.7 6.0 - 8.5 g/dL   Albumin 4.5 3.5 - 4.7 g/dL   Globulin, Total 2.2 1.5 - 4.5 g/dL   Albumin/Globulin Ratio 2.0 1.2 - 2.2   Bilirubin Total 0.6 0.0 - 1.2 mg/dL   Alkaline Phosphatase 75 39 - 117 IU/L   AST 18 0 - 40 IU/L   ALT 10 0 - 32 IU/L  Lipid Panel w/o Chol/HDL Ratio  Result Value Ref Range   Cholesterol, Total 146 100 - 199 mg/dL    Triglycerides 811142 0 - 149 mg/dL   HDL 47 >91>39 mg/dL   VLDL Cholesterol Cal 28 5 - 40 mg/dL   LDL Calculated 71 0 - 99 mg/dL  TSH  Result Value Ref Range   TSH 2.030 0.450 - 4.500 uIU/mL  Bayer DCA Hb A1c Waived  Result Value Ref Range   HB A1C (BAYER DCA - WAIVED) 6.2 <7.0 %      Assessment & Plan:  1. Bruise  Significant bruising on right lower extremity, likely due to the fact that she is on eliquis. Will take weeks to resolve. Can elevate ane treat with ice.   2. Chronic atrial fibrillation (HCC)  Anticoagulated with Eliquis.    Follow up plan: Return if symptoms worsen or fail to improve.  Osvaldo AngstAdriana Mersedes Alber, PA-C [facility]  [facility] 06/22/2018, 6:45 PM

## 2018-06-22 NOTE — Patient Instructions (Signed)

## 2018-07-07 ENCOUNTER — Ambulatory Visit: Payer: Self-pay

## 2018-08-11 ENCOUNTER — Ambulatory Visit: Payer: Medicare Other | Admitting: Family Medicine

## 2018-08-14 ENCOUNTER — Encounter: Payer: Medicare Other | Admitting: Unknown Physician Specialty

## 2018-08-16 ENCOUNTER — Other Ambulatory Visit: Payer: Self-pay | Admitting: Unknown Physician Specialty

## 2018-08-16 NOTE — Telephone Encounter (Signed)
Requested medication (s) are due for refill today: Yes  Requested medication (s) are on the active medication list: Yes  Last refill:02/21/18  Future visit scheduled: Yes     Requested Prescriptions  Pending Prescriptions Disp Refills   diltiazem (CARDIZEM CD) 180 MG 24 hr capsule [Pharmacy Med Name: DILTIAZEM CD 180MG  CAPSULES] 90 capsule 0    Sig: TAKE 1 CAPSULE(180 MG) BY MOUTH DAILY     Off-Protocol Failed - 08/16/2018  5:15 PM      Failed - Medication not assigned to a protocol, review manually.      Passed - Valid encounter within last 12 months    Recent Outpatient Visits          1 month ago Bruise   [facility] Osvaldo Angst M, PA-C   5 months ago Essential hypertension   [facility] Gabriel Cirri, NP   1 year ago Need for influenza vaccination   [facility] Gabriel Cirri, NP   1 year ago Upper respiratory tract infection, unspecified type   [facility] Gabriel Cirri, NP   1 year ago Need for diphtheria-tetanus-pertussis (Tdap) vaccine, adult/adolescent   [facility] Gabriel Cirri, NP      Future Appointments            In 1 week [name], Dorie Rank, NP [facility]         Signed Prescriptions Disp Refills   omeprazole (PRILOSEC) 20 MG capsule 90 capsule 0    Sig: TAKE 1 CAPSULE(20 MG) BY MOUTH DAILY     Gastroenterology: Proton Pump Inhibitors Passed - 08/16/2018  5:15 PM      Passed - Valid encounter within last 12 months    Recent Outpatient Visits          1 month ago [facility] Grannis, Adriana M, PA-C   5 months ago Essential hypertension   [facility] Gabriel Cirri, NP   1 year ago Need for influenza vaccination   [facility] Gabriel Cirri, NP   1 year ago Upper respiratory tract infection, unspecified type   [facility] Gabriel Cirri, NP   1 year ago Need for diphtheria-tetanus-pertussis (Tdap)  vaccine, adult/adolescent   [facility] Gabriel Cirri, NP      Future Appointments            In 1 week [name], Dorie Rank, NP [facility]          traZODone (DESYREL) 50 MG tablet 90 tablet 0    Sig: TAKE 1 TABLET(50 MG) BY MOUTH AT BEDTIME     Psychiatry: Antidepressants - Serotonin Modulator Passed - 08/16/2018  5:15 PM      Passed - Completed PHQ-2 or PHQ-9 in the last 360 days.      Passed - Valid encounter within last 6 months    Recent Outpatient Visits          1 month ago Bruise   [facility] Osvaldo Angst M, PA-C   5 months ago Essential hypertension   [facility] Gabriel Cirri, NP   1 year ago Need for influenza vaccination   [facility] Gabriel Cirri, NP   1 year ago Upper respiratory tract infection, unspecified type   [facility] Gabriel Cirri, NP   1 year ago Need for diphtheria-tetanus-pertussis (Tdap) vaccine, adult/adolescent   [facility] Gabriel Cirri, NP  Future Appointments            In 1 week [name], Dorie Rank, NP [facility]

## 2018-08-21 ENCOUNTER — Encounter: Payer: Self-pay | Admitting: Nurse Practitioner

## 2018-08-22 DIAGNOSIS — I420 Dilated cardiomyopathy: Secondary | ICD-10-CM | POA: Diagnosis not present

## 2018-08-22 DIAGNOSIS — E1121 Type 2 diabetes mellitus with diabetic nephropathy: Secondary | ICD-10-CM | POA: Diagnosis not present

## 2018-08-22 DIAGNOSIS — I340 Nonrheumatic mitral (valve) insufficiency: Secondary | ICD-10-CM | POA: Diagnosis not present

## 2018-08-22 DIAGNOSIS — I4819 Other persistent atrial fibrillation: Secondary | ICD-10-CM | POA: Diagnosis not present

## 2018-08-22 DIAGNOSIS — E7800 Pure hypercholesterolemia, unspecified: Secondary | ICD-10-CM | POA: Diagnosis not present

## 2018-08-25 ENCOUNTER — Ambulatory Visit (INDEPENDENT_AMBULATORY_CARE_PROVIDER_SITE_OTHER): Payer: Medicare Other | Admitting: Nurse Practitioner

## 2018-08-25 ENCOUNTER — Encounter: Payer: Self-pay | Admitting: Nurse Practitioner

## 2018-08-25 VITALS — BP 132/74 | HR 90 | Temp 97.7°F | Ht 60.5 in | Wt 167.0 lb

## 2018-08-25 DIAGNOSIS — I10 Essential (primary) hypertension: Secondary | ICD-10-CM | POA: Diagnosis not present

## 2018-08-25 DIAGNOSIS — Z1329 Encounter for screening for other suspected endocrine disorder: Secondary | ICD-10-CM | POA: Diagnosis not present

## 2018-08-25 DIAGNOSIS — R7301 Impaired fasting glucose: Secondary | ICD-10-CM | POA: Diagnosis not present

## 2018-08-25 DIAGNOSIS — K219 Gastro-esophageal reflux disease without esophagitis: Secondary | ICD-10-CM | POA: Diagnosis not present

## 2018-08-25 DIAGNOSIS — I482 Chronic atrial fibrillation, unspecified: Secondary | ICD-10-CM | POA: Diagnosis not present

## 2018-08-25 DIAGNOSIS — Z23 Encounter for immunization: Secondary | ICD-10-CM

## 2018-08-25 DIAGNOSIS — Z0000 Encounter for general adult medical examination without abnormal findings: Secondary | ICD-10-CM | POA: Diagnosis not present

## 2018-08-25 DIAGNOSIS — Z131 Encounter for screening for diabetes mellitus: Secondary | ICD-10-CM | POA: Diagnosis not present

## 2018-08-25 DIAGNOSIS — I509 Heart failure, unspecified: Secondary | ICD-10-CM | POA: Diagnosis not present

## 2018-08-25 NOTE — Patient Instructions (Addendum)
Influenza (Flu) Vaccine (Inactivated or Recombinant): What You Need to Know 1. Why get vaccinated? Influenza ("flu") is a contagious disease that spreads around the [place] every year, usually between October and May. Flu is caused by influenza viruses, and is spread mainly by coughing, sneezing, and close contact. Anyone can get flu. Flu strikes suddenly and can last several days. Symptoms vary by age, but can include:  fever/chills  sore throat  muscle aches  fatigue  cough  headache  runny or stuffy nose  Flu can also lead to pneumonia and blood infections, and cause diarrhea and seizures in children. If you have a medical condition, such as heart or lung disease, flu can make it worse. Flu is more dangerous for some people. Infants and young children, people 65 years of age and older, pregnant women, and people with certain health conditions or a weakened immune system are at greatest risk. Each year thousands of people in the [place] die from flu, and many more are hospitalized. Flu vaccine can:  keep you from getting flu,  make flu less severe if you do get it, and  keep you from spreading flu to your family and other people. 2. Inactivated and recombinant flu vaccines A dose of flu vaccine is recommended every flu season. Children 6 months through 8 years of age may need two doses during the same flu season. Everyone else needs only one dose each flu season. Some inactivated flu vaccines contain a very small amount of a mercury-based preservative called thimerosal. Studies have not shown thimerosal in vaccines to be harmful, but flu vaccines that do not contain thimerosal are available. There is no live flu virus in flu shots. They cannot cause the flu. There are many flu viruses, and they are always changing. Each year a new flu vaccine is made to protect against three or four viruses that are likely to cause disease in the upcoming flu season. But even when the  vaccine doesn't exactly match these viruses, it may still provide some protection. Flu vaccine cannot prevent:  flu that is caused by a virus not covered by the vaccine, or  illnesses that look like flu but are not.  It takes about 2 weeks for protection to develop after vaccination, and protection lasts through the flu season. 3. Some people should not get this vaccine Tell the person who is giving you the vaccine:  If you have any severe, life-threatening allergies. If you ever had a life-threatening allergic reaction after a dose of flu vaccine, or have a severe allergy to any part of this vaccine, you may be advised not to get vaccinated. Most, but not all, types of flu vaccine contain a small amount of egg protein.  If you ever had Guillain-Barr Syndrome (also called GBS). Some people with a history of GBS should not get this vaccine. This should be discussed with your doctor.  If you are not feeling well. It is usually okay to get flu vaccine when you have a mild illness, but you might be asked to come back when you feel better.  4. Risks of a vaccine reaction With any medicine, including vaccines, there is a chance of reactions. These are usually mild and go away on their own, but serious reactions are also possible. Most people who get a flu shot do not have any problems with it. Minor problems following a flu shot include:  soreness, redness, or swelling where the shot was given  hoarseness  sore,   red or itchy eyes  cough  fever  aches  headache  itching  fatigue  If these problems occur, they usually begin soon after the shot and last 1 or 2 days. More serious problems following a flu shot can include the following:  There may be a small increased risk of Guillain-Barre Syndrome (GBS) after inactivated flu vaccine. This risk has been estimated at 1 or 2 additional cases per million people vaccinated. This is much lower than the risk of severe complications from  flu, which can be prevented by flu vaccine.  Young children who get the flu shot along with pneumococcal vaccine (PCV13) and/or DTaP vaccine at the same time might be slightly more likely to have a seizure caused by fever. Ask your doctor for more information. Tell your doctor if a child who is getting flu vaccine has ever had a seizure.  Problems that could happen after any injected vaccine:  People sometimes faint after a medical procedure, including vaccination. Sitting or lying down for about 15 minutes can help prevent fainting, and injuries caused by a fall. Tell your doctor if you feel dizzy, or have vision changes or ringing in the ears.  Some people get severe pain in the shoulder and have difficulty moving the arm where a shot was given. This happens very rarely.  Any medication can cause a severe allergic reaction. Such reactions from a vaccine are very rare, estimated at about 1 in a million doses, and would happen within a few minutes to a few hours after the vaccination. As with any medicine, there is a very remote chance of a vaccine causing a serious injury or death. The safety of vaccines is always being monitored. For more information, visit: http://www.aguilar.org/ 5. What if there is a serious reaction? What should I look for? Look for anything that concerns you, such as signs of a severe allergic reaction, very high fever, or unusual behavior. Signs of a severe allergic reaction can include hives, swelling of the face and throat, difficulty breathing, a fast heartbeat, dizziness, and weakness. These would start a few minutes to a few hours after the vaccination. What should I do?  If you think it is a severe allergic reaction or other emergency that can't wait, call 9-1-1 and get the person to the nearest hospital. Otherwise, call your doctor.  Reactions should be reported to the Vaccine Adverse Event Reporting System (VAERS). Your doctor should file this report, or you  can do it yourself through the VAERS web site at www.vaers.SamedayNews.es, or by calling 6094730752. ? VAERS does not give medical advice. 6. The National Vaccine Injury Compensation Program The [employer] Vaccine Injury Compensation Program (VICP) is a federal program that was created to compensate people who may have been injured by certain vaccines. Persons who believe they may have been injured by a vaccine can learn about the program and about filing a claim by calling 458-267-6070 or visiting the [place] website at GoldCloset.com.ee. There is a time limit to file a claim for compensation. 7. How can I learn more?  Ask your healthcare provider. He or she can give you the vaccine package insert or suggest other sources of information.  Call your local or state health department.  Contact the Centers for Disease Control and Prevention (CDC): ? Call (540)164-9661 (1-800-CDC-INFO) or ? Visit CDC's website at https://gibson.com/ Vaccine Information Statement, Inactivated Influenza Vaccine (06/21/2014) This information is not intended to replace advice given to you by your health care provider. Make sure  you discuss any questions you have with your health care provider. Document Released: 08/26/2006 Document Revised: 07/22/2016 Document Reviewed: 07/22/2016 Elsevier Interactive Patient Education  2017 Elsevier Inc.   Heart Failure Heart failure means your heart has trouble pumping blood. This makes it hard for your body to work well. Heart failure is usually a long-term (chronic) condition. You must take good care of yourself and follow your doctor's treatment plan. Follow these instructions at home:  Take your heart medicine as told by your doctor. ? Do not stop taking medicine unless your doctor tells you to. ? Do not skip any dose of medicine. ? Refill your medicines before they run out. ? Take other medicines only as told by your doctor or pharmacist.  Stay active if told by  your doctor. The elderly and people with severe heart failure should talk with a doctor about physical activity.  Eat heart-healthy foods. Choose foods that are without trans fat and are low in saturated fat, cholesterol, and salt (sodium). This includes fresh or frozen fruits and vegetables, fish, lean meats, fat-free or low-fat dairy foods, whole grains, and high-fiber foods. Lentils and dried peas and beans (legumes) are also good choices.  Limit salt if told by your doctor.  Cook in a healthy way. Roast, grill, broil, bake, poach, steam, or stir-fry foods.  Limit fluids as told by your doctor.  Weigh yourself every morning. Do this after you pee (urinate) and before you eat breakfast. Write down your weight to give to your doctor.  Take your blood pressure and write it down if your doctor tells you to.  Ask your doctor how to check your pulse. Check your pulse as told.  Lose weight if told by your doctor.  Stop smoking or chewing tobacco. Do not use gum or patches that help you quit without your doctor's approval.  Schedule and go to doctor visits as told.  Nonpregnant women should have no more than 1 drink a day. Men should have no more than 2 drinks a day. Talk to your doctor about drinking alcohol.  Stop illegal drug use.  Stay current with shots (immunizations).  Manage your health conditions as told by your doctor.  Learn to manage your stress.  Rest when you are tired.  If it is really hot outside: ? Avoid intense activities. ? Use air conditioning or fans, or get in a cooler place. ? Avoid caffeine and alcohol. ? Wear loose-fitting, lightweight, and light-colored clothing.  If it is really cold outside: ? Avoid intense activities. ? Layer your clothing. ? Wear mittens or gloves, a hat, and a scarf when going outside. ? Avoid alcohol.  Learn about heart failure and get support as needed.  Get help to maintain or improve your quality of life and your ability  to care for yourself as needed. Contact a doctor if:  You gain weight quickly.  You are more short of breath than usual.  You cannot do your normal activities.  You tire easily.  You cough more than normal, especially with activity.  You have any or more puffiness (swelling) in areas such as your hands, feet, ankles, or belly (abdomen).  You cannot sleep because it is hard to breathe.  You feel like your heart is beating fast (palpitations).  You get dizzy or light-headed when you stand up. Get help right away if:  You have trouble breathing.  There is a change in mental status, such as becoming less alert or not being able  to focus.  You have chest pain or discomfort.  You faint. This information is not intended to replace advice given to you by your health care provider. Make sure you discuss any questions you have with your health care provider. Document Released: 08/10/2008 Document Revised: 04/08/2016 Document Reviewed: 12/18/2012 Elsevier Interactive Patient Education  2017 [employer].

## 2018-08-25 NOTE — Progress Notes (Signed)
Depression screen [facility] 2/9 08/25/2018 02/21/2018 07/07/2017  Decreased Interest 0 1 0  Down, Depressed, Hopeless 0 1 0  PHQ - 2 Score 0 2 0  Altered sleeping 0 0 -  Tired, decreased energy 0 3 -  Change in appetite 0 0 -  Feeling bad or failure about yourself  0 0 -  Trouble concentrating 0 0 -  Moving slowly or fidgety/restless 0 0 -  Suicidal thoughts 0 0 -  PHQ-9 Score 0 5 -

## 2018-08-25 NOTE — Progress Notes (Signed)
BP 132/74   Pulse 90   Temp 97.7 F (36.5 C) (Oral)   Ht 5' 0.5" (1.537 m)   Wt 167 lb (75.8 kg)   LMP  (LMP Unknown)   SpO2 94%   BMI 32.08 kg/m    Subjective:    Patient ID: Melinda Preston, female    DOB: 05/10/1928, 84 y.o.   MRN: [id number]  HPI: Melinda Preston is a 84 y.o. female for annual physical exam.  Chief Complaint  Patient presents with  . Annual Exam    No Complaints   HYPERTENSION: Hypertension status: controlled  Satisfied with current treatment? yes Duration of hypertension: chronic BP monitoring frequency:  rarely BP range: does not perform at home BP medication side effects:  no Medication compliance: excellent compliance Previous BP meds: does not recall, has been on current regimen "for awhile" Aspirin: yes Recurrent headaches: no Visual changes: no Palpitations: no Dyspnea: no Chest pain: no Lower extremity edema: no Dizzy/lightheaded: no  Reports she saw Dr. Josefa Half yesterday and got "a clean bill of health".  States she is doing well with no SOB, CP, or headaches.  Continues to take medications as ordered.  IFG: No current medication.  Last A1C 6.2% in April.  Denies polyuria, polydipsia, polyphagia.  Reports she eats a heart healthy diet at home with little sweets or carbohydrates.    GERD: GERD control status: controlledSatisfied with current treatment? yes Heartburn frequency:  Medication side effects: no  Medication compliance: stable Previous GERD medications: Antacid use frequency:   Duration:  Nature:  Location:  Heartburn duration:  Alleviatiating factors:   Aggravating factors:  Dysphagia: no Odynophagia:  no Hematemesis: no Blood in stool: no EGD: no Denies any issues with heart burn.  Takes medication daily.  ATRIAL FIBRILLATION Atrial fibrillation status: controlled Satisfied with current treatment: yes  Medication side effects:  no Medication compliance: excellent compliance Etiology of  atrial fibrillation:  Palpitations:  no Chest pain:  no Dyspnea on exertion:  no Orthopnea:  no Syncope:  no Edema:  no Ventricular rate control: Carvedilol Anti-coagulation: Eliquis  Denies blood in stool or bleeding episodes.  Relevant past medical, surgical, family and social history reviewed and updated as indicated. Interim medical history since our last visit reviewed. Allergies and medications reviewed and updated.  Review of Systems  Constitutional: Negative for activity change, appetite change, fatigue, fever and unexpected weight change.  HENT: Negative.   Eyes: Negative.   Respiratory: Negative for cough, chest tightness and shortness of breath.   Cardiovascular: Negative for chest pain, palpitations and leg swelling.  Gastrointestinal: Negative for abdominal distention, abdominal pain, constipation and diarrhea.  Endocrine: Negative.   Genitourinary: Negative.   Musculoskeletal: Negative.   Skin: Negative.   Neurological: Negative for dizziness, numbness and headaches.  Psychiatric/Behavioral: Negative.    Per HPI unless specifically indicated above     Objective:    BP 132/74   Pulse 90   Temp 97.7 F (36.5 C) (Oral)   Ht 5' 0.5" (1.537 m)   Wt 167 lb (75.8 kg)   LMP  (LMP Unknown)   SpO2 94%   BMI 32.08 kg/m   Wt Readings from Last 3 Encounters:  08/25/18 167 lb (75.8 kg)  06/21/18 169 lb (76.7 kg)  02/21/18 166 lb 3.2 oz (75.4 kg)    Physical Exam  Constitutional: She is oriented to person, place, and time. She appears well-developed and well-nourished.  HENT:  Head: Normocephalic and atraumatic.  Right Ear: External  ear normal.  Left Ear: External ear normal.  Nose: Nose normal.  Mouth/Throat: Oropharynx is clear and moist.  Eyes: Pupils are equal, round, and reactive to light. EOM are normal.  Neck: Normal range of motion. Neck supple. No JVD present. Carotid bruit is not present.  Cardiovascular: Normal rate, normal heart sounds and intact  distal pulses. An irregularly irregular rhythm present.  Pulmonary/Chest: Effort normal and breath sounds normal.  Abdominal: Soft. Bowel sounds are normal.  Musculoskeletal: Normal range of motion.  Neurological: She is alert and oriented to person, place, and time.  Skin: Skin is warm and dry.  Psychiatric: She has a normal mood and affect. Her behavior is normal.   Diabetic Foot Exam - Simple   Simple Foot Form Visual Inspection See comments:  Yes Sensation Testing Intact to touch and monofilament testing bilaterally:  Yes Pulse Check See comments:  Yes Comments Varicosities to bilateral feet.  Intact sensation bilaterally.  Pulses palpable to D, decreased to T.     Results for orders placed or performed in visit on 02/21/18  CBC with Differential/Platelet  Result Value Ref Range   WBC 5.1 3.4 - 10.8 x10E3/uL   RBC 5.33 (H) 3.77 - 5.28 x10E6/uL   Hemoglobin 14.0 11.1 - 15.9 g/dL   Hematocrit 43.0 34.0 - 46.6 %   MCV 81 79 - 97 fL   MCH 26.3 (L) 26.6 - 33.0 pg   MCHC 32.6 31.5 - 35.7 g/dL   RDW 15.4 12.3 - 15.4 %   Platelets 159 150 - 379 x10E3/uL   Neutrophils 66 Not Estab. %   Lymphs 19 Not Estab. %   Monocytes 14 Not Estab. %   Eos 1 Not Estab. %   Basos 0 Not Estab. %   Neutrophils Absolute 3.4 1.4 - 7.0 x10E3/uL   Lymphocytes Absolute 1.0 0.7 - 3.1 x10E3/uL   Monocytes Absolute 0.7 0.1 - 0.9 x10E3/uL   EOS (ABSOLUTE) 0.1 0.0 - 0.4 x10E3/uL   Basophils Absolute 0.0 0.0 - 0.2 x10E3/uL   Immature Granulocytes 0 Not Estab. %   Immature Grans (Abs) 0.0 0.0 - 0.1 x10E3/uL  Comprehensive metabolic panel  Result Value Ref Range   Glucose 95 65 - 99 mg/dL   BUN 11 8 - 27 mg/dL   Creatinine, Ser 0.83 0.57 - 1.00 mg/dL   GFR calc non Af Amer 64 >59 mL/min/1.73   GFR calc Af Amer 73 >59 mL/min/1.73   BUN/Creatinine Ratio 13 12 - 28   Sodium 144 134 - 144 mmol/L   Potassium 3.7 3.5 - 5.2 mmol/L   Chloride 100 96 - 106 mmol/L   CO2 27 20 - 29 mmol/L   Calcium 9.4 8.7  - 10.3 mg/dL   Total Protein 6.7 6.0 - 8.5 g/dL   Albumin 4.5 3.5 - 4.7 g/dL   Globulin, Total 2.2 1.5 - 4.5 g/dL   Albumin/Globulin Ratio 2.0 1.2 - 2.2   Bilirubin Total 0.6 0.0 - 1.2 mg/dL   Alkaline Phosphatase 75 39 - 117 IU/L   AST 18 0 - 40 IU/L   ALT 10 0 - 32 IU/L  Lipid Panel w/o Chol/HDL Ratio  Result Value Ref Range   Cholesterol, Total 146 100 - 199 mg/dL   Triglycerides 142 0 - 149 mg/dL   HDL 47 >39 mg/dL   VLDL Cholesterol Cal 28 5 - 40 mg/dL   LDL Calculated 71 0 - 99 mg/dL  TSH  Result Value Ref Range   TSH 2.030 0.450 -  4.500 uIU/mL  Bayer DCA Hb A1c Waived  Result Value Ref Range   HB A1C (BAYER DCA - WAIVED) 6.2 <7.0 %      Assessment & Plan:   Problem List Items Addressed This Visit      Cardiovascular and Mediastinum   Essential hypertension    Chronic, stable on current medication regimen.  Continue to collaborate with cardiology.  CBC, CMP, TSH today.      Chronic atrial fibrillation    Chronic, ongoing.  Continue Eliquis for stroke prevention and collaboration with cardiology.        Digestive   GERD without esophagitis    Chronic, well-controlled on current regimen.  Continue Omeprazole, which also offers GI protection with Eliquis.        Endocrine   IFG (impaired fasting glucose)    A1C drawn today.  Last A1C within normal range for age <8.  Continue to monitor.       Other Visit Diagnoses    Need for influenza vaccination    -  Primary   Relevant Orders   Flu vaccine HIGH DOSE PF (Fluzone High dose) (Completed)   Annual physical exam       Mammogram ordered per patient request and annual labs   Relevant Orders   CBC   Comp Met (CMET)   TSH   Magnesium   HgB A1c   MM Digital Screening       Follow up plan: Return in about 6 months (around 02/24/2019), or if symptoms worsen or fail to improve, for for HTN and T2DM.

## 2018-08-25 NOTE — Assessment & Plan Note (Signed)
Chronic, stable on current medication regimen.  Continue to collaborate with cardiology.  CBC, CMP, TSH today.

## 2018-08-25 NOTE — Assessment & Plan Note (Signed)
A1C drawn today.  Last A1C within normal range for age <8.  Continue to monitor.

## 2018-08-25 NOTE — Assessment & Plan Note (Signed)
Chronic, well-controlled on current regimen.  Continue Omeprazole, which also offers GI protection with Eliquis.

## 2018-08-25 NOTE — Assessment & Plan Note (Signed)
Chronic, ongoing.  Continue Eliquis for stroke prevention and collaboration with cardiology.

## 2018-08-26 LAB — CBC
HEMATOCRIT: 45.1 % (ref 34.0–46.6)
Hemoglobin: 14.4 g/dL (ref 11.1–15.9)
MCH: 25 pg — ABNORMAL LOW (ref 26.6–33.0)
MCHC: 31.9 g/dL (ref 31.5–35.7)
MCV: 78 fL — ABNORMAL LOW (ref 79–97)
Platelets: 200 10*3/uL (ref 150–450)
RBC: 5.77 x10E6/uL — ABNORMAL HIGH (ref 3.77–5.28)
RDW: 14 % (ref 12.3–15.4)
WBC: 7.1 10*3/uL (ref 3.4–10.8)

## 2018-08-26 LAB — COMPREHENSIVE METABOLIC PANEL
ALK PHOS: 71 IU/L (ref 39–117)
ALT: 8 IU/L (ref 0–32)
AST: 13 IU/L (ref 0–40)
Albumin/Globulin Ratio: 1.8 (ref 1.2–2.2)
Albumin: 4.6 g/dL (ref 3.5–4.7)
BILIRUBIN TOTAL: 0.5 mg/dL (ref 0.0–1.2)
BUN/Creatinine Ratio: 20 (ref 12–28)
BUN: 17 mg/dL (ref 8–27)
CHLORIDE: 97 mmol/L (ref 96–106)
CO2: 29 mmol/L (ref 20–29)
CREATININE: 0.84 mg/dL (ref 0.57–1.00)
Calcium: 9.5 mg/dL (ref 8.7–10.3)
GFR calc Af Amer: 72 mL/min/{1.73_m2} (ref >59)
GFR calc non Af Amer: 62 mL/min/{1.73_m2} (ref >59)
GLOBULIN, TOTAL: 2.5 g/dL (ref 1.5–4.5)
GLUCOSE: 111 mg/dL — AB (ref 65–99)
Potassium: 3.8 mmol/L (ref 3.5–5.2)
SODIUM: 146 mmol/L — AB (ref 134–144)
Total Protein: 7.1 g/dL (ref 6.0–8.5)

## 2018-08-26 LAB — TSH: TSH: 1.86 u[IU]/mL (ref 0.450–4.500)

## 2018-08-26 LAB — MAGNESIUM: MAGNESIUM: 1.7 mg/dL (ref 1.6–2.3)

## 2018-08-26 LAB — HEMOGLOBIN A1C
Est. average glucose Bld gHb Est-mCnc: 134 mg/dL
Hgb A1c MFr Bld: 6.3 % — ABNORMAL HIGH (ref 4.8–5.6)

## 2018-09-06 ENCOUNTER — Ambulatory Visit
Admission: RE | Admit: 2018-09-06 | Discharge: 2018-09-06 | Disposition: A | Discharge status: Home / Self Care | Payer: Medicare Other | Source: Ambulatory Visit | Attending: Nurse Practitioner | Admitting: Nurse Practitioner

## 2018-09-06 DIAGNOSIS — Z1231 Encounter for screening mammogram for malignant neoplasm of breast: Secondary | ICD-10-CM | POA: Insufficient documentation

## 2018-09-06 DIAGNOSIS — Z0000 Encounter for general adult medical examination without abnormal findings: Secondary | ICD-10-CM

## 2018-09-10 ENCOUNTER — Other Ambulatory Visit: Payer: Self-pay | Admitting: Unknown Physician Specialty

## 2018-09-11 NOTE — Telephone Encounter (Signed)
Requested Prescriptions  Pending Prescriptions Disp Refills  . ELIQUIS 2.5 MG TABS tablet [Pharmacy Med Name: ELIQUIS 2.5MG  TABLETS] 180 tablet 1    Sig: TAKE 1 TABLET BY MOUTH TWICE DAILY     Hematology:  Anticoagulants Passed - 09/10/2018  3:17 PM      Passed - HGB in normal range and within 360 days    Hemoglobin  Date Value Ref Range Status  08/25/2018 14.4 11.1 - 15.9 g/dL Final         Passed - PLT in normal range and within 360 days    Platelets  Date Value Ref Range Status  08/25/2018 200 150 - 450 x10E3/uL Final         Passed - HCT in normal range and within 360 days    Hematocrit  Date Value Ref Range Status  08/25/2018 45.1 34.0 - 46.6 % Final         Passed - Cr in normal range and within 360 days    Creatinine  Date Value Ref Range Status  06/20/2012 0.75 0.60 - 1.30 mg/dL Final   Creatinine, Ser  Date Value Ref Range Status  08/25/2018 0.84 0.57 - 1.00 mg/dL Final         Passed - Valid encounter within last 12 months    Recent Outpatient Visits          2 weeks ago Need for influenza vaccination   [facility] Aura Dials T, NP   2 months ago [facility] [place], PA-C   6 months ago Essential hypertension   [facility] Gabriel Cirri, NP   1 year ago Need for influenza vaccination   [facility] Gabriel Cirri, NP   1 year ago Upper respiratory tract infection, unspecified type   [facility] Gabriel Cirri, NP      Future Appointments            In 5 months [name], Dorie Rank, NP [facility]

## 2018-09-29 ENCOUNTER — Other Ambulatory Visit: Payer: Self-pay | Admitting: Family Medicine

## 2018-09-29 DIAGNOSIS — M25473 Effusion, unspecified ankle: Secondary | ICD-10-CM

## 2018-10-09 ENCOUNTER — Other Ambulatory Visit: Payer: Self-pay | Admitting: Unknown Physician Specialty

## 2018-11-17 ENCOUNTER — Other Ambulatory Visit: Payer: Self-pay | Admitting: Unknown Physician Specialty

## 2018-11-17 ENCOUNTER — Other Ambulatory Visit: Payer: Self-pay | Admitting: Physician Assistant

## 2018-11-24 ENCOUNTER — Other Ambulatory Visit: Payer: Self-pay | Admitting: Physician Assistant

## 2018-12-09 ENCOUNTER — Other Ambulatory Visit: Payer: Self-pay | Admitting: Unknown Physician Specialty

## 2018-12-11 NOTE — Telephone Encounter (Signed)
Requested medication (s) are due for refill today: Yes  Requested medication (s) are on the active medication list: Yes  Last refill:  12/12/17  Future visit scheduled: Yes  Notes to clinic:  Rx will expire on 12/12/18, new Rx needed.     Requested Prescriptions  Pending Prescriptions Disp Refills   atorvastatin (LIPITOR) 20 MG tablet [Pharmacy Med Name: ATORVASTATIN 20MG  TABLETS] 90 tablet 3    Sig: TAKE 1 TABLET BY MOUTH EVERY NIGHT AT BEDTIME     Cardiovascular:  Antilipid - Statins Passed - 12/09/2018  4:36 PM      Passed - Total Cholesterol in normal range and within 360 days    Cholesterol, Total  Date Value Ref Range Status  02/21/2018 146 100 - 199 mg/dL Final   Cholesterol Piccolo, Waived  Date Value Ref Range Status  12/22/2015 162 <200 mg/dL Final    Comment:                            Desirable                <200                         Borderline High      200- 239                         High                     >239          Passed - LDL in normal range and within 360 days    LDL Calculated  Date Value Ref Range Status  02/21/2018 71 0 - 99 mg/dL Final         Passed - HDL in normal range and within 360 days    HDL  Date Value Ref Range Status  02/21/2018 47 >39 mg/dL Final         Passed - Triglycerides in normal range and within 360 days    Triglycerides  Date Value Ref Range Status  02/21/2018 142 0 - 149 mg/dL Final   Triglycerides Piccolo,Waived  Date Value Ref Range Status  12/22/2015 137 <150 mg/dL Final    Comment:                            Normal                   <150                         Borderline High     150 - 199                         High                200 - 499                         Very High                >499          Passed - Patient is not pregnant      Passed - Valid encounter within last 12 months  Recent Outpatient Visits          3 months ago Need for influenza vaccination   [facility]  Marjie Skiff, NP   5 months ago Bruise   [facility] Osvaldo Angst M, [place]   9 months ago Essential hypertension   [facility] Gabriel Cirri, NP   1 year ago Need for influenza vaccination   [facility] Gabriel Cirri, NP   1 year ago Upper respiratory tract infection, unspecified type   [facility] Gabriel Cirri, NP      Future Appointments            In 2 months [name], Dorie Rank, NP [employer]

## 2019-01-05 ENCOUNTER — Other Ambulatory Visit: Payer: Self-pay | Admitting: Unknown Physician Specialty

## 2019-01-05 NOTE — Telephone Encounter (Signed)
Requested Prescriptions  Pending Prescriptions Disp Refills  . sertraline (ZOLOFT) 25 MG tablet [Pharmacy Med Name: SERTRALINE 25MG  TABLETS] 90 tablet 0    Sig: TAKE 1 TABLET(25 MG) BY MOUTH DAILY     Psychiatry:  Antidepressants - SSRI Passed - 01/05/2019  9:53 AM      Passed - Completed PHQ-2 or PHQ-9 in the last 360 days.      Passed - Valid encounter within last 6 months    Recent Outpatient Visits          4 months ago Need for influenza vaccination   [facility] Marjie Skiff, NP   6 months ago Bruise   [facility] Osvaldo Angst M, PA-C   10 months ago Essential hypertension   [facility] Gabriel Cirri, NP   1 year ago Need for influenza vaccination   [facility] Gabriel Cirri, NP   1 year ago Upper respiratory tract infection, unspecified type   [facility] Gabriel Cirri, NP      Future Appointments            In 1 month [name], Dorie Rank, NP [employer]

## 2019-01-31 ENCOUNTER — Telehealth: Payer: Self-pay | Admitting: Nurse Practitioner

## 2019-01-31 NOTE — Telephone Encounter (Unsigned)
Copied from CRM [id number]. Topic: Medicare AWV >> Jan 31, 2019  3:47 PM Earlyne Iba wrote: Called to schedule Medicare Annual Wellness Visit with the Nurse Health Advisor. Home phone number would ring, then switch to busy.  Called mobile number.  No answer.  Left message to call Misty Stanley at [phone].  If patient returns call, please note: their last AWV was on 07/07/2017, please schedule AWV with [facility] any date AFTER 07/07/2018.  Thank you! For any questions please contact: Trixie Rude at [phone] or Skype [email or web address]

## 2019-02-06 ENCOUNTER — Telehealth: Payer: Self-pay | Admitting: Nurse Practitioner

## 2019-02-06 MED ORDER — BENZONATATE 100 MG PO CAPS: 100.0000 mg | ORAL_CAPSULE | Freq: Two times a day (BID) | ORAL | 0 refills | Status: DC | PRN

## 2019-02-06 NOTE — Telephone Encounter (Signed)
Copied from CRM [id number]. Topic: General - Inquiry >> Feb 06, 2019 11:15 AM Lynne Logan D wrote: Reason for CRM: Pt stated she has been having a coughing spell and would like to know if something can be sent to her pharmacy. She stated she has no other symptoms and feels fine other than having this cough. She is requesting Benzonatate 100mg  as she has taken it in the past and it worked well. Please advise. CB#[phone]  [id number] [place], [place] - [address] AT [facility] [address] [phone] (Phone) [phone] (Fax)

## 2019-02-06 NOTE — Telephone Encounter (Signed)
I will send this in, but can you please call her and let her know if worsening symptoms she will need to be seen right away.

## 2019-02-20 ENCOUNTER — Other Ambulatory Visit: Payer: Self-pay | Admitting: Nurse Practitioner

## 2019-02-20 ENCOUNTER — Telehealth: Payer: Self-pay | Admitting: Nurse Practitioner

## 2019-02-20 NOTE — Telephone Encounter (Signed)
Requested Prescriptions  Pending Prescriptions Disp Refills  . omeprazole (PRILOSEC) 20 MG capsule [Pharmacy Med Name: OMEPRAZOLE 20MG  CAPSULES] 90 capsule 0    Sig: TAKE 1 CAPSULE(20 MG) BY MOUTH DAILY     Gastroenterology: Proton Pump Inhibitors Passed - 02/20/2019 10:34 AM      Passed - Valid encounter within last 12 months    Recent Outpatient Visits          5 months ago Need for influenza vaccination   [facility] Marjie Skiff, NP   8 months ago Bruise   [facility] Osvaldo Angst M, [place]   12 months ago Essential hypertension   [facility] Gabriel Cirri, NP   1 year ago Need for influenza vaccination   [place] Gabriel Cirri, NP   1 year ago Upper respiratory tract infection, unspecified type   [facility] Gabriel Cirri, NP      Future Appointments            In 1 week [name], Dorie Rank, NP [employer]

## 2019-02-20 NOTE — Telephone Encounter (Signed)
Requested Prescriptions  Refused Prescriptions Disp Refills  . traZODone (DESYREL) 50 MG tablet [Pharmacy Med Name: TRAZODONE 50MG  TABLETS] 90 tablet 0    Sig: TAKE 1 TABLET(50 MG) BY MOUTH AT BEDTIME     Psychiatry: Antidepressants - Serotonin Modulator Passed - 02/20/2019  5:19 PM      Passed - Completed PHQ-2 or PHQ-9 in the last 360 days.      Passed - Valid encounter within last 6 months    Recent Outpatient Visits          5 months ago Need for influenza vaccination   [facility] Marjie Skiff, NP   8 months ago Bruise   [facility] Osvaldo Angst M, [place]   12 months ago Essential hypertension   [facility] Gabriel Cirri, NP   1 year ago Need for influenza vaccination   [facility] Gabriel Cirri, NP   1 year ago Upper respiratory tract infection, unspecified type   [facility] Gabriel Cirri, NP      Future Appointments            In 1 week [name], Dorie Rank, NP [employer]

## 2019-02-21 ENCOUNTER — Other Ambulatory Visit: Payer: Self-pay | Admitting: Nurse Practitioner

## 2019-02-21 MED ORDER — TRAZODONE HCL 50 MG PO TABS: ORAL_TABLET | ORAL | 0 refills | Status: DC

## 2019-02-21 NOTE — Progress Notes (Signed)
Trazodone refill sent ?

## 2019-03-02 ENCOUNTER — Other Ambulatory Visit: Payer: Self-pay

## 2019-03-02 ENCOUNTER — Encounter: Payer: Self-pay | Admitting: Nurse Practitioner

## 2019-03-02 ENCOUNTER — Ambulatory Visit (INDEPENDENT_AMBULATORY_CARE_PROVIDER_SITE_OTHER): Payer: Medicare Other | Admitting: Nurse Practitioner

## 2019-03-02 DIAGNOSIS — I482 Chronic atrial fibrillation, unspecified: Secondary | ICD-10-CM

## 2019-03-02 DIAGNOSIS — I10 Essential (primary) hypertension: Secondary | ICD-10-CM | POA: Diagnosis not present

## 2019-03-02 DIAGNOSIS — E7800 Pure hypercholesterolemia, unspecified: Secondary | ICD-10-CM

## 2019-03-02 DIAGNOSIS — R7301 Impaired fasting glucose: Secondary | ICD-10-CM | POA: Diagnosis not present

## 2019-03-02 NOTE — Assessment & Plan Note (Signed)
Continue to monitor A1C, obtain next visit.  Continue diet control.

## 2019-03-02 NOTE — Patient Instructions (Signed)
Prediabetes Eating Plan  Prediabetes is a condition that causes blood sugar (glucose) levels to be higher than normal. This increases the risk for developing diabetes. In order to prevent diabetes from developing, your health care provider may recommend a diet and other lifestyle changes to help you:  · Control your blood glucose levels.  · Improve your cholesterol levels.  · Manage your blood pressure.  Your health care provider may recommend working with a diet and nutrition specialist (dietitian) to make a meal plan that is best for you.  What are tips for following this plan?  Lifestyle  · Set weight loss goals with the help of your health care team. It is recommended that most people with prediabetes lose 7% of their current body weight.  · Exercise for at least 30 minutes at least 5 days a week.  · Attend a support group or seek ongoing support from a mental health counselor.  · Take over-the-counter and prescription medicines only as told by your health care provider.  Reading food labels  · Read food labels to check the amount of fat, salt (sodium), and sugar in prepackaged foods. Avoid foods that have:  ? Saturated fats.  ? Trans fats.  ? Added sugars.  · Avoid foods that have more than 300 milligrams (mg) of sodium per serving. Limit your daily sodium intake to less than 2,300 mg each day.  Shopping  · Avoid buying pre-made and processed foods.  Cooking  · Cook with olive oil. Do not use butter, lard, or ghee.  · Bake, broil, grill, or boil foods. Avoid frying.  Meal planning    · Work with your dietitian to develop an eating plan that is right for you. This may include:  ? Tracking how many calories you take in. Use a food diary, notebook, or mobile application to track what you eat at each meal.  ? Using the glycemic index (GI) to plan your meals. The index tells you how quickly a food will raise your blood glucose. Choose low-GI foods. These foods take a longer time to raise blood glucose.  · Consider  following a Mediterranean diet. This diet includes:  ? Several servings each day of fresh fruits and vegetables.  ? Eating fish at least twice a week.  ? Several servings each day of whole grains, beans, nuts, and seeds.  ? Using olive oil instead of other fats.  ? Moderate alcohol consumption.  ? Eating small amounts of red meat and whole-fat dairy.  · If you have high blood pressure, you may need to limit your sodium intake or follow a diet such as the DASH eating plan. DASH is an eating plan that aims to lower high blood pressure.  What foods are recommended?  The items listed below may not be a complete list. Talk with your dietitian about what dietary choices are best for you.  Grains  Whole grains, such as whole-wheat or whole-grain breads, crackers, cereals, and pasta. Unsweetened oatmeal. Bulgur. Barley. Quinoa. Brown rice. Corn or whole-wheat flour tortillas or taco shells.  Vegetables  Lettuce. Spinach. Peas. Beets. Cauliflower. Cabbage. Broccoli. Carrots. Tomatoes. Squash. Eggplant. Herbs. Peppers. Onions. Cucumbers. Brussels sprouts.  Fruits  Berries. Bananas. Apples. Oranges. Grapes. Papaya. Mango. Pomegranate. Kiwi. Grapefruit. Cherries.  Meats and other protein foods  Seafood. Poultry without skin. Lean cuts of pork and beef. Tofu. Eggs. Nuts. Beans.  Dairy  Low-fat or fat-free dairy products, such as yogurt, cottage cheese, and cheese.  Beverages  Water.   Tea. Coffee. Sugar-free or diet soda. Seltzer water. Lowfat or no-fat milk. Milk alternatives, such as soy or almond milk.  Fats and oils  Olive oil. Canola oil. Sunflower oil. Grapeseed oil. Avocado. Walnuts.  Sweets and desserts  Sugar-free or low-fat pudding. Sugar-free or low-fat ice cream and other frozen treats.  Seasoning and other foods  Herbs. Sodium-free spices. Mustard. Relish. Low-fat, low-sugar ketchup. Low-fat, low-sugar barbecue sauce. Low-fat or fat-free mayonnaise.  What foods are not recommended?  The items listed below may not be a  complete list. Talk with your dietitian about what dietary choices are best for you.  Grains  Refined white flour and flour products, such as bread, pasta, snack foods, and cereals.  Vegetables  Canned vegetables. Frozen vegetables with butter or cream sauce.  Fruits  Fruits canned with syrup.  Meats and other protein foods  Fatty cuts of meat. Poultry with skin. Breaded or fried meat. Processed meats.  Dairy  Full-fat yogurt, cheese, or milk.  Beverages  Sweetened drinks, such as sweet iced tea and soda.  Fats and oils  Butter. Lard. Ghee.  Sweets and desserts  Baked goods, such as cake, cupcakes, pastries, cookies, and cheesecake.  Seasoning and other foods  Spice mixes with added salt. Ketchup. Barbecue sauce. Mayonnaise.  Summary  · To prevent diabetes from developing, you may need to make diet and other lifestyle changes to help control blood sugar, improve cholesterol levels, and manage your blood pressure.  · Set weight loss goals with the help of your health care team. It is recommended that most people with prediabetes lose 7 percent of their current body weight.  · Consider following a Mediterranean diet that includes plenty of fresh fruits and vegetables, whole grains, beans, nuts, seeds, fish, lean meat, low-fat dairy, and healthy oils.  This information is not intended to replace advice given to you by your health care provider. Make sure you discuss any questions you have with your health care provider.  Document Released: 03/18/2015 Document Revised: 01/05/2017 Document Reviewed: 01/05/2017  Elsevier Interactive Patient Education © 2019 [employer]

## 2019-03-02 NOTE — Assessment & Plan Note (Signed)
Chronic, ongoing.  Well-controlled for age per numbers at home. Continue current medication regimen and collaboration with cardiology.

## 2019-03-02 NOTE — Assessment & Plan Note (Signed)
Chronic, ongoing.  Continue current medication regimen and collaboration with cardiology. 

## 2019-03-02 NOTE — Progress Notes (Signed)
BP 108/86   LMP  (LMP Unknown)    Subjective:    Patient ID: Melinda Preston, female    DOB: 1928-01-27, 85 y.o.   MRN: [id number]  HPI: Melinda OreBlanche Ector Schnackenberg is a 85 y.o. female  Chief Complaint  Patient presents with  . Hypertension  . Diabetes    . This visit was completed via telephone due to the restrictions of the COVID-19 pandemic. All issues as above were discussed and addressed but no physical exam was performed. If it was felt that the patient should be evaluated in the office, they were directed there. The patient verbally consented to this visit. Patient was unable to complete an audio/visual visit due to Lack of equipment. Due to the catastrophic nature of the COVID-19 pandemic, this visit was done through audio contact only. . Location of the patient: home . Location of the provider: home . Those involved with this call:  . Provider: Aura DialsJolene , DNP . CMA: Tiffany Reel, CMA . Front Desk/Registration: Adela Portshristan Williamson  . Time spent on call: 15 minutes on the phone discussing health concerns. 10 minutes total spent in review of patient's record and preparation of their chart.   HYPERTENSION / HYPERLIPIDEMIA Followed by cardiology, Dr. Cassie FreerParachos.  Last seen 08/22/18.  She sees cardiology again on Monday. Satisfied with current treatment? yes Duration of hypertension: chronic BP monitoring frequency: daily BP range: 110-140/70-80 BP medication side effects: no Duration of hyperlipidemia: chronic Cholesterol medication side effects: no Cholesterol supplements: none Medication compliance: good compliance Aspirin: no Recent stressors: no Recurrent headaches: no Visual changes: no Palpitations: no Dyspnea: no Chest pain: no Lower extremity edema: no Dizzy/lightheaded: no   ATRIAL FIBRILLATION Atrial fibrillation status: stable Satisfied with current treatment: yes  Medication side effects:  no Medication compliance: good compliance Etiology of  atrial fibrillation:  Palpitations:  no Chest pain:  no Dyspnea on exertion:  no Orthopnea:  no Syncope:  no Edema:  no Ventricular rate control: B-blocker Anti-coagulation: long acting  PREDIABETES: A1C on last visit remains 6.3%.  No current medications and she remains diet controlled.  Denies polyuria, polydipsia, and polyphagia.  She continues to focus on diet reporting she has cut out her [facility] Dew as was instructed last visit and is drinking more water.  Relevant past medical, surgical, family and social history reviewed and updated as indicated. Interim medical history since our last visit reviewed. Allergies and medications reviewed and updated.  Review of Systems  Constitutional: Negative for activity change, appetite change, diaphoresis, fatigue and fever.  Respiratory: Negative for cough, chest tightness and shortness of breath.   Cardiovascular: Negative for chest pain, palpitations and leg swelling.  Gastrointestinal: Negative for abdominal distention, abdominal pain, constipation, diarrhea, nausea and vomiting.  Endocrine: Negative for cold intolerance, heat intolerance, polydipsia, polyphagia and polyuria.  Neurological: Negative for dizziness, syncope, weakness, light-headedness, numbness and headaches.  Psychiatric/Behavioral: Negative.     Per HPI unless specifically indicated above     Objective:    BP 108/86   LMP  (LMP Unknown)   Wt Readings from Last 3 Encounters:  08/25/18 167 lb (75.8 kg)  06/21/18 169 lb (76.7 kg)  02/21/18 166 lb 3.2 oz (75.4 kg)    Physical Exam   Unable to perform due to patient with no visual access  Results for orders placed or performed in visit on 08/29/18  HM DIABETES EYE EXAM  Result Value Ref Range   HM Diabetic Eye Exam Retinopathy (A) No Retinopathy  Assessment & Plan:   Problem List Items Addressed This Visit      Cardiovascular and Mediastinum   Essential hypertension    Chronic, ongoing.   Well-controlled for age per numbers at home. Continue current medication regimen and collaboration with cardiology.        Chronic atrial fibrillation    Chronic, ongoing.  Continue current medication regimen and collaboration with cardiology.          Endocrine   IFG (impaired fasting glucose)    Continue to monitor A1C, obtain next visit.  Continue diet control.        Other   Hypercholesterolemia    Chronic, ongoing.  Continue current medication regimen.  Labs in 3 months.         I discussed the assessment and treatment plan with the patient. The patient was provided an opportunity to ask questions and all were answered. The patient agreed with the plan and demonstrated an understanding of the instructions.   The patient was advised to call back or seek an in-person evaluation if the symptoms worsen or if the condition fails to improve as anticipated.   Follow up plan: Return in about 3 months (around 06/01/2019) for HTN/HLD, A Fib, Depression, IFG.

## 2019-03-02 NOTE — Assessment & Plan Note (Signed)
Chronic, ongoing.  Continue current medication regimen.  Labs in 3 months.

## 2019-03-06 DIAGNOSIS — I10 Essential (primary) hypertension: Secondary | ICD-10-CM | POA: Diagnosis not present

## 2019-03-06 DIAGNOSIS — I340 Nonrheumatic mitral (valve) insufficiency: Secondary | ICD-10-CM | POA: Diagnosis not present

## 2019-03-06 DIAGNOSIS — E7800 Pure hypercholesterolemia, unspecified: Secondary | ICD-10-CM | POA: Diagnosis not present

## 2019-03-06 DIAGNOSIS — I4819 Other persistent atrial fibrillation: Secondary | ICD-10-CM | POA: Diagnosis not present

## 2019-03-06 DIAGNOSIS — I420 Dilated cardiomyopathy: Secondary | ICD-10-CM | POA: Diagnosis not present

## 2019-03-09 ENCOUNTER — Other Ambulatory Visit: Payer: Self-pay | Admitting: Nurse Practitioner

## 2019-03-09 NOTE — Telephone Encounter (Signed)
Requested Prescriptions  Pending Prescriptions Disp Refills  . ELIQUIS 2.5 MG TABS tablet [Pharmacy Med Name: ELIQUIS 2.5MG  TABLETS] 180 tablet 0    Sig: TAKE 1 TABLET BY MOUTH TWICE DAILY     Hematology:  Anticoagulants Passed - 03/09/2019 10:15 AM      Passed - HGB in normal range and within 360 days    Hemoglobin  Date Value Ref Range Status  08/25/2018 14.4 11.1 - 15.9 g/dL Final         Passed - PLT in normal range and within 360 days    Platelets  Date Value Ref Range Status  08/25/2018 200 150 - 450 x10E3/uL Final         Passed - HCT in normal range and within 360 days    Hematocrit  Date Value Ref Range Status  08/25/2018 45.1 34.0 - 46.6 % Final         Passed - Cr in normal range and within 360 days    Creatinine  Date Value Ref Range Status  06/20/2012 0.75 0.60 - 1.30 mg/dL Final   Creatinine, Ser  Date Value Ref Range Status  08/25/2018 0.84 0.57 - 1.00 mg/dL Final         Passed - Valid encounter within last 12 months    Recent Outpatient Visits          1 week ago Chronic atrial fibrillation   [facility] [place], [place] T, NP   6 months ago Need for influenza vaccination   [facility] [place], Corrie Dandy T, NP   8 months ago [employer], Lavella Hammock, PA-C   1 year ago Essential hypertension   [facility] Gabriel Cirri, NP   1 year ago Need for influenza vaccination   [facility] Gabriel Cirri, NP      Future Appointments            In 2 months [name], Dorie Rank, NP [employer], [facility]

## 2019-03-28 ENCOUNTER — Other Ambulatory Visit: Payer: Self-pay | Admitting: Unknown Physician Specialty

## 2019-03-28 NOTE — Telephone Encounter (Signed)
Requested medication (s) are due for refill today: Yes  Requested medication (s) are on the active medication list: Yes  Last refill:  02/23/18  Future visit scheduled: Yes  Notes to clinic: Unable to refill, expired    Requested Prescriptions  Pending Prescriptions Disp Refills   losartan (COZAAR) 25 MG tablet [Pharmacy Med Name: LOSARTAN 25MG  TABLETS] 30 tablet 11    Sig: TAKE 1 TABLET(25 MG) BY MOUTH DAILY     Cardiovascular:  Angiotensin Receptor Blockers Failed - 03/28/2019 10:14 AM      Failed - Cr in normal range and within 180 days    Creatinine  Date Value Ref Range Status  06/20/2012 0.75 0.60 - 1.30 mg/dL Final   Creatinine, Ser  Date Value Ref Range Status  08/25/2018 0.84 0.57 - 1.00 mg/dL Final         Failed - K in normal range and within 180 days    Potassium  Date Value Ref Range Status  08/25/2018 3.8 3.5 - 5.2 mmol/L Final  06/20/2012 3.4 (L) 3.5 - 5.1 mmol/L Final         Passed - Patient is not pregnant      Passed - Last BP in normal range    BP Readings from Last 1 Encounters:  03/02/19 108/86         Passed - Valid encounter within last 6 months    Recent Outpatient Visits          3 weeks ago Chronic atrial fibrillation   [facility] [place], [place] T, NP   7 months ago Need for influenza vaccination   [facility] [place], Dorie Rank, NP   9 months ago [employer], Lavella Hammock, PA-C   1 year ago Essential hypertension   [facility] Gabriel Cirri, NP   1 year ago Need for influenza vaccination   [facility] Gabriel Cirri, NP      Future Appointments            In 2 months [name], Dorie Rank, NP [employer], [facility]

## 2019-04-13 ENCOUNTER — Other Ambulatory Visit: Payer: Self-pay | Admitting: Nurse Practitioner

## 2019-05-20 ENCOUNTER — Other Ambulatory Visit: Payer: Self-pay | Admitting: Nurse Practitioner

## 2019-05-20 NOTE — Telephone Encounter (Signed)
Requested Prescriptions  Pending Prescriptions Disp Refills  . omeprazole (PRILOSEC) 20 MG capsule [Pharmacy Med Name: OMEPRAZOLE 20MG  CAPSULES] 90 capsule 0    Sig: TAKE 1 CAPSULE(20 MG) BY MOUTH DAILY     Gastroenterology: Proton Pump Inhibitors Passed - 05/20/2019  3:13 PM      Passed - Valid encounter within last 12 months    Recent Outpatient Visits          2 months ago Chronic atrial fibrillation   [place], Henrine Screws T, NP   8 months ago Need for influenza vaccination   [place], Barbaraann Faster, NP   11 months ago [employer], Wendee Beavers, PA-C   1 year ago Essential hypertension   [place] Kathrine Haddock, NP   1 year ago Need for influenza vaccination   [facility] Kathrine Haddock, NP      Future Appointments            In 2 weeks [name], Barbaraann Faster, NP [employer], [facility]           . traZODone (DESYREL) 50 MG tablet [Pharmacy Med Name: TRAZODONE 50MG  TABLETS] 90 tablet 0    Sig: TAKE 1 TABLET(50 MG) BY MOUTH AT BEDTIME     Psychiatry: Antidepressants - Serotonin Modulator Passed - 05/20/2019  3:13 PM      Passed - Valid encounter within last 6 months    Recent Outpatient Visits          2 months ago Chronic atrial fibrillation   [place], Henrine Screws T, NP   8 months ago Need for influenza vaccination   [place], Barbaraann Faster, NP   11 months ago [employer], Wendee Beavers, PA-C   1 year ago Essential hypertension   [place] Kathrine Haddock, NP   1 year ago Need for influenza vaccination   [facility] Kathrine Haddock, NP      Future Appointments            In 2 weeks [name], Barbaraann Faster, NP [employer], [facility]           Passed - Completed PHQ-2 or PHQ-9 in the last 360 days.      Marland Kitchen diltiazem (CARDIZEM CD) 180 MG 24 hr capsule [Pharmacy Med Name:  DILTIAZEM CD 180MG  CAPSULES] 90 capsule 0    Sig: TAKE 1 CAPSULE(180 MG) BY MOUTH DAILY     Cardiovascular:  Calcium Channel Blockers Passed - 05/20/2019  3:13 PM      Passed - Last BP in normal range    BP Readings from Last 1 Encounters:  03/02/19 108/86         Passed - Valid encounter within last 6 months    Recent Outpatient Visits          2 months ago Chronic atrial fibrillation   [facility] [place], [place] T, NP   8 months ago Need for influenza vaccination   [facility] Venita Lick, NP   11 months ago [employer], Wendee Beavers, PA-C   1 year ago Essential hypertension   [facility] Kathrine Haddock, NP   1 year ago Need for influenza vaccination   [facility] Kathrine Haddock, NP      Future Appointments            In  2 weeks Marjie Skiffannady, Jolene T, NP [facility]

## 2019-06-05 ENCOUNTER — Ambulatory Visit (INDEPENDENT_AMBULATORY_CARE_PROVIDER_SITE_OTHER): Payer: Medicare Other | Admitting: Nurse Practitioner

## 2019-06-05 ENCOUNTER — Encounter: Payer: Self-pay | Admitting: Nurse Practitioner

## 2019-06-05 ENCOUNTER — Other Ambulatory Visit: Payer: Self-pay

## 2019-06-05 ENCOUNTER — Other Ambulatory Visit: Payer: Self-pay | Admitting: Nurse Practitioner

## 2019-06-05 VITALS — BP 127/82 | HR 75 | Temp 98.5°F | Ht 61.0 in | Wt 167.0 lb

## 2019-06-05 DIAGNOSIS — I10 Essential (primary) hypertension: Secondary | ICD-10-CM | POA: Diagnosis not present

## 2019-06-05 DIAGNOSIS — F325 Major depressive disorder, single episode, in full remission: Secondary | ICD-10-CM | POA: Diagnosis not present

## 2019-06-05 DIAGNOSIS — R7301 Impaired fasting glucose: Secondary | ICD-10-CM

## 2019-06-05 DIAGNOSIS — I5022 Chronic systolic (congestive) heart failure: Secondary | ICD-10-CM | POA: Diagnosis not present

## 2019-06-05 DIAGNOSIS — I482 Chronic atrial fibrillation, unspecified: Secondary | ICD-10-CM | POA: Diagnosis not present

## 2019-06-05 DIAGNOSIS — E7800 Pure hypercholesterolemia, unspecified: Secondary | ICD-10-CM | POA: Diagnosis not present

## 2019-06-05 NOTE — Progress Notes (Signed)
BP 127/82    Pulse 75    Temp 98.5 F (36.9 C) (Oral)    Ht 5\' 1"  (1.549 m)    Wt 167 lb (75.8 kg)    LMP  (LMP Unknown)    SpO2 96%    BMI 31.55 kg/m    Subjective:    Patient ID: Melinda Preston, female    DOB: Mar 15, 1928, 85 y.o.   MRN: [id number]  HPI: Melinda Preston is a 85 y.o. female  Chief Complaint  Patient presents with   Follow-up   Hypertension   Hyperlipidemia   Diabetes   HYPERTENSION / HYPERLIPIDEMIA Followed by cardiology, Dr. Cassie FreerParachos.  Last seen 03/06/2019.  Cotninues on ASA, Lipitor, Losartan, Cardizem, Carvedilol, Lasix, And Eliquis.  HFrEF 25-30% NYHA Class II-II, ACC/AHA stage C.  To follow-up next in October. Satisfied with current treatment? yes Duration of hypertension: chronic BP monitoring frequency: daily BP range: 110-120/80's at home on average BP medication side effects: no Duration of hyperlipidemia: chronic Cholesterol medication side effects: no Cholesterol supplements: fish oil Medication compliance: good compliance Aspirin: yes Recent stressors: no Recurrent headaches: no Visual changes: no Palpitations: no Dyspnea: no Chest pain: no Lower extremity edema: no Dizzy/lightheaded: no   ATRIAL FIBRILLATION Atrial fibrillation status: stable Satisfied with current treatment: yes  Medication side effects:  no Medication compliance: good compliance Etiology of atrial fibrillation:  Palpitations:  no Chest pain:  no Dyspnea on exertion:  no Orthopnea:  no Syncope:  no Edema:  no Ventricular rate control: diltiazem Anti-coagulation: long acting   IFG: A1C at last visit 6.3% with advanced age goal <8%.  No current medications and she remains diet controlled.  Denies polyuria, polydipsia, and polyphagia.  She continues to focus on diet reporting she no longer drinks [facility] Dew and drinks water often.  Does endorse enjoying sweets, especially fruit.  We discussed at length diabetes and continuing to check labs.  At  this time she wishes to no longer check A1C.  Wishes to continue focus on diet and quality of life.  DEPRESSION Continues on Sertraline 25 MG and Trazodone 50 MG at HS (mood and sleep aide). Mood status: stable Satisfied with current treatment?: yes Symptom severity: mild  Duration of current treatment : chronic Side effects: no Medication compliance: good compliance Psychotherapy/counseling: none Depressed mood: yes Anxious mood: no Anhedonia: no Significant weight loss or gain: no Insomnia: yes hard to fall asleep Fatigue: no Feelings of worthlessness or guilt: no Impaired concentration/indecisiveness: no Suicidal ideations: no Hopelessness: no Crying spells: no Depression screen [facility] 2/9 06/05/2019 08/25/2018 02/21/2018 07/07/2017 03/22/2016  Decreased Interest 1 0 1 0 0  Down, Depressed, Hopeless 0 0 1 0 0  PHQ - 2 Score 1 0 2 0 0  Altered sleeping 0 0 0 - -  Tired, decreased energy 2 0 3 - -  Change in appetite 0 0 0 - -  Feeling bad or failure about yourself  0 0 0 - -  Trouble concentrating 0 0 0 - -  Moving slowly or fidgety/restless 0 0 0 - -  Suicidal thoughts 0 0 0 - -  PHQ-9 Score 3 0 5 - -  Difficult doing work/chores Not difficult at all - - - -    Relevant past medical, surgical, family and social history reviewed and updated as indicated. Interim medical history since our last visit reviewed. Allergies and medications reviewed and updated.  Review of Systems  Constitutional: Negative for activity change, appetite change, diaphoresis,  fatigue and fever.  Respiratory: Negative for cough, chest tightness and shortness of breath.   Cardiovascular: Negative for chest pain, palpitations and leg swelling.  Gastrointestinal: Negative for abdominal distention, abdominal pain, constipation, diarrhea, nausea and vomiting.  Endocrine: Negative for cold intolerance, heat intolerance, polydipsia, polyphagia and polyuria.  Neurological: Negative for dizziness, syncope,  weakness, light-headedness, numbness and headaches.  Psychiatric/Behavioral: Negative.     Per HPI unless specifically indicated above     Objective:    BP 127/82    Pulse 75    Temp 98.5 F (36.9 C) (Oral)    Ht 5\' 1"  (1.549 m)    Wt 167 lb (75.8 kg)    LMP  (LMP Unknown)    SpO2 96%    BMI 31.55 kg/m   Wt Readings from Last 3 Encounters:  06/05/19 167 lb (75.8 kg)  08/25/18 167 lb (75.8 kg)  06/21/18 169 lb (76.7 kg)    Physical Exam Vitals signs and nursing note reviewed.  Constitutional:      General: She is awake. She is not in acute distress.    Appearance: She is well-developed. She is not ill-appearing.  HENT:     Head: Normocephalic.     Right Ear: Hearing normal.     Left Ear: Hearing normal.     Nose: Nose normal.     Mouth/Throat:     Mouth: Mucous membranes are moist.  Eyes:     General: Lids are normal.        Right eye: No discharge.        Left eye: No discharge.     Conjunctiva/sclera: Conjunctivae normal.     Pupils: Pupils are equal, round, and reactive to light.  Neck:     Musculoskeletal: Normal range of motion and neck supple.     Thyroid: No thyromegaly.     Vascular: No carotid bruit or JVD.  Cardiovascular:     Rate and Rhythm: Normal rate and regular rhythm.     Heart sounds: Murmur present. Systolic murmur present with a grade of 2/6. No gallop.   Pulmonary:     Effort: Pulmonary effort is normal.     Breath sounds: Normal breath sounds.  Abdominal:     General: Bowel sounds are normal.     Palpations: Abdomen is soft. There is no hepatomegaly or splenomegaly.  Musculoskeletal:     Right lower leg: Edema (trace) present.     Left lower leg: Edema (trace) present.  Lymphadenopathy:     Cervical: No cervical adenopathy.  Skin:    General: Skin is warm and dry.  Neurological:     Mental Status: She is alert and oriented to person, place, and time.  Psychiatric:        Attention and Perception: Attention normal.        Mood and Affect:  Mood normal.        Behavior: Behavior normal. Behavior is cooperative.        Thought Content: Thought content normal.        Judgment: Judgment normal.     Results for orders placed or performed in visit on 08/29/18  HM DIABETES EYE EXAM  Result Value Ref Range   HM Diabetic Eye Exam Retinopathy (A) No Retinopathy      Assessment & Plan:   Problem List Items Addressed This Visit      Cardiovascular and Mediastinum   Essential hypertension    Chronic, stable with numbers at goal today and  at home.  Continue current medication regimen and collaboration with cardiology.        Chronic atrial fibrillation    Chronic, ongoing.  Continue current medication regimen and collaboration with cardiology.        Heart failure (HCC) - Primary    Chronic, ongoing.  Continue current medication regimen and collaboration with cardiology.        Endocrine   IFG (impaired fasting glucose)    Joint decision with patient to no longer check A1C, currently not taking medications for diabetes and she would prefer not to as is on 14 medications total at this time.  She wishes to continue to focus on healthy diet and quality of life.  This is appropriate due to advanced age, turns [age] in one month.        Other   Hypercholesterolemia    Chronic, ongoing.  Continue current medication regimen and adjust as needed.  Lipid panel and CMP today.        Relevant Orders   Comprehensive metabolic panel   Lipid Panel w/o Chol/HDL Ratio   Depression    Chronic, stable.  Denies SI/HI.  Continue current medication regimen.  Monitor NA+ level with Sertraline and advanced age.          Follow up plan: Return in about 3 months (around 09/05/2019) for HTN/HLD, Depression, A-fib.

## 2019-06-05 NOTE — Patient Instructions (Signed)
Carbohydrate Counting for Diabetes Mellitus, Adult  Carbohydrate counting is a method of keeping track of how many carbohydrates you eat. Eating carbohydrates naturally increases the amount of sugar (glucose) in the blood. Counting how many carbohydrates you eat helps keep your blood glucose within normal limits, which helps you manage your diabetes (diabetes mellitus). It is important to know how many carbohydrates you can safely have in each meal. This is different for every person. A diet and nutrition specialist (registered dietitian) can help you make a meal plan and calculate how many carbohydrates you should have at each meal and snack. Carbohydrates are found in the following foods:  Grains, such as breads and cereals.  Dried beans and soy products.  Starchy vegetables, such as potatoes, peas, and corn.  Fruit and fruit juices.  Milk and yogurt.  Sweets and snack foods, such as cake, cookies, candy, chips, and soft drinks. How do I count carbohydrates? There are two ways to count carbohydrates in food. You can use either of the methods or a combination of both. Reading "Nutrition Facts" on packaged food The "Nutrition Facts" list is included on the labels of almost all packaged foods and beverages in the [place] It includes:  The serving size.  Information about nutrients in each serving, including the grams (g) of carbohydrate per serving. To use the "Nutrition Facts":  Decide how many servings you will have.  Multiply the number of servings by the number of carbohydrates per serving.  The resulting number is the total amount of carbohydrates that you will be having. Learning standard serving sizes of other foods When you eat carbohydrate foods that are not packaged or do not include "Nutrition Facts" on the label, you need to measure the servings in order to count the amount of carbohydrates:  Measure the foods that you will eat with a food scale or measuring cup, if needed.   Decide how many standard-size servings you will eat.  Multiply the number of servings by 15. Most carbohydrate-rich foods have about 15 g of carbohydrates per serving. ? For example, if you eat 8 oz (170 g) of strawberries, you will have eaten 2 servings and 30 g of carbohydrates (2 servings x 15 g = 30 g).  For foods that have more than one food mixed, such as soups and casseroles, you must count the carbohydrates in each food that is included. The following list contains standard serving sizes of common carbohydrate-rich foods. Each of these servings has about 15 g of carbohydrates:   hamburger bun or  English muffin.   oz (15 mL) syrup.   oz (14 g) jelly.  1 slice of bread.  1 six-inch tortilla.  3 oz (85 g) cooked rice or pasta.  4 oz (113 g) cooked dried beans.  4 oz (113 g) starchy vegetable, such as peas, corn, or potatoes.  4 oz (113 g) hot cereal.  4 oz (113 g) mashed potatoes or  of a large baked potato.  4 oz (113 g) canned or frozen fruit.  4 oz (120 mL) fruit juice.  4-6 crackers.  6 chicken nuggets.  6 oz (170 g) unsweetened dry cereal.  6 oz (170 g) plain fat-free yogurt or yogurt sweetened with artificial sweeteners.  8 oz (240 mL) milk.  8 oz (170 g) fresh fruit or one small piece of fruit.  24 oz (680 g) popped popcorn. Example of carbohydrate counting Sample meal  3 oz (85 g) chicken breast.  6 oz (170 g)   brown rice.  4 oz (113 g) corn.  8 oz (240 mL) milk.  8 oz (170 g) strawberries with sugar-free whipped topping. Carbohydrate calculation 1. Identify the foods that contain carbohydrates: ? Rice. ? Corn. ? Milk. ? Strawberries. 2. Calculate how many servings you have of each food: ? 2 servings rice. ? 1 serving corn. ? 1 serving milk. ? 1 serving strawberries. 3. Multiply each number of servings by 15 g: ? 2 servings rice x 15 g = 30 g. ? 1 serving corn x 15 g = 15 g. ? 1 serving milk x 15 g = 15 g. ? 1 serving  strawberries x 15 g = 15 g. 4. Add together all of the amounts to find the total grams of carbohydrates eaten: ? 30 g + 15 g + 15 g + 15 g = 75 g of carbohydrates total. Summary  Carbohydrate counting is a method of keeping track of how many carbohydrates you eat.  Eating carbohydrates naturally increases the amount of sugar (glucose) in the blood.  Counting how many carbohydrates you eat helps keep your blood glucose within normal limits, which helps you manage your diabetes.  A diet and nutrition specialist (registered dietitian) can help you make a meal plan and calculate how many carbohydrates you should have at each meal and snack. This information is not intended to replace advice given to you by your health care provider. Make sure you discuss any questions you have with your health care provider. Document Released: 11/01/2005 Document Revised: 05/26/2017 Document Reviewed: 04/14/2016 Elsevier Patient Education  2020 Else[employer] Inc.  

## 2019-06-05 NOTE — Assessment & Plan Note (Signed)
Chronic, stable with numbers at goal today and at home.  Continue current medication regimen and collaboration with cardiology.

## 2019-06-05 NOTE — Assessment & Plan Note (Signed)
Chronic, ongoing.  Continue current medication regimen and collaboration with cardiology. 

## 2019-06-05 NOTE — Assessment & Plan Note (Signed)
Chronic, ongoing.  Continue current medication regimen and adjust as needed.  Lipid panel and CMP today.    

## 2019-06-05 NOTE — Assessment & Plan Note (Signed)
Chronic, stable.  Denies SI/HI.  Continue current medication regimen.  Monitor NA+ level with Sertraline and advanced age.

## 2019-06-05 NOTE — Assessment & Plan Note (Signed)
Joint decision with patient to no longer check A1C, currently not taking medications for diabetes and she would prefer not to as is on 14 medications total at this time.  She wishes to continue to focus on healthy diet and quality of life.  This is appropriate due to advanced age, turns 89 in one month.

## 2019-06-06 LAB — COMPREHENSIVE METABOLIC PANEL
ALT: 8 IU/L (ref 0–32)
AST: 15 IU/L (ref 0–40)
Albumin/Globulin Ratio: 2 (ref 1.2–2.2)
Albumin: 4.5 g/dL (ref 3.6–4.6)
Alkaline Phosphatase: 74 IU/L (ref 39–117)
BUN/Creatinine Ratio: 18 (ref 12–28)
BUN: 16 mg/dL (ref 8–27)
Bilirubin Total: 0.7 mg/dL (ref 0.0–1.2)
CO2: 26 mmol/L (ref 20–29)
Calcium: 9.4 mg/dL (ref 8.7–10.3)
Chloride: 97 mmol/L (ref 96–106)
Creatinine, Ser: 0.87 mg/dL (ref 0.57–1.00)
GFR calc Af Amer: 69 mL/min/{1.73_m2} (ref >59)
GFR calc non Af Amer: 60 mL/min/{1.73_m2} (ref >59)
Globulin, Total: 2.3 g/dL (ref 1.5–4.5)
Glucose: 121 mg/dL — ABNORMAL HIGH (ref 65–99)
Potassium: 4 mmol/L (ref 3.5–5.2)
Sodium: 143 mmol/L (ref 134–144)
Total Protein: 6.8 g/dL (ref 6.0–8.5)

## 2019-06-06 LAB — LIPID PANEL W/O CHOL/HDL RATIO
Cholesterol, Total: 155 mg/dL (ref 100–199)
HDL: 54 mg/dL (ref >39)
LDL Calculated: 77 mg/dL (ref 0–99)
Triglycerides: 121 mg/dL (ref 0–149)
VLDL Cholesterol Cal: 24 mg/dL (ref 5–40)

## 2019-06-06 NOTE — Progress Notes (Signed)
Normal test results noted.  Please call patient and make them aware of normal results and will continue to monitor at regular visits.  Have a great day.  Look forward to seeing you at your next visit.

## 2019-07-10 ENCOUNTER — Other Ambulatory Visit: Payer: Self-pay | Admitting: Nurse Practitioner

## 2019-07-16 DIAGNOSIS — E113293 Type 2 diabetes mellitus with mild nonproliferative diabetic retinopathy without macular edema, bilateral: Secondary | ICD-10-CM | POA: Diagnosis not present

## 2019-07-16 LAB — HM DIABETES EYE EXAM

## 2019-08-20 ENCOUNTER — Other Ambulatory Visit: Payer: Self-pay

## 2019-08-20 MED ORDER — DILTIAZEM HCL ER COATED BEADS 180 MG PO CP24: ORAL_CAPSULE | ORAL | 0 refills | Status: DC

## 2019-08-20 MED ORDER — TRAZODONE HCL 50 MG PO TABS: ORAL_TABLET | ORAL | 0 refills | Status: DC

## 2019-08-20 MED ORDER — OMEPRAZOLE 20 MG PO CPDR: DELAYED_RELEASE_CAPSULE | ORAL | 0 refills | Status: DC

## 2019-08-20 NOTE — Telephone Encounter (Signed)
Fax from [facility] requesting Rx refill on omeprazole 20 mg capsules

## 2019-08-23 ENCOUNTER — Other Ambulatory Visit: Payer: Self-pay | Admitting: Nurse Practitioner

## 2019-08-23 DIAGNOSIS — Z1231 Encounter for screening mammogram for malignant neoplasm of breast: Secondary | ICD-10-CM

## 2019-09-05 ENCOUNTER — Other Ambulatory Visit: Payer: Self-pay

## 2019-09-05 MED ORDER — APIXABAN 2.5 MG PO TABS: 2.5000 mg | ORAL_TABLET | Freq: Two times a day (BID) | ORAL | 2 refills | Status: DC

## 2019-09-05 NOTE — Telephone Encounter (Signed)
[facility] faxed Rx refill request on Eliquis 205 mg tablet

## 2019-09-06 DIAGNOSIS — I420 Dilated cardiomyopathy: Secondary | ICD-10-CM | POA: Diagnosis not present

## 2019-09-06 DIAGNOSIS — I10 Essential (primary) hypertension: Secondary | ICD-10-CM | POA: Diagnosis not present

## 2019-09-06 DIAGNOSIS — I340 Nonrheumatic mitral (valve) insufficiency: Secondary | ICD-10-CM | POA: Diagnosis not present

## 2019-09-06 DIAGNOSIS — E7800 Pure hypercholesterolemia, unspecified: Secondary | ICD-10-CM | POA: Diagnosis not present

## 2019-09-06 DIAGNOSIS — I4819 Other persistent atrial fibrillation: Secondary | ICD-10-CM | POA: Diagnosis not present

## 2019-09-11 ENCOUNTER — Encounter: Payer: Self-pay | Admitting: Nurse Practitioner

## 2019-09-11 ENCOUNTER — Ambulatory Visit (INDEPENDENT_AMBULATORY_CARE_PROVIDER_SITE_OTHER): Payer: Medicare Other | Admitting: Nurse Practitioner

## 2019-09-11 ENCOUNTER — Other Ambulatory Visit: Payer: Self-pay

## 2019-09-11 VITALS — BP 137/86 | HR 77 | Temp 98.8°F

## 2019-09-11 DIAGNOSIS — F325 Major depressive disorder, single episode, in full remission: Secondary | ICD-10-CM

## 2019-09-11 DIAGNOSIS — I10 Essential (primary) hypertension: Secondary | ICD-10-CM | POA: Diagnosis not present

## 2019-09-11 DIAGNOSIS — Z23 Encounter for immunization: Secondary | ICD-10-CM | POA: Diagnosis not present

## 2019-09-11 DIAGNOSIS — I5022 Chronic systolic (congestive) heart failure: Secondary | ICD-10-CM | POA: Diagnosis not present

## 2019-09-11 DIAGNOSIS — E7800 Pure hypercholesterolemia, unspecified: Secondary | ICD-10-CM

## 2019-09-11 DIAGNOSIS — F5101 Primary insomnia: Secondary | ICD-10-CM

## 2019-09-11 DIAGNOSIS — I482 Chronic atrial fibrillation, unspecified: Secondary | ICD-10-CM | POA: Diagnosis not present

## 2019-09-11 DIAGNOSIS — K219 Gastro-esophageal reflux disease without esophagitis: Secondary | ICD-10-CM

## 2019-09-11 NOTE — Assessment & Plan Note (Signed)
Chronic, ongoing.  Continue current medication regimen and collaboration with cardiology.   - Reminded to call for an overnight weight gain of >2 pounds or a weekly weight weight of >5 pounds - not adding salt to his food and has been reading food labels. Reviewed the importance of keeping daily sodium intake to <2000mg daily  

## 2019-09-11 NOTE — Assessment & Plan Note (Signed)
Chronic, stable with Trazodone.  Continue current regimen and adjust as needed.

## 2019-09-11 NOTE — Patient Instructions (Signed)

## 2019-09-11 NOTE — Assessment & Plan Note (Signed)
Chronic, stable.  Denies SI/HI.  Continue current medication regimen.  Monitor NA+ level with Sertraline and advanced age.

## 2019-09-11 NOTE — Assessment & Plan Note (Signed)
Chronic, ongoing.  Continue current medication regimen and collaboration with cardiology. 

## 2019-09-11 NOTE — Assessment & Plan Note (Signed)
Chronic, stable with numbers at goal today for her age and at home.  Continue current medication regimen and collaboration with cardiology.  Monitor for hypotension and falls, reduce medication as needed.

## 2019-09-11 NOTE — Assessment & Plan Note (Addendum)
Chronic, stable with current regimen.  Continue this regimen and adjust as needed.  Would benefit from attempt at reduction in new year and change to Pepcid.  Mag level next visit.

## 2019-09-11 NOTE — Progress Notes (Signed)
BP 137/86   Pulse 77   Temp 98.8 F (37.1 C) (Oral)   LMP  (LMP Unknown)   SpO2 97%    Subjective:    Patient ID: Melinda Preston, female    DOB: Jun 20, 1928, 85 y.o.   MRN: [id number]  HPI: Melinda Preston is a 85 y.o. female  Chief Complaint  Patient presents with  . Depression  . Hyperlipidemia  . Hypertension   HYPERTENSION / HYPERLIPIDEMIA/HF Followed by cardiology, Dr. Cassie Freer. Last seen 09/06/19.  Cotninues on ASA, Lipitor, Losartan, Cardizem, Carvedilol, Lasix, And Eliquis.  HFrEF 25-30% NYHA Class II-II, ACC/AHA stage C.  On review of note plan is to repeat echo next visit, in 4 months. Satisfied with current treatment? yes Duration of hypertension: chronic BP monitoring frequency: daily BP range: 110-120/80's at home on average BP medication side effects: no Duration of hyperlipidemia: chronic Cholesterol medication side effects: no Cholesterol supplements: fish oil Medication compliance: good compliance Aspirin: yes Recent stressors: no Recurrent headaches: no Visual changes: no Palpitations: no Dyspnea: at baseline, no increase Chest pain: no Lower extremity edema: no Dizzy/lightheaded: no   ATRIAL FIBRILLATION Takes Eliquis daily. Atrial fibrillation status: stable Satisfied with current treatment: yes  Medication side effects:  no Medication compliance: good compliance Etiology of atrial fibrillation:  Palpitations:  no Chest pain:  no Dyspnea on exertion:  no Orthopnea:  no Syncope:  no Edema:  no Ventricular rate control: diltiazem Anti-coagulation: long acting   GERD Continues on Omeprazole and reports no heart burn. GERD control status: controlled  Satisfied with current treatment? yes Heartburn frequency: none Medication side effects: no  Medication compliance: stable Dysphagia: no Odynophagia:  no Hematemesis: no Blood in stool: no EGD: no  DEPRESSION Continues on Sertraline 25 MG and Trazodone 50 MG at HS  (mood and sleep aide). Mood status: stable Satisfied with current treatment?: yes Symptom severity: mild  Duration of current treatment : chronic Side effects: no Medication compliance: good compliance Psychotherapy/counseling: none Depressed mood: yes Anxious mood: no Anhedonia: no Significant weight loss or gain: no Insomnia: yes hard to fall asleep Fatigue: no Feelings of worthlessness or guilt: no Impaired concentration/indecisiveness: no Suicidal ideations: no Hopelessness: no Crying spells: no Depression screen [facility] 2/9 09/11/2019 06/05/2019 08/25/2018 02/21/2018 07/07/2017  Decreased Interest 0 1 0 1 0  Down, Depressed, Hopeless 0 0 0 1 0  PHQ - 2 Score 0 1 0 2 0  Altered sleeping 0 0 0 0 -  Tired, decreased energy 1 2 0 3 -  Change in appetite 0 0 0 0 -  Feeling bad or failure about yourself  0 0 0 0 -  Trouble concentrating 0 0 0 0 -  Moving slowly or fidgety/restless 0 0 0 0 -  Suicidal thoughts 0 0 0 0 -  PHQ-9 Score 1 3 0 5 -  Difficult doing work/chores Not difficult at all Not difficult at all - - -    Relevant past medical, surgical, family and social history reviewed and updated as indicated. Interim medical history since our last visit reviewed. Allergies and medications reviewed and updated.  Review of Systems  Constitutional: Negative for activity change, appetite change, diaphoresis, fatigue and fever.  Respiratory: Negative for cough, chest tightness and shortness of breath.   Cardiovascular: Negative for chest pain, palpitations and leg swelling.  Gastrointestinal: Negative for abdominal distention, abdominal pain, constipation, diarrhea, nausea and vomiting.  Endocrine: Negative for cold intolerance, heat intolerance, polydipsia, polyphagia and polyuria.  Neurological: Negative  for dizziness, syncope, weakness, light-headedness, numbness and headaches.  Psychiatric/Behavioral: Negative.     Per HPI unless specifically indicated above     Objective:     BP 137/86   Pulse 77   Temp 98.8 F (37.1 C) (Oral)   LMP  (LMP Unknown)   SpO2 97%   Wt Readings from Last 3 Encounters:  06/05/19 167 lb (75.8 kg)  08/25/18 167 lb (75.8 kg)  06/21/18 169 lb (76.7 kg)    Physical Exam Vitals signs and nursing note reviewed.  Constitutional:      General: She is awake. She is not in acute distress.    Appearance: She is well-developed. She is not ill-appearing.  HENT:     Head: Normocephalic.     Right Ear: Hearing normal.     Left Ear: Hearing normal.     Nose: Nose normal.     Mouth/Throat:     Mouth: Mucous membranes are moist.  Eyes:     General: Lids are normal.        Right eye: No discharge.        Left eye: No discharge.     Conjunctiva/sclera: Conjunctivae normal.     Pupils: Pupils are equal, round, and reactive to light.  Neck:     Musculoskeletal: Normal range of motion and neck supple.     Thyroid: No thyromegaly.     Vascular: No carotid bruit or JVD.  Cardiovascular:     Rate and Rhythm: Normal rate and regular rhythm.     Heart sounds: Murmur present. Systolic murmur present with a grade of 2/6. No gallop.   Pulmonary:     Effort: Pulmonary effort is normal.     Breath sounds: Normal breath sounds.  Abdominal:     General: Bowel sounds are normal.     Palpations: Abdomen is soft. There is no hepatomegaly or splenomegaly.  Musculoskeletal:     Right lower leg: Edema (trace) present.     Left lower leg: Edema (trace) present.  Lymphadenopathy:     Cervical: No cervical adenopathy.  Skin:    General: Skin is warm and dry.  Neurological:     Mental Status: She is alert and oriented to person, place, and time.  Psychiatric:        Attention and Perception: Attention normal.        Mood and Affect: Mood normal.        Behavior: Behavior normal. Behavior is cooperative.        Thought Content: Thought content normal.        Judgment: Judgment normal.     Results for orders placed or performed in visit on  07/18/19  HM DIABETES EYE EXAM  Result Value Ref Range   HM Diabetic Eye Exam Retinopathy (A) No Retinopathy      Assessment & Plan:   Problem List Items Addressed This Visit      Cardiovascular and Mediastinum   Essential hypertension    Chronic, stable with numbers at goal today for her age and at home.  Continue current medication regimen and collaboration with cardiology.  Monitor for hypotension and falls, reduce medication as needed.      Chronic atrial fibrillation (HCC) - Primary    Chronic, ongoing.  Continue current medication regimen and collaboration with cardiology.        Heart failure (HCC)    Chronic, ongoing.  Continue current medication regimen and collaboration with cardiology.  - Reminded to call for  an overnight weight gain of >2 pounds or a weekly weight weight of >5 pounds - not adding salt to his food and has been reading food labels. Reviewed the importance of keeping daily sodium intake to 2000mg  daily         Digestive   GERD without esophagitis    Chronic, stable with current regimen.  Continue this regimen and adjust as needed.  Would benefit from attempt at reduction in new year and change to Pepcid.  Mag level next visit.        Other   Hypercholesterolemia    Chronic, ongoing.  Continue current medication regimen and adjust as needed.  Lipid panel and CMP next visit.      Depression    Chronic, stable.  Denies SI/HI.  Continue current medication regimen.  Monitor NA+ level with Sertraline and advanced age.      Insomnia    Chronic, stable with Trazodone.  Continue current regimen and adjust as needed.       Other Visit Diagnoses    Need for influenza vaccination       Relevant Orders   Flu Vaccine QUAD High Dose(Fluad) (Completed)       Follow up plan: Return in about 6 months (around 03/11/2020) for HTN/HLD, GERD, A FIB.

## 2019-09-11 NOTE — Assessment & Plan Note (Signed)
Chronic, ongoing.  Continue current medication regimen and adjust as needed.  Lipid panel and CMP next visit. 

## 2019-09-17 ENCOUNTER — Other Ambulatory Visit: Payer: Self-pay

## 2019-09-17 ENCOUNTER — Ambulatory Visit
Admission: RE | Admit: 2019-09-17 | Discharge: 2019-09-17 | Disposition: A | Discharge status: Home / Self Care | Payer: Medicare Other | Source: Ambulatory Visit | Attending: Nurse Practitioner | Admitting: Nurse Practitioner

## 2019-09-17 DIAGNOSIS — Z1231 Encounter for screening mammogram for malignant neoplasm of breast: Secondary | ICD-10-CM | POA: Insufficient documentation

## 2019-09-24 ENCOUNTER — Other Ambulatory Visit: Payer: Self-pay

## 2019-09-24 DIAGNOSIS — M25473 Effusion, unspecified ankle: Secondary | ICD-10-CM

## 2019-09-24 MED ORDER — POTASSIUM CHLORIDE CRYS ER 20 MEQ PO TBCR: EXTENDED_RELEASE_TABLET | ORAL | 3 refills | Status: DC

## 2019-10-10 ENCOUNTER — Other Ambulatory Visit: Payer: Self-pay

## 2019-10-10 MED ORDER — SERTRALINE HCL 25 MG PO TABS: ORAL_TABLET | ORAL | 3 refills | Status: DC

## 2019-10-10 NOTE — Telephone Encounter (Signed)
Patient last seen 09/11/19

## 2019-11-12 ENCOUNTER — Telehealth: Payer: Self-pay | Admitting: Nurse Practitioner

## 2019-11-12 NOTE — Chronic Care Management (AMB) (Signed)
  Chronic Care Management   Note  11/12/2019 Name: Melinda Preston MRN: [id number] DOB: May 08, 1928  Melinda Preston is a 85 y.o. year old female who is a primary care patient of [name], Barbaraann Faster, NP. I reached out to Teena Dunk by phone today in response to a referral sent by Melinda Preston's health plan.     Melinda Preston was given information about Chronic Care Management services today including:  1. CCM service includes personalized support from designated clinical staff supervised by her physician, including individualized plan of care and coordination with other care providers 2. 24/7 contact phone numbers for assistance for urgent and routine care needs. 3. Service will only be billed when office clinical staff spend 20 minutes or more in a month to coordinate care. 4. Only one practitioner may furnish and bill the service in a calendar month. 5. The patient may stop CCM services at any time (effective at the end of the month) by phone call to the office staff. 6. The patient will be responsible for cost sharing (co-pay) of up to 20% of the service fee (after annual deductible is met).  Patient did not agree to enrollment in care management services and does not wish to consider at this time.  Follow up plan: The patient has been provided with contact information for the chronic care management team and has been advised to call with any health related questions or concerns.   [place], [place] [address] Direct Dial: [email or web address]  Website: [email or web address]

## 2019-11-17 ENCOUNTER — Other Ambulatory Visit: Payer: Self-pay | Admitting: Nurse Practitioner

## 2019-12-03 ENCOUNTER — Other Ambulatory Visit: Payer: Self-pay

## 2019-12-03 MED ORDER — ATORVASTATIN CALCIUM 20 MG PO TABS: 20.0000 mg | ORAL_TABLET | Freq: Every day | ORAL | 3 refills | Status: DC

## 2019-12-03 NOTE — Telephone Encounter (Signed)
Refill request for Atorvastatin. Upcoming OV 03/11/2020

## 2019-12-28 DIAGNOSIS — M25562 Pain in left knee: Secondary | ICD-10-CM | POA: Diagnosis not present

## 2019-12-28 DIAGNOSIS — M170 Bilateral primary osteoarthritis of knee: Secondary | ICD-10-CM | POA: Diagnosis not present

## 2019-12-28 DIAGNOSIS — G8929 Other chronic pain: Secondary | ICD-10-CM | POA: Diagnosis not present

## 2019-12-28 DIAGNOSIS — M25561 Pain in right knee: Secondary | ICD-10-CM | POA: Diagnosis not present

## 2020-01-08 ENCOUNTER — Other Ambulatory Visit: Payer: Self-pay | Admitting: Nurse Practitioner

## 2020-01-08 NOTE — Telephone Encounter (Signed)
Requested Prescriptions  Pending Prescriptions Disp Refills  . sertraline (ZOLOFT) 25 MG tablet [Pharmacy Med Name: SERTRALINE 25MG  TABLETS] 90 tablet 3    Sig: TAKE 1 TABLET(25 MG) BY MOUTH DAILY     Psychiatry:  Antidepressants - SSRI Passed - 01/08/2020  3:29 AM      Passed - Completed PHQ-2 or PHQ-9 in the last 360 days.      Passed - Valid encounter within last 6 months    Recent Outpatient Visits          3 months ago Chronic atrial fibrillation (HCC)   [facility] [place], [place] T, NP   7 months ago Chronic systolic heart failure (HCC)   [facility] [place], [place] T, NP   10 months ago Chronic atrial fibrillation   [facility] [place], [place] T, NP   1 year ago Need for influenza vaccination   [facility] [place], NP   1 year ago Marjie Skiff, [employer], PA-C      Future Appointments            In 2 months [name], Lavella Hammock, NP [place] [place], [facility]

## 2020-01-16 DIAGNOSIS — M170 Bilateral primary osteoarthritis of knee: Secondary | ICD-10-CM | POA: Diagnosis not present

## 2020-01-23 DIAGNOSIS — M170 Bilateral primary osteoarthritis of knee: Secondary | ICD-10-CM | POA: Diagnosis not present

## 2020-01-28 ENCOUNTER — Ambulatory Visit: Payer: Medicare Other

## 2020-01-30 DIAGNOSIS — M170 Bilateral primary osteoarthritis of knee: Secondary | ICD-10-CM | POA: Diagnosis not present

## 2020-02-17 ENCOUNTER — Other Ambulatory Visit: Payer: Self-pay | Admitting: Nurse Practitioner

## 2020-02-17 NOTE — Telephone Encounter (Signed)
Requested Prescriptions  Pending Prescriptions Disp Refills  . diltiazem (CARDIZEM CD) 180 MG 24 hr capsule [Pharmacy Med Name: DILTIAZEM CD 180MG  CAPSULES (24 HR)] 90 capsule 0    Sig: TAKE 1 CAPSULE(180 MG) BY MOUTH DAILY     Cardiovascular:  Calcium Channel Blockers Passed - 02/17/2020  3:29 AM      Passed - Last BP in normal range    BP Readings from Last 1 Encounters:  09/11/19 137/86         Passed - Valid encounter within last 6 months    Recent Outpatient Visits          5 months ago Chronic atrial fibrillation ([place])   [place], Jolene T, NP   8 months ago Chronic systolic heart failure ([place])   [place], Jolene T, NP   11 months ago Chronic atrial fibrillation   [place], [place] T, NP   1 year ago Need for influenza vaccination   [place], Barbaraann Faster, NP   1 year ago [employer], Wendee Beavers, PA-C      Future Appointments            Tomorrow [name], Barbaraann Faster, NP [place], [place], [facility]           . traZODone (DESYREL) 50 MG tablet [Pharmacy Med Name: TRAZODONE 50MG  TABLETS] 90 tablet 0    Sig: TAKE 1 TABLET(50 MG) BY MOUTH AT BEDTIME     Psychiatry: Antidepressants - Serotonin Modulator Passed - 02/17/2020  3:29 AM      Passed - Completed PHQ-2 or PHQ-9 in the last 360 days.      Passed - Valid encounter within last 6 months    Recent Outpatient Visits          5 months ago Chronic atrial fibrillation ([place])   [place], [place] T, NP   8 months ago Chronic systolic heart failure ([place])   [place], [place] T, NP   11 months ago Chronic atrial fibrillation   [place], [place] T, NP   1 year ago Need for influenza vaccination   [place], Barbaraann Faster, NP   1 year ago [employer],  Wendee Beavers, PA-C      Future Appointments            Tomorrow Venita Lick, NP [facility], [place], [facility]           . omeprazole (PRILOSEC) 20 MG capsule [Pharmacy Med Name: OMEPRAZOLE 20MG  CAPSULES] 90 capsule 1    Sig: TAKE 1 CAPSULE(20 MG) BY MOUTH DAILY     Gastroenterology: Proton Pump Inhibitors Passed - 02/17/2020  3:29 AM      Passed - Valid encounter within last 12 months    Recent Outpatient Visits          5 months ago Chronic atrial fibrillation ([place])   [place], Jolene T, NP   8 months ago Chronic systolic heart failure ([place])   [place], Jolene T, NP   11 months ago Chronic atrial fibrillation   [place], [place] T, NP   1 year ago Need for influenza vaccination   [place], Henrine [place] T, NP   1 year ago  [place]   [facility] [place], Lavella Hammock, PA-C      Future Appointments            Tomorrow Marjie Skiff, NP [facility], [facility]   Tomorrow  [facility], [facility]

## 2020-02-18 ENCOUNTER — Ambulatory Visit (INDEPENDENT_AMBULATORY_CARE_PROVIDER_SITE_OTHER): Payer: Medicare Other | Admitting: Unknown Physician Specialty

## 2020-02-18 ENCOUNTER — Ambulatory Visit: Payer: Medicare Other | Admitting: Nurse Practitioner

## 2020-02-18 ENCOUNTER — Other Ambulatory Visit: Payer: Self-pay

## 2020-02-18 ENCOUNTER — Ambulatory Visit (INDEPENDENT_AMBULATORY_CARE_PROVIDER_SITE_OTHER): Payer: Medicare Other

## 2020-02-18 ENCOUNTER — Encounter: Payer: Self-pay | Admitting: Unknown Physician Specialty

## 2020-02-18 VITALS — BP 141/85 | HR 86 | Temp 98.5°F | Wt 165.2 lb

## 2020-02-18 DIAGNOSIS — R42 Dizziness and giddiness: Secondary | ICD-10-CM | POA: Diagnosis not present

## 2020-02-18 DIAGNOSIS — I5022 Chronic systolic (congestive) heart failure: Secondary | ICD-10-CM | POA: Diagnosis not present

## 2020-02-18 DIAGNOSIS — I10 Essential (primary) hypertension: Secondary | ICD-10-CM | POA: Diagnosis not present

## 2020-02-18 DIAGNOSIS — Z9181 History of falling: Secondary | ICD-10-CM

## 2020-02-18 DIAGNOSIS — Z0000 Encounter for general adult medical examination without abnormal findings: Secondary | ICD-10-CM | POA: Diagnosis not present

## 2020-02-18 DIAGNOSIS — R5383 Other fatigue: Secondary | ICD-10-CM | POA: Diagnosis not present

## 2020-02-18 NOTE — Assessment & Plan Note (Signed)
F/u with Dr. Cassie Freer.  Concern contributing to dizzyness

## 2020-02-18 NOTE — Patient Instructions (Signed)
Melinda Preston , Thank you for taking time to come for your Medicare Wellness Visit. I appreciate your ongoing commitment to your health goals. Please review the following plan we discussed and let me know if I can assist you in the future.   Screening recommendations/referrals: Colonoscopy: no longer required Mammogram: no longer required  Bone Density: completed  Recommended yearly ophthalmology/optometry visit for glaucoma screening and checkup Recommended yearly dental visit for hygiene and checkup  Vaccinations: Influenza vaccine: up to date Pneumococcal vaccine: up to date Tdap vaccine: up to date Shingles vaccine: shingrix eligible   Covid-19:We are recommending the vaccine to everyone who has not had an allergic reaction to any of the components of the vaccine. If you have specific questions about the vaccine, please bring them up with your health care provider to discuss them.   We will likely not be getting the vaccine in the office for the first rounds of vaccinations. The way they are releasing the vaccines is going to be through the health systems (like [place], [facility], [facility], [facility]), through your county health department, or through the pharmacies.   The [facility] Department is giving vaccines to those 65+ and Health Care Workers Teachers and [place] providers start 01/09/20, Essential workers start 3/10 and those with co-morbidities start 02/06/20 Call [phone] to schedule  If you are 65+ you can get a vaccine through [facility] by signing up for an appointment.  You can sign up by going to: [email or web address].  You can get more information by going to: [email or web address]  [employer] next door is giving the [employer]- you can call [phone] or stop by there to schedule.  Advanced directives: Please bring a copy of your health care power of attorney and living will to the office at your convenience.  Conditions/risks  identified: none  Next appointment: Follow up in one year for your annual wellness visit   Preventive Care 65 Years and Older, Female Preventive care refers to lifestyle choices and visits with your health care provider that can promote health and wellness. What does preventive care include?  A yearly physical exam. This is also called an annual well check.  Dental exams once or twice a year.  Routine eye exams. Ask your health care provider how often you should have your eyes checked.  Personal lifestyle choices, including:  Daily care of your teeth and gums.  Regular physical activity.  Eating a healthy diet.  Avoiding tobacco and drug use.  Limiting alcohol use.  Practicing safe sex.  Taking low-dose aspirin every day.  Taking vitamin and mineral supplements as recommended by your health care provider. What happens during an annual well check? The services and screenings done by your health care provider during your annual well check will depend on your age, overall health, lifestyle risk factors, and family history of disease. Counseling  Your health care provider may ask you questions about your:  Alcohol use.  Tobacco use.  Drug use.  Emotional well-being.  Home and relationship well-being.  Sexual activity.  Eating habits.  History of falls.  Memory and ability to understand (cognition).  Work and work Statistician.  Reproductive health. Screening  You may have the following tests or measurements:  Height, weight, and BMI.  Blood pressure.  Lipid and cholesterol levels. These may be checked every 5 years, or more frequently if you are over 67 years old.  Skin check.  Lung cancer screening. You may have this screening every year starting  at age 66 if you have a 30-pack-year history of smoking and currently smoke or have quit within the past 15 years.  Fecal occult blood test (FOBT) of the stool. You may have this test every year starting at  age 12.  Flexible sigmoidoscopy or colonoscopy. You may have a sigmoidoscopy every 5 years or a colonoscopy every 10 years starting at age 32.  Hepatitis C blood test.  Hepatitis B blood test.  Sexually transmitted disease (STD) testing.  Diabetes screening. This is done by checking your blood sugar (glucose) after you have not eaten for a while (fasting). You may have this done every 1-3 years.  Bone density scan. This is done to screen for osteoporosis. You may have this done starting at age 81.  Mammogram. This may be done every 1-2 years. Talk to your health care provider about how often you should have regular mammograms. Talk with your health care provider about your test results, treatment options, and if necessary, the need for more tests. Vaccines  Your health care provider may recommend certain vaccines, such as:  Influenza vaccine. This is recommended every year.  Tetanus, diphtheria, and acellular pertussis (Tdap, Td) vaccine. You may need a Td booster every 10 years.  Zoster vaccine. You may need this after age 67.  Pneumococcal 13-valent conjugate (PCV13) vaccine. One dose is recommended after age 31.  Pneumococcal polysaccharide (PPSV23) vaccine. One dose is recommended after age 12. Talk to your health care provider about which screenings and vaccines you need and how often you need them. This information is not intended to replace advice given to you by your health care provider. Make sure you discuss any questions you have with your health care provider. Document Released: 11/28/2015 Document Revised: 07/21/2016 Document Reviewed: 09/02/2015 Elsevier Interactive Patient Education  2017 [employer].  Fall Prevention in the Home Falls can cause injuries. They can happen to people of all ages. There are many things you can do to make your home safe and to help prevent falls. What can I do on the outside of my home?  Regularly fix the edges of walkways and  driveways and fix any cracks.  Remove anything that might make you trip as you walk through a door, such as a raised step or threshold.  Trim any bushes or trees on the path to your home.  Use bright outdoor lighting.  Clear any walking paths of anything that might make someone trip, such as rocks or tools.  Regularly check to see if handrails are loose or broken. Make sure that both sides of any steps have handrails.  Any raised decks and porches should have guardrails on the edges.  Have any leaves, snow, or ice cleared regularly.  Use sand or salt on walking paths during winter.  Clean up any spills in your garage right away. This includes oil or grease spills. What can I do in the bathroom?  Use night lights.  Install grab bars by the toilet and in the tub and shower. Do not use towel bars as grab bars.  Use non-skid mats or decals in the tub or shower.  If you need to sit down in the shower, use a plastic, non-slip stool.  Keep the floor dry. Clean up any water that spills on the floor as soon as it happens.  Remove soap buildup in the tub or shower regularly.  Attach bath mats securely with double-sided non-slip rug tape.  Do not have throw rugs and other things  on the floor that can make you trip. What can I do in the bedroom?  Use night lights.  Make sure that you have a light by your bed that is easy to reach.  Do not use any sheets or blankets that are too big for your bed. They should not hang down onto the floor.  Have a firm chair that has side arms. You can use this for support while you get dressed.  Do not have throw rugs and other things on the floor that can make you trip. What can I do in the kitchen?  Clean up any spills right away.  Avoid walking on wet floors.  Keep items that you use a lot in easy-to-reach places.  If you need to reach something above you, use a strong step stool that has a grab bar.  Keep electrical cords out of the  way.  Do not use floor polish or wax that makes floors slippery. If you must use wax, use non-skid floor wax.  Do not have throw rugs and other things on the floor that can make you trip. What can I do with my stairs?  Do not leave any items on the stairs.  Make sure that there are handrails on both sides of the stairs and use them. Fix handrails that are broken or loose. Make sure that handrails are as long as the stairways.  Check any carpeting to make sure that it is firmly attached to the stairs. Fix any carpet that is loose or worn.  Avoid having throw rugs at the top or bottom of the stairs. If you do have throw rugs, attach them to the floor with carpet tape.  Make sure that you have a light switch at the top of the stairs and the bottom of the stairs. If you do not have them, ask someone to add them for you. What else can I do to help prevent falls?  Wear shoes that:  Do not have high heels.  Have rubber bottoms.  Are comfortable and fit you well.  Are closed at the toe. Do not wear sandals.  If you use a stepladder:  Make sure that it is fully opened. Do not climb a closed stepladder.  Make sure that both sides of the stepladder are locked into place.  Ask someone to hold it for you, if possible.  Clearly mark and make sure that you can see:  Any grab bars or handrails.  First and last steps.  Where the edge of each step is.  Use tools that help you move around (mobility aids) if they are needed. These include:  Canes.  Walkers.  Scooters.  Crutches.  Turn on the lights when you go into a dark area. Replace any light bulbs as soon as they burn out.  Set up your furniture so you have a clear path. Avoid moving your furniture around.  If any of your floors are uneven, fix them.  If there are any pets around you, be aware of where they are.  Review your medicines with your doctor. Some medicines can make you feel dizzy. This can increase your chance  of falling. Ask your doctor what other things that you can do to help prevent falls. This information is not intended to replace advice given to you by your health care provider. Make sure you discuss any questions you have with your health care provider. Document Released: 08/28/2009 Document Revised: 04/08/2016 Document Reviewed: 12/06/2014 Elsevier Interactive Patient Education  2017 [place].

## 2020-02-18 NOTE — Assessment & Plan Note (Signed)
Stable, continue present medications.   

## 2020-02-18 NOTE — Progress Notes (Signed)
BP (!) 141/85 (BP Location: Left Arm, Cuff Size: Normal)   Pulse 86   Temp 98.5 F (36.9 C) (Oral)   Wt 165 lb 3.2 oz (74.9 kg)   LMP  (LMP Unknown)   SpO2 94%   BMI 31.21 kg/m    Subjective:    Patient ID: Melinda Preston, female    DOB: 04-Nov-1928, 86 y.o.   MRN: [id number]  HPI: Melinda Preston is a 86 y.o. female  Chief Complaint  Patient presents with  . Hyperlipidemia  . Hypertension  . Gastroesophageal Reflux   Dizzyness Presents with 3 months of dizzyness, worse in the morning when she gets up and at night when she lays back.  Fell x1 and hit her head.  No concussion or LOC.  Feels she got her feet tangled up    Hypertension/CHF Using medications without difficulty: This is stable.   Average home BPs: Lower at home doesn't remember numbers    States fell but doesn't know why.  She is lightheaded at times.   shortness of breath at times.   Swelling Sees Dr. Josefa Half.     Hyperlipidemia Using medications without problems No Muscle aches  Diet compliance: eats well balanced diet Exercise: None  GERD Stable on current medications   Relevant past medical, surgical, family and social history reviewed and updated as indicated. Interim medical history since our last visit reviewed. Allergies and medications reviewed and updated.  Review of Systems  Per HPI unless specifically indicated above     Objective:    BP (!) 141/85 (BP Location: Left Arm, Cuff Size: Normal)   Pulse 86   Temp 98.5 F (36.9 C) (Oral)   Wt 165 lb 3.2 oz (74.9 kg)   LMP  (LMP Unknown)   SpO2 94%   BMI 31.21 kg/m   Wt Readings from Last 3 Encounters:  02/18/20 165 lb 3.2 oz (74.9 kg)  06/05/19 167 lb (75.8 kg)  08/25/18 167 lb (75.8 kg)    Physical Exam Constitutional:      General: She is not in acute distress.    Appearance: Normal appearance. She is well-developed.  HENT:     Head: Normocephalic and atraumatic.  Eyes:     General: Lids are normal. No  scleral icterus.       Right eye: No discharge.        Left eye: No discharge.     Conjunctiva/sclera: Conjunctivae normal.  Neck:     Vascular: No carotid bruit or JVD.  Cardiovascular:     Rate and Rhythm: Normal rate. Rhythm irregular.     Heart sounds: Normal heart sounds.  Pulmonary:     Effort: Pulmonary effort is normal.     Breath sounds: Normal breath sounds.  Abdominal:     Palpations: There is no hepatomegaly or splenomegaly.  Musculoskeletal:        General: Normal range of motion.     Cervical back: Normal range of motion and neck supple.     Left lower leg: Edema present.  Skin:    General: Skin is warm and dry.     Coloration: Skin is not pale.     Findings: No rash.  Neurological:     General: No focal deficit present.     Mental Status: She is alert and oriented to person, place, and time. Mental status is at baseline.  Psychiatric:        Behavior: Behavior normal.  Thought Content: Thought content normal.        Judgment: Judgment normal.    Orthostatics with 10 point difference sitting and standing Get up and go normal but gait atalgic  Results for orders placed or performed in visit on 07/18/19  HM DIABETES EYE EXAM  Result Value Ref Range   HM Diabetic Eye Exam Retinopathy (A) No Retinopathy      Assessment & Plan:   Problem List Items Addressed This Visit      Unprioritized   Essential hypertension    Stable, continue present medications.        Heart failure (HCC)    F/u with Dr. Cassie Freer.  Concern contributing to dizzyness       Other Visit Diagnoses    Dizziness    -  Primary   Gait atalgic.  Suspect BPV and generalized weakness.  Refer to PT for strengthening and vestibular training   Relevant Orders   Ambulatory referral to Physical Therapy   CBC with Differential/Platelet   Comprehensive metabolic panel   TSH   At high risk for falls       Refer to PT       Follow up plan: Return in about 3 months (around  05/19/2020). for assessment of dizzyness.  Check CBC, CMP, and TSH

## 2020-02-18 NOTE — Progress Notes (Signed)
Subjective:   Anaiza Behrens is a 86 y.o. female who presents for Medicare Annual (Subsequent) preventive examination.  This visit is being conducted via phone call  - after an attmept to do on video chat - due to the COVID-19 pandemic. This patient has given me verbal consent via phone to conduct this visit, patient states they are participating from their home address. Some vital signs may be absent or patient reported.   Patient identification: identified by name, DOB, and current address.    Review of Systems:  Cardiac Risk Factors include: advanced age (>8men, >14 women);dyslipidemia;hypertension     Objective:     Vitals: LMP  (LMP Unknown)   There is no height or weight on file to calculate BMI.  Advanced Directives 02/18/2020 07/07/2017 03/22/2016  Does Patient Have a Medical Advance Directive? Yes Yes No  Type of Advance Directive Living will;Healthcare Power of [employer] Power of [place];Living will -  Copy of Healthcare Power of Attorney in Chart? No - copy requested No - copy requested -    Tobacco Social History   Tobacco Use  Smoking Status Never Smoker  Smokeless Tobacco Never Used     Counseling given: Not Answered   Clinical Intake:  Pre-visit preparation completed: Yes  Pain : No/denies pain     Nutritional Risks: None Diabetes: No  How often do you need to have someone help you when you read instructions, pamphlets, or other written materials from your doctor or pharmacy?: 1 - Never  Interpreter Needed?: No  Information entered by :: Marijane Trower,LPN  Past Medical History:  Diagnosis Date  . Anxiety   . Aortic heart valve narrowing   . Atrial fibrillation (HCC)   . Diabetes mellitus without complication (HCC)   . Disorder of aorta (HCC)   . Hyperlipidemia   . Hypertension   . Insomnia   . Overactive bladder    Past Surgical History:  Procedure Laterality Date  . APPENDECTOMY    . rotator cuff surgery Left     Family History  Problem Relation Age of Onset  . Heart disease Father   . Heart disease Brother   . Diabetes Son   . Breast cancer Sister    Social History   Socioeconomic History  . Marital status: Widowed    Spouse name: Not on file  . Number of children: Not on file  . Years of education: Not on file  . Highest education level: Not on file  Occupational History  . Not on file  Tobacco Use  . Smoking status: Never Smoker  . Smokeless tobacco: Never Used  Substance and Sexual Activity  . Alcohol use: No  . Drug use: No  . Sexual activity: Yes  Other Topics Concern  . Not on file  Social History Narrative  . Not on file   Social Determinants of Health   Financial Resource Strain:   . Difficulty of Paying Living Expenses:   Food Insecurity:   . Worried About Programme researcher, broadcasting/film/video in the Last Year:   . Barista in the Last Year:   Transportation Needs:   . Freight forwarder (Medical):   Marland Kitchen Lack of Transportation (Non-Medical):   Physical Activity:   . Days of Exercise per Week:   . Minutes of Exercise per Session:   Stress:   . Feeling of Stress :   Social Connections:   . Frequency of Communication with Friends and Family:   .  Frequency of Social Gatherings with Friends and Family:   . Attends Religious Services:   . Active Member of Clubs or Organizations:   . Attends Banker Meetings:   Marland Kitchen Marital Status:     Outpatient Encounter Medications as of 02/18/2020  Medication Sig  . acetaminophen (TYLENOL) 650 MG CR tablet Take 650 mg by mouth every 8 (eight) hours as needed for pain.  Marland Kitchen apixaban (ELIQUIS) 2.5 MG TABS tablet Take 1 tablet (2.5 mg total) by mouth 2 (two) times daily.  Marland Kitchen aspirin 81 MG tablet Take 81 mg by mouth daily.  Marland Kitchen atorvastatin (LIPITOR) 20 MG tablet Take 1 tablet (20 mg total) by mouth at bedtime.  . Calcium Carb-Cholecalciferol (CALCIUM 600+D3 PO) Take by mouth.  . carvedilol (COREG) 3.125 MG tablet Take 1 tablet  (3.125 mg total) by mouth 2 (two) times daily with a meal.  . diltiazem (CARDIZEM CD) 180 MG 24 hr capsule TAKE 1 CAPSULE(180 MG) BY MOUTH DAILY  . furosemide (LASIX) 40 MG tablet Take 40 mg by mouth 2 (two) times daily.  Marland Kitchen losartan (COZAAR) 25 MG tablet TAKE 1 TABLET(25 MG) BY MOUTH DAILY  . Omega-3 Fatty Acids (FISH OIL) 1000 MG CAPS Take 1,000 mg by mouth daily.  Marland Kitchen omeprazole (PRILOSEC) 20 MG capsule TAKE 1 CAPSULE(20 MG) BY MOUTH DAILY  . potassium chloride SA (KLOR-CON) 20 MEQ tablet TAKE 1 TABLET(20 MEQ) BY MOUTH DAILY  . sertraline (ZOLOFT) 25 MG tablet TAKE 1 TABLET(25 MG) BY MOUTH DAILY  . traZODone (DESYREL) 50 MG tablet TAKE 1 TABLET(50 MG) BY MOUTH AT BEDTIME  . VITAMIN D, CHOLECALCIFEROL, PO Take 600 Int'l Units/day by mouth.   No facility-administered encounter medications on file as of 02/18/2020.    Activities of Daily Living In your present state of health, do you have any difficulty performing the following activities: 02/18/2020 06/05/2019  Hearing? N N  Comment no hearing aids -  Vision? N N  Comment eyeglasses,Dr.Woodard -  Difficulty concentrating or making decisions? N N  Walking or climbing stairs? N N  Dressing or bathing? N N  Doing errands, shopping? N N  Preparing Food and eating ? N -  Using the Toilet? N -  In the past six months, have you accidently leaked urine? Y -  Do you have problems with loss of bowel control? N -  Managing your Medications? N -  Managing your Finances? N -  Housekeeping or managing your Housekeeping? N -  Some recent data might be hidden    Patient Care Team: Marjie Skiff, NP as PCP - General (Nurse Practitioner) Marcina Millard, MD as Consulting Physician (Cardiology) Anson [place], PA-C as Physician Assistant (Orthopedic Surgery)    Assessment:   This is a routine wellness examination for [facility].  Exercise Activities and Dietary recommendations Current Exercise Habits: The patient has a physically  strenuous job, but has no regular exercise apart from work., Intensity: Mild, Exercise limited by: None identified  Goals Addressed   None     Fall Risk: Fall Risk  02/18/2020 06/05/2019 03/02/2019 08/25/2018 07/07/2017  Falls in the past year? 1 0 0 No No  Number falls in past yr: 0 0 0 - -  Injury with Fall? 0 0 0 - -  Follow up - Falls evaluation completed - - -    FALL RISK PREVENTION PERTAINING TO THE HOME:  Any stairs in or around the home? Yes 3 steps going in home  If so, are there  any without handrails? No   Home free of loose throw rugs in walkways, pet beds, electrical cords, etc? Yes  Adequate lighting in your home to reduce risk of falls? Yes   ASSISTIVE DEVICES UTILIZED TO PREVENT FALLS:  Life alert? No  Use of a cane, walker or w/c? No  walker as needed  Grab bars in the bathroom? Yes  Shower chair or bench in shower? No  Elevated toilet seat or a handicapped toilet? Yes   DME ORDERS:  DME order needed?  No   TIMED UP AND GO:  Unable to perform    Depression Screen PHQ 2/9 Scores 02/18/2020 09/11/2019 06/05/2019 08/25/2018  PHQ - 2 Score 0 0 1 0  PHQ- 9 Score 2 1 3  0     Cognitive Function     6CIT Screen 07/07/2017  What Year? 0 points  What month? 0 points  What time? 0 points  Count back from 20 0 points  Months in reverse 0 points  Repeat phrase 2 points  Total Score 2    Immunization History  Administered Date(s) Administered  . Fluad Quad(high Dose 65+) 09/11/2019  . Influenza, High Dose Seasonal PF 09/17/2016, 08/09/2017, 08/25/2018  . Influenza, Seasonal, Injecte, Preservative Fre 10/29/2013  . Influenza,inj,Quad PF,6+ Mos 10/30/2014  . Influenza-Unspecified 08/29/2015  . Pneumococcal Conjugate-13 04/23/2014  . Pneumococcal Polysaccharide-23 03/12/2015  . Tdap 01/31/2017  . Zoster 07/15/2006    Qualifies for Shingles Vaccine? Yes  Zostavax completed 2007. Due for Shingrix. Education has been provided regarding the importance of  this vaccine. Pt has been advised to call insurance company to determine out of pocket expense. Advised may also receive vaccine at local pharmacy or Health Dept. Verbalized acceptance and understanding.   Tdap: up to date   Flu Vaccine: up to date   Pneumococcal Vaccine: up to date    Covid-19 Vaccine:  Completed vaccines  Screening Tests Health Maintenance  Topic Date Due  . FOOT EXAM  09/10/2020 (Originally 03/22/2017)  . INFLUENZA VACCINE  06/15/2020  . OPHTHALMOLOGY EXAM  07/15/2020  . TETANUS/TDAP  02/01/2027  . DEXA SCAN  Completed  . PNA vac Low Risk Adult  Completed  . HEMOGLOBIN A1C  Discontinued    Cancer Screenings:  Colorectal Screening: no longer required   Mammogram: no longer required   Bone Density: completed 04/29/2016  Lung Cancer Screening: (Low Dose CT Chest recommended if Age 38-80 years, 30 pack-year currently smoking OR have quit w/in 15years.) does not qualify.    Additional Screening:  Hepatitis C Screening: does not qualify  Vision Screening: Recommended annual ophthalmology exams for early detection of glaucoma and other disorders of the eye. Is the patient up to date with their annual eye exam?  Yes  Who is the provider or what is the name of the office in which the pt attends annual eye exams? Dr.Woodard     Dental Screening: Recommended annual dental exams for proper oral hygiene  Community Resource Referral:  CRR required this visit?  No       Plan:  I have personally reviewed and addressed the Medicare Annual Wellness questionnaire and have noted the following in the patient's chart:  A. Medical and social history B. Use of alcohol, tobacco or illicit drugs  C. Current medications and supplements D. Functional ability and status E.  Nutritional status F.  Physical activity G. Advance directives H. List of other physicians I.  Hospitalizations, surgeries, and ER visits in previous 12 months J.  Vitals K. Screenings such as  hearing and vision if needed, cognitive and depression L. Referrals and appointments   In addition, I have reviewed and discussed with patient certain preventive protocols, quality metrics, and best practice recommendations. A written personalized care plan for preventive services as well as general preventive health recommendations were provided to patient.  Signed,    Bevelyn Ngo, LPN  03/23/7470 Nurse Health Advisor   Nurse Notes: none

## 2020-02-19 LAB — CBC WITH DIFFERENTIAL/PLATELET
Basophils Absolute: 0 10*3/uL (ref 0.0–0.2)
Basos: 1 %
EOS (ABSOLUTE): 0 10*3/uL (ref 0.0–0.4)
Eos: 0 %
Hematocrit: 44.1 % (ref 34.0–46.6)
Hemoglobin: 14.2 g/dL (ref 11.1–15.9)
Immature Grans (Abs): 0 10*3/uL (ref 0.0–0.1)
Immature Granulocytes: 0 %
Lymphocytes Absolute: 1.4 10*3/uL (ref 0.7–3.1)
Lymphs: 20 %
MCH: 25.2 pg — ABNORMAL LOW (ref 26.6–33.0)
MCHC: 32.2 g/dL (ref 31.5–35.7)
MCV: 78 fL — ABNORMAL LOW (ref 79–97)
Monocytes Absolute: 0.6 10*3/uL (ref 0.1–0.9)
Monocytes: 8 %
Neutrophils Absolute: 5.2 10*3/uL (ref 1.4–7.0)
Neutrophils: 71 %
Platelets: 211 10*3/uL (ref 150–450)
RBC: 5.64 x10E6/uL — ABNORMAL HIGH (ref 3.77–5.28)
RDW: 18.1 % — ABNORMAL HIGH (ref 11.7–15.4)
WBC: 7.3 10*3/uL (ref 3.4–10.8)

## 2020-02-19 LAB — COMPREHENSIVE METABOLIC PANEL
ALT: 15 IU/L (ref 0–32)
AST: 17 IU/L (ref 0–40)
Albumin/Globulin Ratio: 2 (ref 1.2–2.2)
Albumin: 4.3 g/dL (ref 3.6–4.6)
Alkaline Phosphatase: 78 IU/L (ref 39–117)
BUN/Creatinine Ratio: 16 (ref 12–28)
BUN: 13 mg/dL (ref 8–27)
Bilirubin Total: 0.5 mg/dL (ref 0.0–1.2)
CO2: 27 mmol/L (ref 20–29)
Calcium: 9.5 mg/dL (ref 8.7–10.3)
Chloride: 101 mmol/L (ref 96–106)
Creatinine, Ser: 0.82 mg/dL (ref 0.57–1.00)
GFR calc Af Amer: 73 mL/min/{1.73_m2} (ref >59)
GFR calc non Af Amer: 64 mL/min/{1.73_m2} (ref >59)
Globulin, Total: 2.1 g/dL (ref 1.5–4.5)
Glucose: 118 mg/dL — ABNORMAL HIGH (ref 65–99)
Potassium: 3.8 mmol/L (ref 3.5–5.2)
Sodium: 143 mmol/L (ref 134–144)
Total Protein: 6.4 g/dL (ref 6.0–8.5)

## 2020-02-19 LAB — TSH: TSH: 1.95 u[IU]/mL (ref 0.450–4.500)

## 2020-02-20 DIAGNOSIS — I340 Nonrheumatic mitral (valve) insufficiency: Secondary | ICD-10-CM | POA: Diagnosis not present

## 2020-02-20 DIAGNOSIS — I5022 Chronic systolic (congestive) heart failure: Secondary | ICD-10-CM | POA: Diagnosis not present

## 2020-02-20 DIAGNOSIS — I4819 Other persistent atrial fibrillation: Secondary | ICD-10-CM | POA: Diagnosis not present

## 2020-02-20 DIAGNOSIS — I10 Essential (primary) hypertension: Secondary | ICD-10-CM | POA: Diagnosis not present

## 2020-02-20 DIAGNOSIS — I420 Dilated cardiomyopathy: Secondary | ICD-10-CM | POA: Diagnosis not present

## 2020-02-27 ENCOUNTER — Ambulatory Visit: Payer: Medicare Other

## 2020-02-27 ENCOUNTER — Ambulatory Visit: Payer: Medicare Other | Attending: Unknown Physician Specialty | Admitting: Physical Therapy

## 2020-02-27 ENCOUNTER — Encounter: Payer: Self-pay | Admitting: Physical Therapy

## 2020-02-27 ENCOUNTER — Other Ambulatory Visit: Payer: Self-pay

## 2020-02-27 DIAGNOSIS — H8112 Benign paroxysmal vertigo, left ear: Secondary | ICD-10-CM

## 2020-02-27 DIAGNOSIS — R42 Dizziness and giddiness: Secondary | ICD-10-CM | POA: Diagnosis not present

## 2020-02-27 DIAGNOSIS — R2681 Unsteadiness on feet: Secondary | ICD-10-CM | POA: Diagnosis not present

## 2020-02-27 NOTE — Therapy (Addendum)
[place] [facility] [address] [place], [place], [address] Phone: [phone]   Fax:  [phone]  Physical Therapy Evaluation  Patient Details  Name: Melinda Preston MRN: [id number] Date of Birth: 07-21-1928 Referring Provider (PT): Kathrine Haddock, NP   Encounter Date: 02/27/2020  PT End of Session - 02/27/20 1804    Visit Number  1    Number of Visits  9    Date for PT Re-Evaluation  04/23/20    PT Start Time  1430    PT Stop Time  1520    PT Time Calculation (min)  50 min    Activity Tolerance  Patient tolerated treatment well    Behavior During Therapy  [facility] for tasks assessed/performed       Past Medical History:  Diagnosis Date  . Anxiety   . Aortic heart valve narrowing   . Atrial fibrillation ([place])   . Diabetes mellitus without complication ([place])   . Disorder of aorta ([place])   . Hyperlipidemia   . Hypertension   . Insomnia   . Overactive bladder     Past Surgical History:  Procedure Laterality Date  . APPENDECTOMY    . rotator cuff surgery Left     There were no vitals filed for this visit.       [facility] PT Assessment - 02/27/20 1814      Assessment   Medical Diagnosis  dizziness and giddiness    Referring Provider (PT)  Kathrine Haddock, NP    Prior Therapy  no prior vestibular therapy      Precautions   Precautions  Fall      Restrictions   Weight Bearing Restrictions  No      Balance Screen   Has the patient fallen in the past 6 months  Yes    How many times?  1    Has the patient had a decrease in activity level because of a fear of falling?   No    Is the patient reluctant to leave their home because of a fear of falling?   No      Home Environment   Living Environment  Private residence    Living Arrangements  Children    Available Help at Discharge  Family    Type of [place] to enter    Entrance Stairs-Number of Steps  4    Entrance Stairs-Rails   Right;Left;Can reach both    [place]  One level    [place] - 4 wheels      Prior Function   Level of Independence  Independent with community mobility without device      Cognition   Overall Cognitive Status  Within Functional Limits for tasks assessed         Subjective Assessment - 02/29/20 1603    Subjective  Patient states her dizziness has gotten worse in the past few weeks.    Pertinent History  Patient reports she first started getting a little dizziness last year but that it got worse after she fell in the kitchen striking the left side of her head on the wall. Patient reports the dizziness is a "whole lot better this week", but states for a little over 2 weeks every time she lays down she gets vertigo and states that she feels her eyes are going backwards up in her head. Patient reports the vertigo lasts minutes.  Patient reports lying down brings on her symptoms and states nothing makes it better.    Patient Stated Goals  to have decreased dizziness          VESTIBULAR AND BALANCE EVALUATION   HISTORY:  Subjective history of current problem: Patient reports she first started getting a little dizziness last year but that it got worse after she fell in the kitchen striking the left side of her head on the wall. Patient reports the dizziness is a "whole lot better this week", but states for a little over 2 weeks every time she lays down she gets vertigo and states that she feels her eyes are going backwards up in her head. Patient reports the vertigo lasts minutes. Patient reports lying down brings on her symptoms and states nothing makes it better.  Description of dizziness: vertigo, unsteadiness, lightheadedness, falling Frequency: daily Duration: minutes Symptom nature: motion provoked, positional, intermittent  Progression of symptoms: better History of similar episodes: none  Falls (yes/no): yes Number of falls in past 6 months: 1  Auditory  complaints (tinnitus, pain, drainage): denies  Vision (last eye exam, diplopia, recent changes): wears eye glasses; denies changes;     EXAMINATION  SOMATOSENSORY:  Any N & T in extremities or weakness: denies    COORDINATION: Finger to Nose:Normal Past Pointing:   Normal  MUSCULOSKELETAL SCREEN: Cervical Spine ROM: WFL AROM flexion, extension, rotation left and right without pain   Functional Mobility: Sit to stand independently  Gait: Patient arrives ambulating without AD. Patient ambulates with slow cadence with mild veering and imbalance noted.  Scanning of visual environment with gait is: fair  Balance: Patient demonstrates mild imbalance with ambulating in gym 150' times 2.  Further balance TBD once Left BPPV is cleared.   POSTURAL CONTROL TESTS:  Clinical Test of Sensory Interaction for Balance (CTSIB): TBA  OCULOMOTOR / VESTIBULAR TESTING: Oculomotor Exam- Room Light  Normal Abnormal Comments  Ocular Alignment N    Ocular ROM N    Spontaneous Nystagmus N    Gaze evoked Nystagmus  Abn Age appropriate at end range  Smooth Pursuit N    Saccades  Abn Hypometric saccades with left field gaze  VOR   Deferred secondary to positive left DH   VOR Cancellation   Deferred secondary to positive left DH   Left Head Impulse   Deferred secondary to positive left DH   Right Head Impulse   Deferred secondary to positive left DH   Head Shaking Nystagmus   Deferred secondary to positive left DH     BPPV TESTS:  Symptoms Duration Intensity Nystagmus  Left Dix-Hallpike vertigo About 30-45 seconds 3/10 Left upbeating torsional nystagmus of short duration  Right Dix-Hallpike None  N/A None observed    FUNCTIONAL OUTCOME MEASURES:  Results Comments  DHI - TBA next visit  ABC Scale     63% High Falls risk; in need of intervention  FOTO    59/100 Given the patient's risk adjustment variables, like-patients nationally had a FS score of 51/100 at intake and predicted score of  73/100 at discharge   Canalith Repositioning Maneuver: On mat table, performed left Dix-Hallpike testing and it was positive with left upbeating torsional nystagmus of short duration without latency. Performed left canalith repositioning maneuver (CRT). Repeated left CRT for a total of one maneuver. Patient reported vertigo with rolling onto her right side with nose pointed towards floor position, therefore, wiill plan on retesting Right DH test. Patient reported 3/10 vertigo.  PT Education - 02/27/20 1802    Education Details  discussed plan of care and BPPV; provided BPPV handout from vestibular.org    Person(s) Educated  Patient    Methods  Explanation;Handout    Comprehension  Verbalized understanding       PT Short Term Goals - 02/27/20 1804      PT SHORT TERM GOAL #1   Title  Patient will report 50% or greater improvement in her symptoms of dizziness and imbalance with provoking motions or positions    Time  4    Period  Weeks    Status  New    Target Date  03/26/20        PT Long Term Goals - 02/27/20 1805      PT LONG TERM GOAL #1   Title  Patient will report 80% or greater improvement in her symptoms of dizziness and imbalance with provoking motions or positions    Time  8    Period  Weeks    Status  New    Target Date  04/23/20      PT LONG TERM GOAL #2   Title  Patient will reduce falls risk as indicated by Activities Specific Balance Confidence Scale (ABC) >67%.    Baseline  scored  63% on 02/27/20    Time  8    Period  Weeks    Status  New    Target Date  04/23/20      PT LONG TERM GOAL #3   Title  Patient will improve functional independence as evidenced by FOTO score of 65/100 or greater.    Baseline  scored  59/100 on 02/27/20    Time  8    Period  Weeks    Status  New    Target Date  04/23/20          Plan - 02/29/20 1623    Clinical Impression Statement  Patient reports that she began having some dizziness last year but that it worsened after  she had a fall a few weeks ago striking the left side of her head on the wall. Patient began to have episodes of vertigo lasting minutes whenever she would lie down. Patient with mildly unsteady gait pattern with ambulating in the clinic Patient had positive left Dix-Hallpike test and performed Epley maneuver. Patient also noted to have hypometric saccades with vestibular testing which is a potential central sign. Patient would benefit from PT services to address functional deficits and goals as set on plan of care. Will plan on repeating left DH test next visit and performing Epley maneuvers if indicated.    Personal Factors and Comorbidities  Comorbidity 3+    Comorbidities  anxiety, HTN, A-Fib, DM    Stability/Clinical Decision Making  Evolving/Moderate complexity    Rehab Potential  Good    PT Frequency  1x / week    PT Duration  8 weeks    PT Treatment/Interventions  Canalith Repostioning;Gait training;Stair training;Functional mobility training;Therapeutic activities;Therapeutic exercise;Balance training;Neuromuscular re-education;Patient/family education;Vestibular    PT Next Visit Plan  repeat DH test and Epley maneuver if indicated    Consulted and Agree with Plan of Care  Patient             Patient will benefit from skilled therapeutic intervention in order to improve the following deficits and impairments:     Visit Diagnosis: Dizziness and giddiness  BPPV (benign paroxysmal positional vertigo), left  Unsteadiness on feet     Problem  List Patient Active Problem List   Diagnosis Date Noted  . IFG (impaired fasting glucose) 02/21/2018  . GERD without esophagitis 02/21/2018  . Advanced care planning/counseling discussion 08/09/2017  . Heart failure (HCC) 10/25/2016  . Hypercholesterolemia 12/22/2015  . Essential hypertension 12/22/2015  . Depression 12/22/2015  . Chronic atrial fibrillation (HCC) 12/22/2015  . Arthritis, senescent 12/22/2015  . Anemia 12/22/2015   . Insomnia 12/22/2015  . Overactive bladder 12/22/2015   Mardelle Matte PT, DPT [phone] Mardelle Matte 02/27/2020, 6:16 PM  [place] [facility] [address] [place], [place], [address] Phone: [phone]   Fax:  [phone]  Name: Melinda Preston MRN: [id number] Date of Birth: [birth year]

## 2020-02-29 NOTE — Addendum Note (Signed)
Addended by: Leanor Rubenstein on: 02/29/2020 04:30 PM   Modules accepted: Orders

## 2020-03-07 ENCOUNTER — Other Ambulatory Visit: Payer: Self-pay

## 2020-03-07 ENCOUNTER — Encounter: Payer: Self-pay | Admitting: Physical Therapy

## 2020-03-07 ENCOUNTER — Ambulatory Visit: Payer: Medicare Other | Admitting: Physical Therapy

## 2020-03-07 DIAGNOSIS — H8112 Benign paroxysmal vertigo, left ear: Secondary | ICD-10-CM

## 2020-03-07 DIAGNOSIS — R42 Dizziness and giddiness: Secondary | ICD-10-CM | POA: Diagnosis not present

## 2020-03-07 DIAGNOSIS — R2681 Unsteadiness on feet: Secondary | ICD-10-CM | POA: Diagnosis not present

## 2020-03-07 NOTE — Therapy (Signed)
[place] [facility] [address] [place], [place], [address] Phone: [phone]   Fax:  [phone]  Physical Therapy Treatment  Patient Details  Name: Melinda Preston MRN: [id number] Date of Birth: [birth year] Referring Provider (PT): Gabriel Cirri, NP   Encounter Date: 03/07/2020  PT End of Session - 03/07/20 0806    Visit Number  2    Number of Visits  9    Date for PT Re-Evaluation  04/23/20    PT Start Time  0800    PT Stop Time  0825    PT Time Calculation (min)  25 min    Activity Tolerance  Patient tolerated treatment well    Behavior During Therapy  [facility] for tasks assessed/performed       Past Medical History:  Diagnosis Date  . Anxiety   . Aortic heart valve narrowing   . Atrial fibrillation (HCC)   . Diabetes mellitus without complication (HCC)   . Disorder of aorta (HCC)   . Hyperlipidemia   . Hypertension   . Insomnia   . Overactive bladder     Past Surgical History:  Procedure Laterality Date  . APPENDECTOMY    . rotator cuff surgery Left     There were no vitals filed for this visit.  Subjective Assessment - 03/07/20 0804    Subjective  Patient states that she has been doing better since last visit. Patient states the morning after the Epley maneuver she had mild dizziness when she got up in the morning.    Pertinent History  Patient reports she first started getting a little dizziness last year but that it got worse after she fell in the kitchen striking the left side of her head on the wall. Patient reports the dizziness is a "whole lot better this week", but states for a little over 2 weeks every time she lays down she gets vertigo and states that she feels her eyes are going backwards up in her head. Patient reports the vertigo lasts minutes. Patient reports lying down brings on her symptoms and states nothing makes it better.    Patient Stated Goals  to have decreased dizziness       Neuromuscular  Re-education:  Canalith Repositioning Maneuver: On mat table, performed left Dix-Hallpike and no nystagmus was observed. Performed Epley maneuver though to try to ensure clearance of particles. Performed left canalith repositioning maneuver (CRT). Repeated left CRT for a total of 2 maneuvers with retesting between maneuvers. Patient reported no vertigo with maneuvers.      PT Education - 03/07/20 0805    Education Details  discussed BPPV and Epley maneuver as well as plan of care; poatinet reports she read the handout materials from last week.    Person(s) Educated  Patient    Methods  Explanation    Comprehension  Verbalized understanding       PT Short Term Goals - 02/27/20 1804      PT SHORT TERM GOAL #1   Title  Patient will report 50% or greater improvement in her symptoms of dizziness and imbalance with provoking motions or positions    Time  4    Period  Weeks    Status  New    Target Date  03/26/20        PT Long Term Goals - 02/27/20 1805      PT LONG TERM GOAL #1   Title  Patient will report 80% or greater improvement in her  symptoms of dizziness and imbalance with provoking motions or positions    Time  8    Period  Weeks    Status  New    Target Date  04/23/20      PT LONG TERM GOAL #2   Title  Patient will reduce falls risk as indicated by Activities Specific Balance Confidence Scale (ABC) >67%.    Baseline  scored  63% on 02/27/20    Time  8    Period  Weeks    Status  New    Target Date  04/23/20      PT LONG TERM GOAL #3   Title  Patient will improve functional independence as evidenced by FOTO score of 65/100 or greater.    Baseline  scored  59/100 on 02/27/20    Time  8    Period  Weeks    Status  New    Target Date  04/23/20            Plan - 03/07/20 3500    Clinical Impression Statement  Patient returns to the clinic reporting that she had mild dizziness the morning after the Epley maneuver and that she has not had any other episodes  of vertigo this past week. Patient with negative left Dix-Hallpike test however performed two Epley maneuvers to help ensure clearance of particles. Will plan to have patient return to clinic next week and repeat Dix-Hallpike test and discuss whether patient would like to continue PT services to focus on balance training. Patient encouraged to follow-up as indicated.    Personal Factors and Comorbidities  Comorbidity 3+    Comorbidities  anxiety, HTN, A-Fib, DM    Stability/Clinical Decision Making  Evolving/Moderate complexity    Rehab Potential  Good    PT Frequency  1x / week    PT Duration  8 weeks    PT Treatment/Interventions  Canalith Repostioning;Gait training;Stair training;Functional mobility training;Therapeutic activities;Therapeutic exercise;Balance training;Neuromuscular re-education;Patient/family education;Vestibular    PT Next Visit Plan  repeat DH test and Epley maneuver if indicated    Consulted and Agree with Plan of Care  Patient       Patient will benefit from skilled therapeutic intervention in order to improve the following deficits and impairments:  Decreased balance, Dizziness, Decreased strength, Difficulty walking  Visit Diagnosis: Dizziness and giddiness  BPPV (benign paroxysmal positional vertigo), left  Unsteadiness on feet     Problem List Patient Active Problem List   Diagnosis Date Noted  . IFG (impaired fasting glucose) 02/21/2018  . GERD without esophagitis 02/21/2018  . Advanced care planning/counseling discussion 08/09/2017  . Heart failure ([place]) 10/25/2016  . Hypercholesterolemia 12/22/2015  . Essential hypertension 12/22/2015  . Depression 12/22/2015  . Chronic atrial fibrillation ([place]) 12/22/2015  . Arthritis, senescent 12/22/2015  . Anemia 12/22/2015  . Insomnia 12/22/2015  . Overactive bladder 12/22/2015   Lady Deutscher PT, DPT [phone] Lady Deutscher 03/07/2020, 8:31 AM  [place] [facility] [address] [place], [place], [address] Phone: [phone]   Fax:  [phone]  Name: Melinda Preston MRN: [id number] Date of Birth: [birth year]

## 2020-03-11 ENCOUNTER — Ambulatory Visit: Payer: Medicare Other | Admitting: Nurse Practitioner

## 2020-03-13 ENCOUNTER — Other Ambulatory Visit: Payer: Self-pay

## 2020-03-13 ENCOUNTER — Encounter: Payer: Self-pay | Admitting: Physical Therapy

## 2020-03-13 ENCOUNTER — Ambulatory Visit: Payer: Medicare Other | Admitting: Physical Therapy

## 2020-03-13 DIAGNOSIS — R2681 Unsteadiness on feet: Secondary | ICD-10-CM | POA: Diagnosis not present

## 2020-03-13 DIAGNOSIS — H8112 Benign paroxysmal vertigo, left ear: Secondary | ICD-10-CM | POA: Diagnosis not present

## 2020-03-13 DIAGNOSIS — R42 Dizziness and giddiness: Secondary | ICD-10-CM

## 2020-03-13 NOTE — Therapy (Addendum)
[place] [facility] [address] [place], [place], [address] Phone: [phone]   Fax:  [phone]  Physical Therapy Treatment  Patient Details  Name: Melinda Preston MRN: [id number] Date of Birth: 22-Nov-1927 Referring Provider (PT): Kathrine Haddock, NP   Encounter Date: 03/13/2020  PT End of Session - 03/13/20 1626    Visit Number  3    Number of Visits  9    Date for PT Re-Evaluation  04/23/20    PT Start Time  0175    PT Stop Time  1628    PT Time Calculation (min)  42 min    Activity Tolerance  Patient tolerated treatment well    Behavior During Therapy  [facility] for tasks assessed/performed       Past Medical History:  Diagnosis Date  . Anxiety   . Aortic heart valve narrowing   . Atrial fibrillation ([place])   . Diabetes mellitus without complication ([place])   . Disorder of aorta ([place])   . Hyperlipidemia   . Hypertension   . Insomnia   . Overactive bladder     Past Surgical History:  Procedure Laterality Date  . APPENDECTOMY    . rotator cuff surgery Left     There were no vitals filed for this visit.  Subjective Assessment - 03/14/20 1323    Subjective  Patient states she has not had any further episodes of vertigo or dizziness since the evaluation, but states she is having difficulties with balance.    Pertinent History  Patient reports she first started getting a little dizziness last year but that it got worse after she fell in the kitchen striking the left side of her head on the wall. Patient reports the dizziness is a "whole lot better this week", but states for a little over 2 weeks every time she lays down she gets vertigo and states that she feels her eyes are going backwards up in her head. Patient reports the vertigo lasts minutes. Patient reports lying down brings on her symptoms and states nothing makes it better.    Patient Stated Goals  to have decreased dizziness      Neuromuscular  Re-education:  Dix-Hallpike: Performed left and right Dix-Hallpike tests and both were negative with patient denying vertigo and no nystagmus observed.  Non-compliant/ Firm Surface: On firm surface, patient performed feet together progressions and semi-tandem progressions with alternating lead leg with and without body turns and horizontal and vertical head turns with CGA with faded UEs support progressing to intermittent reaching for support for balance of one hand.    Added to HEP.  Slow Marching: Patient performed on firm surface, slow marching with 5 second holds 2 sets of 10 reps with CGA with intermittent few use of hands or few finger support of one hand.  Patient has difficulty at times with holding for 5 seconds. Added to HEP.   Patient with one seated rest break during above activities.   Discussed safety precautions with performing HEP at home. Demonstrated and discussed standing in corner with chair in front for safety and then patient demonstrated. Patient to perform HEP standing in corner with chair in front of her for safety.   Ambulation with head turns:  Patient performed 175' forwards ambulation while scanning for targets in hallway.  Patient denied dizziness, but states she is a little short of breath and unsteady. Patient required seated rest break.     PT Short Term Goals - 02/27/20 1025  PT SHORT TERM GOAL #1   Title  Patient will report 50% or greater improvement in her symptoms of dizziness and imbalance with provoking motions or positions    Time  4    Period  Weeks    Status  New    Target Date  03/26/20        PT Long Term Goals - 02/27/20 1805      PT LONG TERM GOAL #1   Title  Patient will report 80% or greater improvement in her symptoms of dizziness and imbalance with provoking motions or positions    Time  8    Period  Weeks    Status  New    Target Date  04/23/20      PT LONG TERM GOAL #2   Title  Patient will reduce falls risk as  indicated by Activities Specific Balance Confidence Scale (ABC) >67%.    Baseline  scored  63% on 02/27/20    Time  8    Period  Weeks    Status  New    Target Date  04/23/20      PT LONG TERM GOAL #3   Title  Patient will improve functional independence as evidenced by FOTO score of 65/100 or greater.    Baseline  scored  59/100 on 02/27/20    Time  8    Period  Weeks    Status  New    Target Date  04/23/20            Plan - 03/14/20 1331    Clinical Impression Statement  Patient with negative Dix-Hallpike tests this date and patient reports no further episodes of vertigo and dizziness since evaluation. Patient however unsteady on her feet and had one loss of balance while ambulating in the clinic requiring min A to correct. Patient challenged by narrow base of support and single leg stance activities. Patient issued exercises for home exercise program and to perform standing in corner with chair in front for safety. Patient is at high risk for falls and would benefit from PT services to furhter address goals and functional deficits with emphasis on gait and balance training. Consider trying SPC next visit, reviewing HEP and working on progressions of balance activities.    Personal Factors and Comorbidities  Comorbidity 3+    Comorbidities  anxiety, HTN, A-Fib, DM    Stability/Clinical Decision Making  Evolving/Moderate complexity    Rehab Potential  Good    PT Frequency  1x / week    PT Duration  8 weeks    PT Treatment/Interventions  Canalith Repostioning;Gait training;Stair training;Functional mobility training;Therapeutic activities;Therapeutic exercise;Balance training;Neuromuscular re-education;Patient/family education;Vestibular    PT Next Visit Plan  repeat DH test and Epley maneuver if indicated    Consulted and Agree with Plan of Care  Patient       Patient will benefit from skilled therapeutic intervention in order to improve the following deficits and impairments:   Decreased balance, Dizziness, Decreased strength, Difficulty walking  Visit Diagnosis: Dizziness and giddiness  BPPV (benign paroxysmal positional vertigo), left  Unsteadiness on feet     Problem List Patient Active Problem List   Diagnosis Date Noted  . IFG (impaired fasting glucose) 02/21/2018  . GERD without esophagitis 02/21/2018  . Advanced care planning/counseling discussion 08/09/2017  . Heart failure (HCC) 10/25/2016  . Hypercholesterolemia 12/22/2015  . Essential hypertension 12/22/2015  . Depression 12/22/2015  . Chronic atrial fibrillation (HCC) 12/22/2015  . Arthritis, senescent 12/22/2015  .  Anemia 12/22/2015  . Insomnia 12/22/2015  . Overactive bladder 12/22/2015   Mardelle Matte PT, DPT [phone]  Mardelle Matte 03/14/2020, 1:35 PM  [place] [facility] [address] [place], [place], [address] Phone: [phone]   Fax:  [phone]  Name: Melinda Preston MRN: [id number] Date of Birth: October 17, 1928

## 2020-03-16 ENCOUNTER — Other Ambulatory Visit: Payer: Self-pay | Admitting: Nurse Practitioner

## 2020-03-16 NOTE — Telephone Encounter (Signed)
Requested Prescriptions  Pending Prescriptions Disp Refills  . losartan (COZAAR) 25 MG tablet [Pharmacy Med Name: LOSARTAN 25MG  TABLETS] 90 tablet 1    Sig: TAKE 1 TABLET(25 MG) BY MOUTH DAILY     Cardiovascular:  Angiotensin Receptor Blockers Failed - 03/16/2020  3:29 AM      Failed - Last BP in normal range    BP Readings from Last 1 Encounters:  02/18/20 (!) 141/85         Passed - Cr in normal range and within 180 days    Creatinine  Date Value Ref Range Status  06/20/2012 0.75 0.60 - 1.30 mg/dL Final   Creatinine, Ser  Date Value Ref Range Status  02/18/2020 0.82 0.57 - 1.00 mg/dL Final         Passed - K in normal range and within 180 days    Potassium  Date Value Ref Range Status  02/18/2020 3.8 3.5 - 5.2 mmol/L Final  06/20/2012 3.4 (L) 3.5 - 5.1 mmol/L Final         Passed - Patient is not pregnant      Passed - Valid encounter within last 6 months    Recent Outpatient Visits          3 weeks ago Dizziness   [facility], NP   6 months ago Chronic atrial fibrillation (HCC)   [facility] [name], Jolene T, NP   9 months ago Chronic systolic heart failure (HCC)   [facility] [place], [place] T, NP   1 year ago Chronic atrial fibrillation   [facility] [place], [place] T, NP   1 year ago Need for influenza vaccination   [facility] [place], [place], NP      Future Appointments            In 2 months [name], Dorie Rank, NP Dorie Rank, [facility]

## 2020-03-17 DIAGNOSIS — I420 Dilated cardiomyopathy: Secondary | ICD-10-CM | POA: Diagnosis not present

## 2020-03-17 DIAGNOSIS — I5022 Chronic systolic (congestive) heart failure: Secondary | ICD-10-CM | POA: Diagnosis not present

## 2020-03-17 DIAGNOSIS — R0602 Shortness of breath: Secondary | ICD-10-CM | POA: Diagnosis not present

## 2020-03-21 ENCOUNTER — Encounter: Payer: Self-pay | Admitting: Physical Therapy

## 2020-03-21 ENCOUNTER — Ambulatory Visit: Payer: Medicare Other | Attending: Unknown Physician Specialty | Admitting: Physical Therapy

## 2020-03-21 ENCOUNTER — Other Ambulatory Visit: Payer: Self-pay

## 2020-03-21 DIAGNOSIS — R42 Dizziness and giddiness: Secondary | ICD-10-CM | POA: Diagnosis not present

## 2020-03-21 DIAGNOSIS — R2681 Unsteadiness on feet: Secondary | ICD-10-CM

## 2020-03-21 NOTE — Therapy (Addendum)
[facility] [address] [place], [place], [address] Phone: [phone]   Fax:  [phone]  Physical Therapy Treatment  Patient Details  Name: Melinda Preston MRN: [id number] Date of Birth: 1928/03/03 Referring Provider (PT): Gabriel Cirri, NP   Encounter Date: 03/21/2020  PT End of Session - 03/21/20 0754    Visit Number  4    Number of Visits  9    Date for PT Re-Evaluation  04/23/20    PT Start Time  0751    PT Stop Time  0835    PT Time Calculation (min)  44 min    Equipment Utilized During Treatment  Gait belt    Activity Tolerance  Patient tolerated treatment well    Behavior During Therapy  [facility] for tasks assessed/performed       Past Medical History:  Diagnosis Date  . Anxiety   . Aortic heart valve narrowing   . Atrial fibrillation (HCC)   . Diabetes mellitus without complication (HCC)   . Disorder of aorta (HCC)   . Hyperlipidemia   . Hypertension   . Insomnia   . Overactive bladder     Past Surgical History:  Procedure Laterality Date  . APPENDECTOMY    . rotator cuff surgery Left     There were no vitals filed for this visit.  Subjective Assessment - 03/21/20 0752    Subjective  Patient states she has been practicing every day this past week. Patient reports no dizziness episodes.    Pertinent History  Patient reports she first started getting a little dizziness last year but that it got worse after she fell in the kitchen striking the left side of her head on the wall. Patient reports the dizziness is a "whole lot better this week", but states for a little over 2 weeks every time she lays down she gets vertigo and states that she feels her eyes are going backwards up in her head. Patient reports the vertigo lasts minutes. Patient reports lying down brings on her symptoms and states nothing makes it better.    Patient Stated Goals  to have decreased dizziness       Neuromuscular  Re-education: Slow Marching: Patient performed on firm surface and then standing on Airex pad, slow marching 10 reps each leg with CGA.  Patient improved with practice.   Airex pad:  On firm surface and then on Airex pad, patient performed feet together progressions (progressed to feet together) and semi-tandem progressions with alternating lead leg with and without body turns and horizontal and vertical head turns.  Patient required CGA with activities and patient occasionally would touch bar for support, but largely was able to perform without UEs support this date.  Step Taps: On firm surface, patient performed alternating step taps on to Airex pad 10 reps each leg with CGA.  On firm surface, patient performed alternating step taps on 6" wooden step.  Patient required occasional brief one hand assist on bar for support due to loss of balance.  Repeated activity while standing on Airex pad 10 reps each leg but required light touch of one hand on bar for support.  Ambulation:  Patient performed about 100' ambulation with SPC with S. Patient demonstrated good sequencing after initial cuing.    Cone tapping: On firm surface, patient performed foot tapping to cones in series of one and two as called out by therapist. Patient occasionally touching // bar for support due to losses of  balance. Patient requiring CGA except patient had one episode of loss of balance requiring Mod A to correct.   Patient reports compliance with home exercise program. Patient able to progress to performing feet together and semi-tandem progressions with head turns on Airex pad and slow marching on Airex pad this date. Patient challenged by cone tapping activities and continues to be challenged by uneven surfaces, single leg stance and activities with head turns. Plan on updating goals and repeating ABC and FOTO score next visit. Patient would benefit from continued PT services to address goals and deficits as set on  plan of care.     PT Education - 03/21/20 0753    Education Details  discussed recommended use of assistive device for mobility at this time. Patient reports she has a SPC and rolling walker that she can use.    Person(s) Educated  Patient    Methods  Explanation    Comprehension  Verbalized understanding       PT Short Term Goals - 02/27/20 1804      PT SHORT TERM GOAL #1   Title  Patient will report 50% or greater improvement in her symptoms of dizziness and imbalance with provoking motions or positions    Time  4    Period  Weeks    Status  New    Target Date  03/26/20        PT Long Term Goals - 02/27/20 1805      PT LONG TERM GOAL #1   Title  Patient will report 80% or greater improvement in her symptoms of dizziness and imbalance with provoking motions or positions    Time  8    Period  Weeks    Status  New    Target Date  04/23/20      PT LONG TERM GOAL #2   Title  Patient will reduce falls risk as indicated by Activities Specific Balance Confidence Scale (ABC) >67%.    Baseline  scored  63% on 02/27/20    Time  8    Period  Weeks    Status  New    Target Date  04/23/20      PT LONG TERM GOAL #3   Title  Patient will improve functional independence as evidenced by FOTO score of 65/100 or greater.    Baseline  scored  59/100 on 02/27/20    Time  8    Period  Weeks    Status  New    Target Date  04/23/20            Plan - 03/21/20 1128    Clinical Impression Statement  Patient reports compliance with home exercise program. Patient able to progress to performing feet together and semi-tandem progressions with head turns on Airex pad and slow marching on Airex pad this date. Patient challenged by cone tapping activities and continues to be challenged by uneven surfaces, single leg stance and activities with head turns. Plan on updating goals and repeating ABC and FOTO score next visit. Patient would benefit from continued PT services to address goals and  deficits as set on plan of care.    Personal Factors and Comorbidities  Comorbidity 3+    Comorbidities  anxiety, HTN, A-Fib, DM    Stability/Clinical Decision Making  Evolving/Moderate complexity    Rehab Potential  Good    PT Frequency  1x / week    PT Duration  8 weeks    PT Treatment/Interventions  Canalith  Repostioning;Gait training;Stair training;Functional mobility training;Therapeutic activities;Therapeutic exercise;Balance training;Neuromuscular re-education;Patient/family education;Vestibular    PT Next Visit Plan  repeat DH test and Epley maneuver if indicated    Consulted and Agree with Plan of Care  Patient       Patient will benefit from skilled therapeutic intervention in order to improve the following deficits and impairments:  Decreased balance, Dizziness, Decreased strength, Difficulty walking  Visit Diagnosis: Dizziness and giddiness  Unsteadiness on feet     Problem List Patient Active Problem List   Diagnosis Date Noted  . IFG (impaired fasting glucose) 02/21/2018  . GERD without esophagitis 02/21/2018  . Advanced care planning/counseling discussion 08/09/2017  . Heart failure ([place]) 10/25/2016  . Hypercholesterolemia 12/22/2015  . Essential hypertension 12/22/2015  . Depression 12/22/2015  . Chronic atrial fibrillation ([place]) 12/22/2015  . Arthritis, senescent 12/22/2015  . Anemia 12/22/2015  . Insomnia 12/22/2015  . Overactive bladder 12/22/2015   Lady Deutscher PT, DPT [phone]  Lady Deutscher 03/21/2020, 11:38 AM  [place] [facility] [address] [place], [place], [address] Phone: [phone]   Fax:  [phone]  Name: Gabryela Kimbrell MRN: [id number] Date of Birth: 1928/08/25

## 2020-03-28 ENCOUNTER — Other Ambulatory Visit: Payer: Self-pay

## 2020-03-28 ENCOUNTER — Encounter: Payer: Self-pay | Admitting: Physical Therapy

## 2020-03-28 ENCOUNTER — Ambulatory Visit: Payer: Medicare Other | Admitting: Physical Therapy

## 2020-03-28 DIAGNOSIS — R42 Dizziness and giddiness: Secondary | ICD-10-CM | POA: Diagnosis not present

## 2020-03-28 DIAGNOSIS — R2681 Unsteadiness on feet: Secondary | ICD-10-CM | POA: Diagnosis not present

## 2020-03-28 NOTE — Therapy (Signed)
[facility] [address] [place], [place], [address] Phone: [phone]   Fax:  [phone]  Physical Therapy Treatment  Patient Details  Name: Melinda Preston MRN: [id number] Date of Birth: 1928/05/25 Referring Provider (PT): Gabriel Cirri, NP   Encounter Date: 03/28/2020  PT End of Session - 03/28/20 0758    Visit Number  5    Number of Visits  9    Date for PT Re-Evaluation  04/23/20    PT Start Time  0758    PT Stop Time  0845    PT Time Calculation (min)  47 min    Equipment Utilized During Treatment  Gait belt    Activity Tolerance  Patient tolerated treatment well    Behavior During Therapy  [facility] for tasks assessed/performed       Past Medical History:  Diagnosis Date  . Anxiety   . Aortic heart valve narrowing   . Atrial fibrillation (HCC)   . Diabetes mellitus without complication (HCC)   . Disorder of aorta (HCC)   . Hyperlipidemia   . Hypertension   . Insomnia   . Overactive bladder     Past Surgical History:  Procedure Laterality Date  . APPENDECTOMY    . rotator cuff surgery Left     There were no vitals filed for this visit.  Subjective Assessment - 03/28/20 0758    Subjective  Patient reports that she has been doing good and that she has been doing her exercises without difficulty. Patient reports no further episodes of dizziness. Patient states that she has started to use the [facility] when she goes out of the house.    Pertinent History  Patient reports she first started getting a little dizziness last year but that it got worse after she fell in the kitchen striking the left side of her head on the wall. Patient reports the dizziness is a "whole lot better this week", but states for a little over 2 weeks every time she lays down she gets vertigo and states that she feels her eyes are going backwards up in her head. Patient reports the vertigo lasts minutes. Patient reports lying down brings on her  symptoms and states nothing makes it better.    Patient Stated Goals  to have decreased dizziness       Neuromuscular Re-education:  Patient arrived to clinic ambulating with SPC. The cane was one notch too high so readjusted cane. Patient ambulated 150' with SPC demonstrating good sequencing. Encouraged patient to continue to use [facility] for mobility.  Performed retro ambulation with head turns scanning for targets in the hallway while using SPC with CGA. Patient with slow stepping but without veering.  Cone tapping: In standing on Airex pad, patient performed foot tapping to cones in series of one, two and three cones as called out by therapist with CGA. Patient with multiple small losses of balance where she needed to reach // bars for support for a few seconds.  Patient had more difficulty with standing on right leg as compared to left leg.   Airex balance beam: On Airex balance beam, performed sideways stance static holds with horizontal head turns.  On Airex balance beam, performed sideways stepping with horizontal head turns 5' times 4 reps. On Airex balance beam, performed tandem static standing with and without vertical head turns multiple 30 second holds.  On Airex balance beam, performed tandem walking 5' times 4 reps. Patient required CGA with above activities.  Step Taps: On Airex pad, patient performed alternating step taps on 6" wooden step 15 reps each leg.  Sidestepping: Patient performed sidestepping on Airex pad, onto 6" wooden step and over to solid floor and back. Patient performed 10 reps with CGA. Patient had increased difficulty with stepping up leading with the right leg as compared to left leg.   Ball circles: Worked on alternating step taps on ball while standing on firm surface 10 reps each leg. Attempted ball circles but this was too challenging this date. Worked on placing foot on top of ball and moving ball forward/backward with alternating foot.    Patient arrived to clinic using [facility] and demonstrates good sequencing with the cane. Encouraged patient to continue to use SPC with mobility. Patient demonstrated improvement with single leg stance activities this date as she was able to progress from 1 and 2 cone tapping to series of 2 to 3 cones, but this is still challenging for patient. Patient had difficulty with sidestepping onto and over 6" wooden box. Patient would benefit from continued PT services to further address functional deficits.    PT Education - 03/28/20 1015    Education Details  discussed use of cane and adjusted cane to proper height; discussed plan of care and progress towards dizziness goals    Person(s) Educated  Patient    Methods  Explanation    Comprehension  Verbalized understanding       PT Short Term Goals - 03/28/20 1016      PT SHORT TERM GOAL #1   Title  Patient will report 50% or greater improvement in her symptoms of dizziness and imbalance with provoking motions or positions    Baseline  pt reports 100% improvement in her symptoms of dizziness    Time  4    Period  Weeks    Status  Achieved    Target Date  03/26/20        PT Long Term Goals - 03/28/20 1016      PT LONG TERM GOAL #1   Title  Patient will report 80% or greater improvement in her symptoms of dizziness and imbalance with provoking motions or positions    Baseline  patient reports 100% improvement in her symptoms of dizziness    Time  8    Period  Weeks    Status  Achieved      PT LONG TERM GOAL #2   Title  Patient will reduce falls risk as indicated by Activities Specific Balance Confidence Scale (ABC) >67%.    Baseline  scored  63% on 02/27/20    Time  8    Period  Weeks    Status  On-going      PT LONG TERM GOAL #3   Title  Patient will improve functional independence as evidenced by FOTO score of 65/100 or greater.    Baseline  scored  59/100 on 02/27/20    Time  8    Period  Weeks    Status  On-going             Plan - 03/28/20 1029    Clinical Impression Statement  Patient arrived to clinic using [facility] and demonstrates good sequencing with the cane. Encouraged patient to continue to use SPC with mobility. Patient demonstrated improvement with single leg stance activities this date as she was able to progress from 1 and 2 cone tapping to series of 2 to 3 cones, but this is still challenging for patient.  Patient had difficulty with sidestepping onto and over 6" wooden box. Patient would benefit from continued PT services to further address functional deficits.    Personal Factors and Comorbidities  Comorbidity 3+    Comorbidities  anxiety, HTN, A-Fib, DM    Stability/Clinical Decision Making  Evolving/Moderate complexity    Rehab Potential  Good    PT Frequency  1x / week    PT Duration  8 weeks    PT Treatment/Interventions  Canalith Repostioning;Gait training;Stair training;Functional mobility training;Therapeutic activities;Therapeutic exercise;Balance training;Neuromuscular re-education;Patient/family education;Vestibular    PT Next Visit Plan  repeat DH test and Epley maneuver if indicated    Consulted and Agree with Plan of Care  Patient       Patient will benefit from skilled therapeutic intervention in order to improve the following deficits and impairments:  Decreased balance, Dizziness, Decreased strength, Difficulty walking  Visit Diagnosis: Dizziness and giddiness  Unsteadiness on feet     Problem List Patient Active Problem List   Diagnosis Date Noted  . IFG (impaired fasting glucose) 02/21/2018  . GERD without esophagitis 02/21/2018  . Advanced care planning/counseling discussion 08/09/2017  . Heart failure (HCC) 10/25/2016  . Hypercholesterolemia 12/22/2015  . Essential hypertension 12/22/2015  . Depression 12/22/2015  . Chronic atrial fibrillation (HCC) 12/22/2015  . Arthritis, senescent 12/22/2015  . Anemia 12/22/2015  . Insomnia 12/22/2015  . Overactive  bladder 12/22/2015   Mardelle Matte PT, DPT [phone] Mardelle Matte 03/28/2020, 10:29 AM  [place] [facility] [address] [place], [place], [address] Phone: [phone]   Fax:  [phone]  Name: Melinda Preston MRN: [id number] Date of Birth: 07-07-1928

## 2020-04-04 ENCOUNTER — Encounter: Payer: Self-pay | Admitting: Nurse Practitioner

## 2020-04-04 ENCOUNTER — Encounter: Payer: Self-pay | Admitting: Physical Therapy

## 2020-04-04 ENCOUNTER — Ambulatory Visit: Payer: Medicare Other | Admitting: Physical Therapy

## 2020-04-04 ENCOUNTER — Other Ambulatory Visit: Payer: Self-pay

## 2020-04-04 ENCOUNTER — Ambulatory Visit (INDEPENDENT_AMBULATORY_CARE_PROVIDER_SITE_OTHER): Payer: Medicare Other | Admitting: Nurse Practitioner

## 2020-04-04 ENCOUNTER — Emergency Department: Payer: Medicare Other

## 2020-04-04 ENCOUNTER — Telehealth: Payer: Self-pay | Admitting: Nurse Practitioner

## 2020-04-04 ENCOUNTER — Emergency Department
Admission: EM | Admit: 2020-04-04 | Discharge: 2020-04-04 | Disposition: A | Discharge status: Home / Self Care | Payer: Medicare Other | Attending: Emergency Medicine | Admitting: Emergency Medicine

## 2020-04-04 VITALS — BP 132/74 | HR 104 | Temp 98.0°F

## 2020-04-04 VITALS — BP 158/73 | HR 81

## 2020-04-04 DIAGNOSIS — W19XXXA Unspecified fall, initial encounter: Secondary | ICD-10-CM | POA: Insufficient documentation

## 2020-04-04 DIAGNOSIS — G44209 Tension-type headache, unspecified, not intractable: Secondary | ICD-10-CM

## 2020-04-04 DIAGNOSIS — I4891 Unspecified atrial fibrillation: Secondary | ICD-10-CM | POA: Diagnosis not present

## 2020-04-04 DIAGNOSIS — S199XXA Unspecified injury of neck, initial encounter: Secondary | ICD-10-CM | POA: Diagnosis not present

## 2020-04-04 DIAGNOSIS — S065X9A Traumatic subdural hemorrhage with loss of consciousness of unspecified duration, initial encounter: Secondary | ICD-10-CM | POA: Diagnosis not present

## 2020-04-04 DIAGNOSIS — Y939 Activity, unspecified: Secondary | ICD-10-CM | POA: Diagnosis not present

## 2020-04-04 DIAGNOSIS — Z79899 Other long term (current) drug therapy: Secondary | ICD-10-CM | POA: Diagnosis not present

## 2020-04-04 DIAGNOSIS — Y999 Unspecified external cause status: Secondary | ICD-10-CM | POA: Diagnosis not present

## 2020-04-04 DIAGNOSIS — Z7984 Long term (current) use of oral hypoglycemic drugs: Secondary | ICD-10-CM | POA: Diagnosis not present

## 2020-04-04 DIAGNOSIS — E119 Type 2 diabetes mellitus without complications: Secondary | ICD-10-CM | POA: Insufficient documentation

## 2020-04-04 DIAGNOSIS — Z7982 Long term (current) use of aspirin: Secondary | ICD-10-CM | POA: Insufficient documentation

## 2020-04-04 DIAGNOSIS — R42 Dizziness and giddiness: Secondary | ICD-10-CM

## 2020-04-04 DIAGNOSIS — Z7901 Long term (current) use of anticoagulants: Secondary | ICD-10-CM | POA: Insufficient documentation

## 2020-04-04 DIAGNOSIS — S0990XA Unspecified injury of head, initial encounter: Secondary | ICD-10-CM | POA: Diagnosis present

## 2020-04-04 DIAGNOSIS — R519 Headache, unspecified: Secondary | ICD-10-CM | POA: Diagnosis not present

## 2020-04-04 DIAGNOSIS — S065X0A Traumatic subdural hemorrhage without loss of consciousness, initial encounter: Secondary | ICD-10-CM | POA: Diagnosis not present

## 2020-04-04 DIAGNOSIS — R2681 Unsteadiness on feet: Secondary | ICD-10-CM | POA: Diagnosis not present

## 2020-04-04 DIAGNOSIS — I10 Essential (primary) hypertension: Secondary | ICD-10-CM | POA: Insufficient documentation

## 2020-04-04 DIAGNOSIS — I6200 Nontraumatic subdural hemorrhage, unspecified: Secondary | ICD-10-CM | POA: Diagnosis not present

## 2020-04-04 DIAGNOSIS — Y929 Unspecified place or not applicable: Secondary | ICD-10-CM | POA: Diagnosis not present

## 2020-04-04 DIAGNOSIS — R262 Difficulty in walking, not elsewhere classified: Secondary | ICD-10-CM | POA: Diagnosis not present

## 2020-04-04 DIAGNOSIS — Z9181 History of falling: Secondary | ICD-10-CM | POA: Diagnosis not present

## 2020-04-04 DIAGNOSIS — S065XAA Traumatic subdural hemorrhage with loss of consciousness status unknown, initial encounter: Secondary | ICD-10-CM

## 2020-04-04 LAB — CBC WITH DIFFERENTIAL/PLATELET
Abs Immature Granulocytes: 0.03 10*3/uL (ref 0.00–0.07)
Basophils Absolute: 0 10*3/uL (ref 0.0–0.1)
Basophils Relative: 1 %
Eosinophils Absolute: 0 10*3/uL (ref 0.0–0.5)
Eosinophils Relative: 0 %
HCT: 39.3 % (ref 36.0–46.0)
Hemoglobin: 12.5 g/dL (ref 12.0–15.0)
Immature Granulocytes: 0 %
Lymphocytes Relative: 16 %
Lymphs Abs: 1.2 10*3/uL (ref 0.7–4.0)
MCH: 25.5 pg — ABNORMAL LOW (ref 26.0–34.0)
MCHC: 31.8 g/dL (ref 30.0–36.0)
MCV: 80.2 fL (ref 80.0–100.0)
Monocytes Absolute: 0.6 10*3/uL (ref 0.1–1.0)
Monocytes Relative: 8 %
Neutro Abs: 5.6 10*3/uL (ref 1.7–7.7)
Neutrophils Relative %: 75 %
Platelets: 207 10*3/uL (ref 150–400)
RBC: 4.9 MIL/uL (ref 3.87–5.11)
RDW: 15.6 % — ABNORMAL HIGH (ref 11.5–15.5)
WBC: 7.5 10*3/uL (ref 4.0–10.5)
nRBC: 0 % (ref 0.0–0.2)

## 2020-04-04 LAB — BASIC METABOLIC PANEL
Anion gap: 8 (ref 5–15)
BUN: 18 mg/dL (ref 8–23)
CO2: 31 mmol/L (ref 22–32)
Calcium: 9.3 mg/dL (ref 8.9–10.3)
Chloride: 103 mmol/L (ref 98–111)
Creatinine, Ser: 0.74 mg/dL (ref 0.44–1.00)
GFR calc Af Amer: >60 mL/min (ref >60)
GFR calc non Af Amer: >60 mL/min (ref >60)
Glucose, Bld: 124 mg/dL — ABNORMAL HIGH (ref 70–99)
Potassium: 4 mmol/L (ref 3.5–5.1)
Sodium: 142 mmol/L (ref 135–145)

## 2020-04-04 LAB — PROTIME-INR
INR: 1.3 — ABNORMAL HIGH (ref 0.8–1.2)
Prothrombin Time: 15.9 seconds — ABNORMAL HIGH (ref 11.4–15.2)

## 2020-04-04 LAB — APTT: aPTT: 36 seconds (ref 24–36)

## 2020-04-04 MED ORDER — CARVEDILOL 6.25 MG PO TABS
3.1250 mg | ORAL_TABLET | Freq: Two times a day (BID) | ORAL | Status: DC
  Administered 2020-04-04: 3.125 mg via ORAL
  Filled 2020-04-04: qty 1

## 2020-04-04 MED ORDER — PROTHROMBIN COMPLEX CONC HUMAN 500 UNITS IV KIT
3808.0000 [IU] | PACK | Status: AC
  Administered 2020-04-04: 3808 [IU] via INTRAVENOUS
  Filled 2020-04-04: qty 3000

## 2020-04-04 MED ORDER — PROTHROMBIN COMPLEX CONC HUMAN 500 UNITS IV KIT: 50.0000 [IU]/kg | PACK | Status: DC

## 2020-04-04 MED ORDER — TRIAMCINOLONE ACETONIDE 40 MG/ML IJ SUSP: 40.0000 mg | Freq: Once | INTRAMUSCULAR | Status: DC

## 2020-04-04 NOTE — Therapy (Signed)
[place] [facility] [address]. [place], [place], [address] Phone: [phone]   Fax:  [phone]  Physical Therapy Treatment  Patient Details  Name: Melinda Preston MRN: [id number] Date of Birth: Jul 22, 1928 Referring Provider (PT): Gabriel Cirri, NP   Encounter Date: 04/04/2020  PT End of Session - 04/04/20 0855    Visit Number  6    Number of Visits  9    Date for PT Re-Evaluation  04/23/20    PT Start Time  0754    PT Stop Time  0847    PT Time Calculation (min)  53 min    Equipment Utilized During Treatment  Gait belt    Activity Tolerance  Patient tolerated treatment well    Behavior During Therapy  [facility] for tasks assessed/performed       Past Medical History:  Diagnosis Date  . Anxiety   . Aortic heart valve narrowing   . Atrial fibrillation (HCC)   . Diabetes mellitus without complication (HCC)   . Disorder of aorta (HCC)   . Hyperlipidemia   . Hypertension   . Insomnia   . Overactive bladder     Past Surgical History:  Procedure Laterality Date  . APPENDECTOMY    . rotator cuff surgery Left     Vitals:   04/04/20 0822  BP: (!) 158/73  Pulse: 81  SpO2: 98%    Subjective Assessment - 04/04/20 0754    Subjective  Patient reports that she fell last Sunday in the bedroom when she turned quickly and fell. Patient states she hit her head when she fell. Patient reports she has been having a headache since the fall and states she has been taking it easy sitting and lying down since the fall. Patient has a bruise on her left wrist. Patient states she did not go to the doctor.  Discussed with patient that recommend patient call her primary care doctor this morning to report the fall and her symptoms and patient in agreement.  Inspected back of patient's head and skin was intact without bruising. Patient states she needs to slow down and states "I act like I am young and I am not". Patient reports she has not had any  dizziness since the fall.    Pertinent History  Patient reports she first started getting a little dizziness last year but that it got worse after she fell in the kitchen striking the left side of her head on the wall. Patient reports the dizziness is a "whole lot better this week", but states for a little over 2 weeks every time she lays down she gets vertigo and states that she feels her eyes are going backwards up in her head. Patient reports the vertigo lasts minutes. Patient reports lying down brings on her symptoms and states nothing makes it better.    Patient Stated Goals  to have decreased dizziness    Currently in Pain?  Yes    Pain Score  5    reports 5-6/10 pain   Pain Location  Head    Pain Orientation  Left;Posterior    Pain Descriptors / Indicators  Headache    Pain Type  Acute pain    Pain Onset  In the past 7 days      Patient reports she was not dizzy at the time of her fall at 11 am Sunday morning. Patient does not think that she lost consciousness, but her fall was unwitnessed. Patient reports that  she has had a headache since the fall. Patient reports that she took 2 Tylenol yesterday which helped to ease her headache. Patient states her headache she feels has improved. Discussed with patient that recommend patient call her primary care doctor this morning to report the fall and her symptoms and patient in agreement.    Neuromuscular Re-education: Ocular alignment was normal. Ocular ROM was normal. Noted gaze evoked nystagmus which could be age appropriate and was present at initial evaluation and normal smooth pursuits.  Mini-squats: Patient performed standing on Airex pad 2 sets 15 reps mini-squats with 5 second holds with verbal cues to maintain upright posture with CGA without UEs support.  Patient legs began to fatigue towards end or reps.   Step Taps: On Airex pad surface, patient performed alternating step taps on 6" wooden step 20 reps each foot with CGA.  Step  Ups: Patient performed on Airex pad step up, over and backward return on 6" wooden step without upper extremity support multiple reps, approximately 15 reps times two sets with CGA/Min A.  Patient performed 2 sets of 15 reps sideways step up over and return without upper extremity support on Airex pad stepping up to 6" box, over to another Airex pad and return.  Patient had one loss of balance needing Min A and several small losses of balance where patient would press back of her right writs against // bar for a few seconds to regain balance.   Dix-Hallpike: Performed left and right Dix-Hallpike tests per patient request and both were negative with patient denying vertigo and no nystagmus observed.   Tandem walking: Patient performed tandem walking 8' times 6 reps with CGA. Patient initially touching // bar with back of right arm for balance several times but improved with practice.          PT Education - 04/04/20 1127    Education Details  recommended that patient call her primary care physician this morning to report her fall and symptoms and patient in agreement; reviewed exercise technique and safety    Person(s) Educated  Patient    Methods  Explanation    Comprehension  Verbalized understanding       PT Short Term Goals - 03/28/20 1016      PT SHORT TERM GOAL #1   Title  Patient will report 50% or greater improvement in her symptoms of dizziness and imbalance with provoking motions or positions    Baseline  pt reports 100% improvement in her symptoms of dizziness    Time  4    Period  Weeks    Status  Achieved    Target Date  03/26/20        PT Long Term Goals - 03/28/20 1016      PT LONG TERM GOAL #1   Title  Patient will report 80% or greater improvement in her symptoms of dizziness and imbalance with provoking motions or positions    Baseline  patient reports 100% improvement in her symptoms of dizziness    Time  8    Period  Weeks    Status  Achieved      PT  LONG TERM GOAL #2   Title  Patient will reduce falls risk as indicated by Activities Specific Balance Confidence Scale (ABC) >67%.    Baseline  scored  63% on 02/27/20    Time  8    Period  Weeks    Status  On-going      PT LONG  TERM GOAL #3   Title  Patient will improve functional independence as evidenced by FOTO score of 65/100 or greater.    Baseline  scored  59/100 on 02/27/20    Time  8    Period  Weeks    Status  On-going            Plan - 04/04/20 1128    Clinical Impression Statement  Patient reports that she had a fall on Sunday when she was turning. Patient reports she was not dizzy at the time of the fall. Recommended that patient call her primary care physician to report her fall and her symtpoms. Patient in agreement to contact PCP. Patient was challenged by step up and over exercise on 6" wooden step and Airex pad as well as by mini squats standing on Airex pad. Patient would benefit from continued PT services to try to reduce patient's falls risk, address functional deficits including strength and balance deficits.    Personal Factors and Comorbidities  Comorbidity 3+    Comorbidities  anxiety, HTN, A-Fib, DM    Stability/Clinical Decision Making  Evolving/Moderate complexity    Rehab Potential  Good    PT Frequency  1x / week    PT Duration  8 weeks    PT Treatment/Interventions  Canalith Repostioning;Gait training;Stair training;Functional mobility training;Therapeutic activities;Therapeutic exercise;Balance training;Neuromuscular re-education;Patient/family education;Vestibular    PT Next Visit Plan  repeat DH test and Epley maneuver if indicated    Consulted and Agree with Plan of Care  Patient       Patient will benefit from skilled therapeutic intervention in order to improve the following deficits and impairments:  Decreased balance, Dizziness, Decreased strength, Difficulty walking  Visit Diagnosis: Dizziness and giddiness  Unsteadiness on  feet     Problem List Patient Active Problem List   Diagnosis Date Noted  . Headache 04/04/2020  . Fall 04/04/2020  . IFG (impaired fasting glucose) 02/21/2018  . GERD without esophagitis 02/21/2018  . Advanced care planning/counseling discussion 08/09/2017  . Heart failure ([place]) 10/25/2016  . Hypercholesterolemia 12/22/2015  . Essential hypertension 12/22/2015  . Depression 12/22/2015  . Chronic atrial fibrillation ([place]) 12/22/2015  . Arthritis, senescent 12/22/2015  . Anemia 12/22/2015  . Insomnia 12/22/2015  . Overactive bladder 12/22/2015   Lady Deutscher PT, DPT [phone] Lady Deutscher 04/04/2020, 11:39 AM  [facility] [address] [place], [place], [address] Phone: [phone]   Fax:  [phone]  Name: Melinda Preston MRN: [id number] Date of Birth: 1928-09-16

## 2020-04-04 NOTE — Progress Notes (Signed)
BP 132/74   Pulse (!) 104   Temp 98 F (36.7 C) (Oral)   LMP  (LMP Unknown)   SpO2 95%    Subjective:    Patient ID: Melinda Preston, female    DOB: Aug 12, 1928, 86 y.o.   MRN: [id number]  HPI: Melinda Preston is a 86 y.o. female presenting with headache.  Chief Complaint  Patient presents with  . Fall    Patient states she fell 5 days ago. Hit the back of her head, arm and hip. Having headaches since.   Marland Kitchen Headache   Patient presents with a headache that began after a fall that happened on Sunday morning 03/30/2020 around 11 am.  She was coming out of her bedroom when the fall happened; she fell in the kitchen on her left side and hit the back of her head.  She did not lose consciousness.  She reports her hip is also hurting, but is improving.  She has been continuing to go to physical therapy that was recommended in the past for dizziness and reports the dizziness is much improved.    HEADACHE The headache has been pretty constant since the fall and Tylenol has provided some relief.  The headache pain started as severe on Monday and Tuesday and has slowly improved.  She has been taking Tylenol 650 mg every 8 hours and is wondering what else she can take for the headache.  Of note, patient does take Eliquis daily.  Her son has told her numerous times to go to the ED since the fall but she has not wanted to go. Duration: days Onset: sudden Severity: moderate Quality: headache Frequency: constant Location: back of head Headache duration: constant Radiation: no Time of day headache occurs: all day Alleviating factors: Tylenol Aggravating factors: nothing Headache status at time of visit: current headache Treatments attempted: Treatments attempted: Tylenol    Aura: no Nausea:  no Vomiting: no Photophobia:  no Phonophobia:  no Effect on social functioning:  no Confusion:  no Gait disturbance/ataxia:  no Behavioral changes:  no Fevers:  no  Allergies  Allergen  Reactions  . Lisinopril Cough   Outpatient Encounter Medications as of 04/04/2020  Medication Sig  . acetaminophen (TYLENOL) 650 MG CR tablet Take 650 mg by mouth every 8 (eight) hours as needed for pain.  Marland Kitchen apixaban (ELIQUIS) 2.5 MG TABS tablet Take 1 tablet (2.5 mg total) by mouth 2 (two) times daily.  Marland Kitchen aspirin 81 MG tablet Take 81 mg by mouth daily.  Marland Kitchen atorvastatin (LIPITOR) 20 MG tablet Take 1 tablet (20 mg total) by mouth at bedtime.  . Calcium Carb-Cholecalciferol (CALCIUM 600+D3 PO) Take by mouth.  . carvedilol (COREG) 3.125 MG tablet Take 1 tablet (3.125 mg total) by mouth 2 (two) times daily with a meal.  . diltiazem (CARDIZEM CD) 180 MG 24 hr capsule TAKE 1 CAPSULE(180 MG) BY MOUTH DAILY  . furosemide (LASIX) 40 MG tablet Take 40 mg by mouth 2 (two) times daily.  Marland Kitchen losartan (COZAAR) 25 MG tablet TAKE 1 TABLET(25 MG) BY MOUTH DAILY  . Omega-3 Fatty Acids (FISH OIL) 1000 MG CAPS Take 1,000 mg by mouth daily.  Marland Kitchen omeprazole (PRILOSEC) 20 MG capsule TAKE 1 CAPSULE(20 MG) BY MOUTH DAILY  . potassium chloride SA (KLOR-CON) 20 MEQ tablet TAKE 1 TABLET(20 MEQ) BY MOUTH DAILY  . sertraline (ZOLOFT) 25 MG tablet TAKE 1 TABLET(25 MG) BY MOUTH DAILY  . traZODone (DESYREL) 50 MG tablet TAKE 1 TABLET(50 MG) BY  MOUTH AT BEDTIME  . VITAMIN D, CHOLECALCIFEROL, PO Take 600 Int'l Units/day by mouth.   Facility-Administered Encounter Medications as of 04/04/2020  Medication  . triamcinolone acetonide (KENALOG-40) injection 40 mg   Patient Active Problem List   Diagnosis Date Noted  . Headache 04/04/2020  . Fall 04/04/2020  . IFG (impaired fasting glucose) 02/21/2018  . GERD without esophagitis 02/21/2018  . Advanced care planning/counseling discussion 08/09/2017  . Heart failure (HCC) 10/25/2016  . Hypercholesterolemia 12/22/2015  . Essential hypertension 12/22/2015  . Depression 12/22/2015  . Chronic atrial fibrillation (HCC) 12/22/2015  . Arthritis, senescent 12/22/2015  . Anemia  12/22/2015  . Insomnia 12/22/2015  . Overactive bladder 12/22/2015   Past Medical History:  Diagnosis Date  . Anxiety   . Aortic heart valve narrowing   . Atrial fibrillation (HCC)   . Diabetes mellitus without complication (HCC)   . Disorder of aorta (HCC)   . Hyperlipidemia   . Hypertension   . Insomnia   . Overactive bladder    Relevant past medical, surgical, family and social history reviewed and updated as indicated. Interim medical history since our last visit reviewed.  Review of Systems  Constitutional: Negative.  Negative for activity change, fatigue and fever.  Eyes: Negative.  Negative for photophobia and visual disturbance.  Gastrointestinal: Negative.  Negative for nausea and vomiting.  Musculoskeletal: Negative.  Negative for arthralgias, back pain, gait problem and myalgias.  Neurological: Positive for headaches. Negative for dizziness, weakness and light-headedness.  Psychiatric/Behavioral: Negative.  Negative for confusion, decreased concentration and sleep disturbance. The patient is not nervous/anxious.    Per HPI unless specifically indicated above    Objective:    BP 132/74   Pulse (!) 104   Temp 98 F (36.7 C) (Oral)   LMP  (LMP Unknown)   SpO2 95%   Wt Readings from Last 3 Encounters:  04/04/20 152 lb (68.9 kg)  02/18/20 165 lb 3.2 oz (74.9 kg)  06/05/19 167 lb (75.8 kg)    Physical Exam Vitals and nursing note reviewed.  Constitutional:      General: She is not in acute distress.    Appearance: She is well-developed. She is not toxic-appearing.  HENT:     Head: Normocephalic and atraumatic.     Mouth/Throat:     Mouth: Mucous membranes are moist.     Pharynx: Oropharynx is clear.  Eyes:     General: No scleral icterus.    Extraocular Movements: Extraocular movements intact.     Right eye: Normal extraocular motion and no nystagmus.     Left eye: Normal extraocular motion and no nystagmus.     Pupils: Pupils are equal, round, and  reactive to light. Pupils are equal.  Cardiovascular:     Rate and Rhythm: Tachycardia present. Rhythm irregular.  Pulmonary:     Effort: Pulmonary effort is normal. No respiratory distress.  Musculoskeletal:        General: No swelling or tenderness. Normal range of motion.     Cervical back: Normal range of motion and neck supple. No rigidity.  Lymphadenopathy:     Cervical: No cervical adenopathy.  Skin:    General: Skin is warm and dry.     Capillary Refill: Capillary refill takes less than 2 seconds.     Coloration: Skin is not cyanotic or pale.  Neurological:     Mental Status: She is alert and oriented to person, place, and time.     Cranial Nerves: No cranial nerve deficit  or facial asymmetry.     Coordination: Romberg sign negative.     Gait: Gait normal.  Psychiatric:        Mood and Affect: Mood normal. Mood is not anxious or depressed.        Speech: Speech normal.        Behavior: Behavior normal.       Assessment & Plan:   Problem List Items Addressed This Visit      Other   Headache - Primary    Acute, ongoing since fall on Sunday.  Pain controlled with Tylenol, okay to continue Tylenol and IM Kenalog given in office today.  Educated that these medication does not have blood thinning properties.  Discussed potential life-threatening effects of falling and hitting head while being on a blood thinner such as stroke or intracranial bleed.  Worried about bleed with ongoing headache since fall.  Strongly encouraged patient to seek emergency care immediately to obtain imaging of head and neck to rule out a brain bleed.  Patient declined emergency transportation and stated she would call her son to drive her to the ER.  Reiterated that she should not drive until evaluated by an emergency provider.       Relevant Medications   triamcinolone acetonide (KENALOG-40) injection 40 mg   Fall    Discussed potential life-threatening effects of falling and hitting head while being  on a blood thinner such as stroke or intracranial bleed.  Strongly encouraged patient to seek emergency care immediately to obtain imaging of head and neck to rule out a brain bleed.  Patient declined emergency transportation and stated she would call her son to drive her to the ER.  Reiterated that she should not drive until evaluated by an emergency provider.           Follow up plan: Return for as scheduled.

## 2020-04-04 NOTE — Patient Instructions (Signed)

## 2020-04-04 NOTE — Discharge Instructions (Addendum)
Your CT scan was as below.  We recommended going over to [facility] but you prefer to stay here.  Luckily your repeat CT head was stable.  We recommend you hold your blood thinner called Eliquis for the next 2 weeks.  You can restart it after that.  We have given you neurosurgery's number to call to make a follow-up appointment in the next 1 to 2 weeks.  You can take Tylenol 1 g every 8 hours to help with pain.  You should return the ER immediately if you develop worsening pain, confusion or any other concerns.   Thin acute subdural hemorrhage along the left cerebral convexity extending along the tentorium. There is no significant mass effect. Nonemergent/chronic findings detailed above.

## 2020-04-04 NOTE — ED Provider Notes (Signed)
[facility] Emergency Department Provider Note  ____________________________________________   First MD Initiated Contact with Patient 04/04/20 1216     (approximate)  I have reviewed the triage vital signs and the nursing notes.   HISTORY  Chief Complaint Fall    HPI Melinda Preston is a 86 y.o. female with HF Ef 30-35% A. fib on Eliquis who comes in for fall.  Patient fell Sunday, 5 days ago.  Patient reports having a headache since then.  Patient was seen by her primary doctor this morning.  Patient got Tylenol and IM Kenalog in the office today.  Patient states that her headache is much better.  Patient states that her headache is mild to moderate, the middle of her head, better with Tylenol, nothing makes it worse.  Patient still able to ambulate.  States that she supposed to use a walker but when she had the fall she was was not using the walker and she stated that she just had a mechanical fall.  No chest pain, shortness of breath.  Patient states that she has been ambulatory since then and been feeling her normal self other than the mild headaches.          Past Medical History:  Diagnosis Date  . Anxiety   . Aortic heart valve narrowing   . Atrial fibrillation (HCC)   . Diabetes mellitus without complication (HCC)   . Disorder of aorta (HCC)   . Hyperlipidemia   . Hypertension   . Insomnia   . Overactive bladder     Patient Active Problem List   Diagnosis Date Noted  . Headache 04/04/2020  . Fall 04/04/2020  . IFG (impaired fasting glucose) 02/21/2018  . GERD without esophagitis 02/21/2018  . Advanced care planning/counseling discussion 08/09/2017  . Heart failure (HCC) 10/25/2016  . Hypercholesterolemia 12/22/2015  . Essential hypertension 12/22/2015  . Depression 12/22/2015  . Chronic atrial fibrillation (HCC) 12/22/2015  . Arthritis, senescent 12/22/2015  . Anemia 12/22/2015  . Insomnia 12/22/2015  . Overactive bladder  12/22/2015    Past Surgical History:  Procedure Laterality Date  . APPENDECTOMY    . rotator cuff surgery Left     Prior to Admission medications   Medication Sig Start Date End Date Taking? Authorizing Provider  acetaminophen (TYLENOL) 650 MG CR tablet Take 650 mg by mouth every 8 (eight) hours as needed for pain.    [provider]  apixaban (ELIQUIS) 2.5 MG TABS tablet Take 1 tablet (2.5 mg total) by mouth 2 (two) times daily. 09/05/19   Aura Dials T, NP  aspirin 81 MG tablet Take 81 mg by mouth daily.    [provider]  atorvastatin (LIPITOR) 20 MG tablet Take 1 tablet (20 mg total) by mouth at bedtime. 12/03/19   Aura Dials T, NP  Calcium Carb-Cholecalciferol (CALCIUM 600+D3 PO) Take by mouth.    [provider]  carvedilol (COREG) 3.125 MG tablet Take 1 tablet (3.125 mg total) by mouth 2 (two) times daily with a meal. 02/21/18   Gabriel Cirri, NP  diltiazem (CARDIZEM CD) 180 MG 24 hr capsule TAKE 1 CAPSULE(180 MG) BY MOUTH DAILY 02/17/20   [name], Corrie Dandy T, NP  furosemide (LASIX) 40 MG tablet Take 40 mg by mouth 2 (two) times daily.    [provider]  losartan (COZAAR) 25 MG tablet TAKE 1 TABLET(25 MG) BY MOUTH DAILY 03/16/20   [name], Corrie Dandy T, NP  Omega-3 Fatty Acids (FISH OIL) 1000 MG CAPS Take  1,000 mg by mouth daily.    [provider]  omeprazole (PRILOSEC) 20 MG capsule TAKE 1 CAPSULE(20 MG) BY MOUTH DAILY 02/17/20   [name], Jolene T, NP  potassium chloride SA (KLOR-CON) 20 MEQ tablet TAKE 1 TABLET(20 MEQ) BY MOUTH DAILY 09/24/19   [name], Jolene T, NP  sertraline (ZOLOFT) 25 MG tablet TAKE 1 TABLET(25 MG) BY MOUTH DAILY 01/08/20   [name], Jolene T, NP  traZODone (DESYREL) 50 MG tablet TAKE 1 TABLET(50 MG) BY MOUTH AT BEDTIME 02/17/20   [name], Jolene T, NP  VITAMIN D, CHOLECALCIFEROL, PO Take 600 Int'l Units/day by mouth.    [provider]    Allergies Lisinopril  Family History  Problem Relation Age of Onset    . Heart disease Father   . Heart disease Brother   . Diabetes Son   . Breast cancer Sister     Social History Social History   Tobacco Use  . Smoking status: Never Smoker  . Smokeless tobacco: Never Used  Substance Use Topics  . Alcohol use: No  . Drug use: No      Review of Systems Constitutional: No fever/chills, fall Eyes: No visual changes. ENT: No sore throat. Cardiovascular: Denies chest pain. Respiratory: Denies shortness of breath. Gastrointestinal: No abdominal pain.  No nausea, no vomiting.  No diarrhea.  No constipation. Genitourinary: Negative for dysuria. Musculoskeletal: Negative for back pain. Skin: Negative for rash. Neurological: Positive headache, no focal weakness or numbness. All other ROS negative ____________________________________________   PHYSICAL EXAM:  VITAL SIGNS: ED Triage Vitals  Enc Vitals Group     BP 04/04/20 1051 (!) 154/102     Pulse Rate 04/04/20 1051 98     Resp 04/04/20 1051 18     Temp 04/04/20 1051 98.2 F (36.8 C)     Temp Source 04/04/20 1051 Oral     SpO2 04/04/20 1051 97 %     Weight 04/04/20 1052 152 lb (68.9 kg)     Height 04/04/20 1052 5\' 1"  (1.549 m)     Head Circumference --      Peak Flow --      Pain Score 04/04/20 1051 3     Pain Loc --      Pain Edu? --      Excl. in GC? --     Constitutional: Alert and oriented. GCS 15  Eyes: Conjunctivae are normal. EOMI. Head: Atraumatic. Nose: No congestion/rhinnorhea. Mouth/Throat: Mucous membranes are moist.   Neck: No stridor. Trachea Midline. FROM.  No C-spine tenderness Cardiovascular: Normal rate, regular rhythm. Grossly normal heart sounds.  Good peripheral circulation. No chest wall tenderness Respiratory: Normal respiratory effort.  No retractions. Lungs CTAB. Gastrointestinal: Soft and nontender. No distention. No abdominal bruits.  Musculoskeletal:   RUE: No point tenderness, deformity or other signs of injury. Radial pulse intact. Neuro intact.  Full ROM in joint. LUE: Small old bruise no point tenderness, deformity or other signs of injury. Radial pulse intact. Neuro intact. Full ROM in joints RLE: No point tenderness, deformity or other signs of injury. DP pulse intact. Neuro intact. Full ROM in joints. LLE: No point tenderness, deformity or other signs of injury. DP pulse intact. Neuro intact. Full ROM in joints. Neurologic:  Normal speech and language. No gross focal neurologic deficits are appreciated.  Skin:  Skin is warm, dry and intact. No rash noted. Psychiatric: Mood and affect are normal. Speech and behavior are normal. GU: Deferred   ____________________________________________   LABS (all labs  ordered are listed, but only abnormal results are displayed)  Labs Reviewed  CBC WITH DIFFERENTIAL/PLATELET - Abnormal; Notable for the following components:      Result Value   MCH 25.5 (*)    RDW 15.6 (*)    All other components within normal limits  BASIC METABOLIC PANEL - Abnormal; Notable for the following components:   Glucose, Bld 124 (*)    All other components within normal limits  PROTIME-INR - Abnormal; Notable for the following components:   Prothrombin Time 15.9 (*)    INR 1.3 (*)    All other components within normal limits  APTT   ____________________________________________   ED ECG REPORT I, Concha Se, the attending physician, personally viewed and interpreted this ECG.  EKG is A. fib rate of 83, no ST elevations, T wave inversion in aVL, left bundle branch block--no recent priors to compare to ____________________________________________  RADIOLOGY I, Concha Se, personally viewed and evaluated these images (plain radiographs) as part of my medical decision making, as well as reviewing the written report by the radiologist.  ED MD interpretation:  Small subdural   Official radiology report(s): CT Head Wo Contrast  Result Date: 04/04/2020 CLINICAL DATA:  Follow up subdural hematoma. EXAM:  CT HEAD WITHOUT CONTRAST TECHNIQUE: Contiguous axial images were obtained from the base of the skull through the vertex without intravenous contrast. COMPARISON:  CT head earlier today. FINDINGS: This study was performed at 1719 hours. Brain: The acute subdural hematoma extending along the left cerebral convexity and tentorium is unchanged, measuring up to 7 mm in thickness. No new areas of intracranial hemorrhage. No midline shift, hydrocephalus or brain edema. Stable mild atrophy and probable chronic small vessel ischemic changes in the periventricular white matter. There is no CT evidence of acute cortical infarction. Vascular: Intracranial vascular calcifications. No hyperdense vessel identified. Skull: Negative for fracture or focal lesion. Sinuses/Orbits: The visualized paranasal sinuses and mastoid air cells are clear. No orbital abnormalities are seen. Other: None. IMPRESSION: 1. Stable acute subdural hematoma extending along the left cerebral convexity and tentorium. 2. No new areas of intracranial hemorrhage. No midline shift, hydrocephalus or brain edema. Electronically Signed   By: Carey Bullocks M.D.   On: 04/04/2020 17:35   CT Head Wo Contrast  Result Date: 04/04/2020 CLINICAL DATA:  Recent fall, headache EXAM: CT HEAD WITHOUT CONTRAST TECHNIQUE: Contiguous axial images were obtained from the base of the skull through the vertex without intravenous contrast. COMPARISON:  None. FINDINGS: Brain: There is acute subdural hemorrhage along the left cerebral convexity measuring up to 6 mm in thickness. This extends along the left tentorial leaflet. There is no significant mass effect. Gray-white differentiation is preserved. There is no extra-axial fluid collection. Patchy hypoattenuation in the supratentorial white matter is nonspecific but may reflect mild to moderate chronic microvascular ischemic changes. Prominence of the ventricles and sulci reflects minor generalized parenchymal volume loss.  Vascular: There is atherosclerotic calcification at the skull base. Skull: Calvarium is unremarkable. Sinuses/Orbits: No acute finding. Other: None. IMPRESSION: Thin acute subdural hemorrhage along the left cerebral convexity extending along the tentorium. There is no significant mass effect. Nonemergent/chronic findings detailed above. These results were called by telephone at the time of interpretation on 04/04/2020 at 11:23 am to provider Dr. Scotty Court, who verbally acknowledged these results. Electronically Signed   By: Guadlupe Spanish M.D.   On: 04/04/2020 11:40   CT Cervical Spine Wo Contrast  Result Date: 04/04/2020 CLINICAL DATA:  Fall EXAM: CT  CERVICAL SPINE WITHOUT CONTRAST TECHNIQUE: Multidetector CT imaging of the cervical spine was performed without intravenous contrast. Multiplanar CT image reconstructions were also generated. COMPARISON:  None. FINDINGS: Alignment: Trace degenerative listhesis. Skull base and vertebrae: No acute cervical spine fracture. Vertebral body heights are preserved apart from degenerative endplate irregularity. Soft tissues and spinal canal: No prevertebral fluid or swelling. No visible canal hematoma. Disc levels: Multilevel degenerative changes are present including disc space narrowing, endplate osteophytes, and facet and uncovertebral hypertrophy. There is no high-grade osseous encroachment on the spinal canal. Upper chest: Negative. Other: None. IMPRESSION: No acute cervical spine fracture. Electronically Signed   By: Guadlupe SpanishPraneil  Patel M.D.   On: 04/04/2020 11:29    ____________________________________________   PROCEDURES  Procedure(s) performed (including Critical Care):  .Critical Care Performed by: Concha SeFunke, Karolina Zamor E, MD Authorized by: Concha SeFunke, Eugenia Eldredge E, MD   Critical care provider statement:    Critical care time (minutes):  35   Critical care was necessary to treat or prevent imminent or life-threatening deterioration of the following conditions:  CNS failure  or compromise   Critical care was time spent personally by me on the following activities:  Discussions with consultants, evaluation of patient's response to treatment, examination of patient, ordering and performing treatments and interventions, ordering and review of laboratory studies, ordering and review of radiographic studies, pulse oximetry, re-evaluation of patient's condition, obtaining history from patient or surrogate and review of old charts     ____________________________________________   INITIAL IMPRESSION / ASSESSMENT AND PLAN / ED COURSE   Quentin OreBlanche Ector Borghi was evaluated in Emergency Department on 04/04/2020 for the symptoms described in the history of present illness. She was evaluated in the context of the global COVID-19 pandemic, which necessitated consideration that the patient might be at risk for infection with the SARS-CoV-2 virus that causes COVID-19. Institutional protocols and algorithms that pertain to the evaluation of patients at risk for COVID-19 are in a state of rapid change based on information released by regulatory bodies including the CDC and federal and state organizations. These policies and algorithms were followed during the patient's care in the ED.    Patient comes in with a fall on blood thinner.  Will get CT head evaluate for intracranial hemorrhage, CT cervical evaluate for cervical fracture.  Patient has no other chest wall tenderness, abdominal tenderness or extremity tenderness to suggest any other injuries.  Will get EKG and basic labs given CT scan is concerning for small subdural hematoma discussed with neurosurgery.  Discussed with Dr. Clyde LundborgBarr [facility] who wanted patient to be transferred over ED ED for further evaluation.  Unfortunate I discussed this with patient she is adamant that she is refusing transport.  She understands that her bleed could get worse and that we do not have a neurosurgeon here that was to happen.  However she is not  wanting to be transferred.  She has capacity to make this decision.  She really would like to go home but will compromise at least and agreeable to staying for repeat CT head here  D/w Dr. Teola BradleyBarr who said pt should hold eliquis 2 weeks and have reversal if we have it here.  I discussed with pharmacy for reversal of her Eliquis.  Patient states that she last took it this morning.  Discussed with pharmacy, Dahlia ClientHannah who stated that we did not have enough andexa to give.  We did have Kcentra which was the old reversal agent.  Again discussed with patient stating that another  reason for pt to go to [facility] would be that they would have the adnexa and explained how this was a better reversal agent for the Eliquis.  Patient still adamant that she does not want to go over today.  Patient is willing to restart the Kcentra.  I did confirm dosing with pharmacy.  Patient is alert and oriented  Attempted to call son but he did not answer and there was no answering machine for me to leave message.   Patient's son was later at bedside I did update him.  He is aware that patient was offered transfer to [facility] but declined.  Patient is remained neuro intact the entire time.  Repeat CT head shows stable acute hematoma.  I attempted to call the [facility] transfer center to let Dr. Aris Lot know but reports that he was involved in a trauma was not able to take the call.  However due to our prior discussion it sounds like patient would be stable to go home given the CT is stable continues to have a normal neuro exam and we will hold her Eliquis for 2 weeks.  I did discuss with both the son and the patient about holding the blood thinner and there is a risk of stroke given her Eliquis but the risk of bleeding is higher at this time.  The expressed understanding.  We will give her some follow-up with [facility] neurosurgery at the [facility] clinic.  They understand that if she develops worsening headache or changes in her mental status that she needs  to return to the ER immediately for reevaluation because the bleed could always get worse later on however patient is adamant that she would like to go home and feels normal at this time  I discussed the provisional nature of ED diagnosis, the treatment so far, the ongoing plan of care, follow up appointments and return precautions with the patient and any family or support people present. They expressed understanding and agreed with the plan, discharged home.   ____________________________________________   FINAL CLINICAL IMPRESSION(S) / ED DIAGNOSES   Final diagnoses:  Subdural hematoma ([place])  Fall, initial encounter      MEDICATIONS GIVEN DURING THIS VISIT:  Medications  carvedilol (COREG) tablet 3.125 mg (3.125 mg Oral Given 04/04/20 1500)  prothrombin complex conc human (KCENTRA) IVPB 3,808 Units (0 Units Intravenous Stopped 04/04/20 1625)     ED Discharge Orders    None       Note:  This document was prepared using Dragon voice recognition software and may include unintentional dictation errors.   Vanessa Greenwater, MD 04/04/20 [phone]

## 2020-04-04 NOTE — Assessment & Plan Note (Signed)
Discussed potential life-threatening effects of falling and hitting head while being on a blood thinner such as stroke or intracranial bleed.  Strongly encouraged patient to seek emergency care immediately to obtain imaging of head and neck to rule out a brain bleed.  Patient declined emergency transportation and stated she would call her son to drive her to the ER.  Reiterated that she should not drive until evaluated by an emergency provider.

## 2020-04-04 NOTE — ED Triage Notes (Signed)
Pt reports fall on Sunday, mechanical fall. States headache since, sent for CT head. Pt alert and oriented X4, cooperative, RR even and unlabored, color WNL. Pt in NAD.

## 2020-04-04 NOTE — Telephone Encounter (Signed)
erroneous error  

## 2020-04-04 NOTE — Assessment & Plan Note (Addendum)
Acute, ongoing since fall on Sunday.  Pain controlled with Tylenol, okay to continue Tylenol and IM Kenalog given in office today.  Educated that these medication does not have blood thinning properties.  Discussed potential life-threatening effects of falling and hitting head while being on a blood thinner such as stroke or intracranial bleed.  Worried about bleed with ongoing headache since fall.  Strongly encouraged patient to seek emergency care immediately to obtain imaging of head and neck to rule out a brain bleed.  Patient declined emergency transportation and stated she would call her son to drive her to the ER.  Reiterated that she should not drive until evaluated by an emergency provider.

## 2020-04-07 ENCOUNTER — Telehealth: Payer: Self-pay | Admitting: Nurse Practitioner

## 2020-04-07 NOTE — Telephone Encounter (Signed)
Unable to lvm due to voicemail not set up to make this f.u  apt.

## 2020-04-07 NOTE — Telephone Encounter (Signed)
Noted, thank you

## 2020-04-07 NOTE — Telephone Encounter (Signed)
-----   Message from Marjie Skiff, NP sent at 04/06/2020  5:28 PM EDT ----- Needs ER follow-up this week, I only have a 3:15 on Friday open, can do this if patient able or see if Shanda Bumps has opening as she recently saw her for episode -- if patient unable to do 3:15 on Friday.  Thank you.

## 2020-04-07 NOTE — Telephone Encounter (Signed)
Pt stated she did not want to schedule apt at this time and would call back to make this apt.

## 2020-04-09 DIAGNOSIS — Z7901 Long term (current) use of anticoagulants: Secondary | ICD-10-CM | POA: Diagnosis not present

## 2020-04-09 DIAGNOSIS — R7989 Other specified abnormal findings of blood chemistry: Secondary | ICD-10-CM | POA: Diagnosis not present

## 2020-04-09 DIAGNOSIS — W01198D Fall on same level from slipping, tripping and stumbling with subsequent striking against other object, subsequent encounter: Secondary | ICD-10-CM | POA: Diagnosis not present

## 2020-04-09 DIAGNOSIS — I110 Hypertensive heart disease with heart failure: Secondary | ICD-10-CM | POA: Diagnosis not present

## 2020-04-09 DIAGNOSIS — M25552 Pain in left hip: Secondary | ICD-10-CM | POA: Diagnosis not present

## 2020-04-09 DIAGNOSIS — Z5181 Encounter for therapeutic drug level monitoring: Secondary | ICD-10-CM | POA: Diagnosis not present

## 2020-04-09 DIAGNOSIS — R296 Repeated falls: Secondary | ICD-10-CM | POA: Diagnosis not present

## 2020-04-09 DIAGNOSIS — Y929 Unspecified place or not applicable: Secondary | ICD-10-CM | POA: Diagnosis not present

## 2020-04-09 DIAGNOSIS — Z79899 Other long term (current) drug therapy: Secondary | ICD-10-CM | POA: Diagnosis not present

## 2020-04-09 DIAGNOSIS — S065X0D Traumatic subdural hemorrhage without loss of consciousness, subsequent encounter: Secondary | ICD-10-CM | POA: Diagnosis not present

## 2020-04-09 DIAGNOSIS — S065X0A Traumatic subdural hemorrhage without loss of consciousness, initial encounter: Secondary | ICD-10-CM | POA: Diagnosis not present

## 2020-04-09 DIAGNOSIS — S065X9A Traumatic subdural hemorrhage with loss of consciousness of unspecified duration, initial encounter: Secondary | ICD-10-CM | POA: Diagnosis not present

## 2020-04-09 DIAGNOSIS — I5022 Chronic systolic (congestive) heart failure: Secondary | ICD-10-CM | POA: Diagnosis not present

## 2020-04-09 DIAGNOSIS — I6200 Nontraumatic subdural hemorrhage, unspecified: Secondary | ICD-10-CM | POA: Diagnosis not present

## 2020-04-11 ENCOUNTER — Ambulatory Visit: Payer: Medicare Other

## 2020-04-15 ENCOUNTER — Encounter: Payer: Self-pay | Admitting: *Deleted

## 2020-04-15 ENCOUNTER — Other Ambulatory Visit: Payer: Self-pay

## 2020-04-15 ENCOUNTER — Emergency Department: Payer: Medicare Other

## 2020-04-15 DIAGNOSIS — R42 Dizziness and giddiness: Secondary | ICD-10-CM | POA: Insufficient documentation

## 2020-04-15 DIAGNOSIS — Z803 Family history of malignant neoplasm of breast: Secondary | ICD-10-CM | POA: Diagnosis not present

## 2020-04-15 DIAGNOSIS — I5022 Chronic systolic (congestive) heart failure: Secondary | ICD-10-CM | POA: Diagnosis not present

## 2020-04-15 DIAGNOSIS — N3281 Overactive bladder: Secondary | ICD-10-CM | POA: Insufficient documentation

## 2020-04-15 DIAGNOSIS — S065X0A Traumatic subdural hemorrhage without loss of consciousness, initial encounter: Secondary | ICD-10-CM | POA: Diagnosis not present

## 2020-04-15 DIAGNOSIS — Z888 Allergy status to other drugs, medicaments and biological substances status: Secondary | ICD-10-CM | POA: Insufficient documentation

## 2020-04-15 DIAGNOSIS — F419 Anxiety disorder, unspecified: Secondary | ICD-10-CM | POA: Diagnosis not present

## 2020-04-15 DIAGNOSIS — W19XXXS Unspecified fall, sequela: Secondary | ICD-10-CM | POA: Diagnosis not present

## 2020-04-15 DIAGNOSIS — K219 Gastro-esophageal reflux disease without esophagitis: Secondary | ICD-10-CM | POA: Diagnosis not present

## 2020-04-15 DIAGNOSIS — S065X0S Traumatic subdural hemorrhage without loss of consciousness, sequela: Secondary | ICD-10-CM | POA: Diagnosis not present

## 2020-04-15 DIAGNOSIS — G4700 Insomnia, unspecified: Secondary | ICD-10-CM | POA: Insufficient documentation

## 2020-04-15 DIAGNOSIS — R4789 Other speech disturbances: Secondary | ICD-10-CM | POA: Diagnosis not present

## 2020-04-15 DIAGNOSIS — R519 Headache, unspecified: Secondary | ICD-10-CM | POA: Insufficient documentation

## 2020-04-15 DIAGNOSIS — I482 Chronic atrial fibrillation, unspecified: Secondary | ICD-10-CM | POA: Diagnosis not present

## 2020-04-15 DIAGNOSIS — I110 Hypertensive heart disease with heart failure: Secondary | ICD-10-CM | POA: Insufficient documentation

## 2020-04-15 DIAGNOSIS — R29810 Facial weakness: Secondary | ICD-10-CM | POA: Diagnosis not present

## 2020-04-15 DIAGNOSIS — I639 Cerebral infarction, unspecified: Secondary | ICD-10-CM | POA: Diagnosis present

## 2020-04-15 DIAGNOSIS — Z7982 Long term (current) use of aspirin: Secondary | ICD-10-CM | POA: Diagnosis not present

## 2020-04-15 DIAGNOSIS — E1151 Type 2 diabetes mellitus with diabetic peripheral angiopathy without gangrene: Secondary | ICD-10-CM | POA: Diagnosis not present

## 2020-04-15 DIAGNOSIS — Z79899 Other long term (current) drug therapy: Secondary | ICD-10-CM | POA: Diagnosis not present

## 2020-04-15 DIAGNOSIS — F329 Major depressive disorder, single episode, unspecified: Secondary | ICD-10-CM | POA: Insufficient documentation

## 2020-04-15 DIAGNOSIS — R4701 Aphasia: Secondary | ICD-10-CM | POA: Diagnosis not present

## 2020-04-15 DIAGNOSIS — Z7901 Long term (current) use of anticoagulants: Secondary | ICD-10-CM | POA: Insufficient documentation

## 2020-04-15 DIAGNOSIS — I6200 Nontraumatic subdural hemorrhage, unspecified: Secondary | ICD-10-CM | POA: Diagnosis not present

## 2020-04-15 DIAGNOSIS — I4891 Unspecified atrial fibrillation: Secondary | ICD-10-CM | POA: Diagnosis not present

## 2020-04-15 DIAGNOSIS — Z20822 Contact with and (suspected) exposure to covid-19: Secondary | ICD-10-CM | POA: Insufficient documentation

## 2020-04-15 DIAGNOSIS — E7800 Pure hypercholesterolemia, unspecified: Secondary | ICD-10-CM | POA: Insufficient documentation

## 2020-04-15 DIAGNOSIS — Z791 Long term (current) use of non-steroidal anti-inflammatories (NSAID): Secondary | ICD-10-CM | POA: Diagnosis not present

## 2020-04-15 DIAGNOSIS — Z8249 Family history of ischemic heart disease and other diseases of the circulatory system: Secondary | ICD-10-CM | POA: Diagnosis not present

## 2020-04-15 DIAGNOSIS — Z833 Family history of diabetes mellitus: Secondary | ICD-10-CM | POA: Insufficient documentation

## 2020-04-15 DIAGNOSIS — I10 Essential (primary) hypertension: Secondary | ICD-10-CM | POA: Diagnosis not present

## 2020-04-15 DIAGNOSIS — E785 Hyperlipidemia, unspecified: Secondary | ICD-10-CM | POA: Diagnosis not present

## 2020-04-15 LAB — BASIC METABOLIC PANEL
Anion gap: 12 (ref 5–15)
BUN: 22 mg/dL (ref 8–23)
CO2: 27 mmol/L (ref 22–32)
Calcium: 9.3 mg/dL (ref 8.9–10.3)
Chloride: 99 mmol/L (ref 98–111)
Creatinine, Ser: 0.89 mg/dL (ref 0.44–1.00)
GFR calc Af Amer: >60 mL/min (ref >60)
GFR calc non Af Amer: 57 mL/min — ABNORMAL LOW (ref >60)
Glucose, Bld: 137 mg/dL — ABNORMAL HIGH (ref 70–99)
Potassium: 3.6 mmol/L (ref 3.5–5.1)
Sodium: 138 mmol/L (ref 135–145)

## 2020-04-15 LAB — CBC WITH DIFFERENTIAL/PLATELET
Abs Immature Granulocytes: 0.05 10*3/uL (ref 0.00–0.07)
Basophils Absolute: 0 10*3/uL (ref 0.0–0.1)
Basophils Relative: 0 %
Eosinophils Absolute: 0 10*3/uL (ref 0.0–0.5)
Eosinophils Relative: 0 %
HCT: 43.7 % (ref 36.0–46.0)
Hemoglobin: 14.2 g/dL (ref 12.0–15.0)
Immature Granulocytes: 1 %
Lymphocytes Relative: 16 %
Lymphs Abs: 1.6 10*3/uL (ref 0.7–4.0)
MCH: 25.7 pg — ABNORMAL LOW (ref 26.0–34.0)
MCHC: 32.5 g/dL (ref 30.0–36.0)
MCV: 79.2 fL — ABNORMAL LOW (ref 80.0–100.0)
Monocytes Absolute: 0.7 10*3/uL (ref 0.1–1.0)
Monocytes Relative: 7 %
Neutro Abs: 7.4 10*3/uL (ref 1.7–7.7)
Neutrophils Relative %: 76 %
Platelets: 338 10*3/uL (ref 150–400)
RBC: 5.52 MIL/uL — ABNORMAL HIGH (ref 3.87–5.11)
RDW: 14.8 % (ref 11.5–15.5)
WBC: 9.8 10*3/uL (ref 4.0–10.5)
nRBC: 0 % (ref 0.0–0.2)

## 2020-04-15 LAB — TROPONIN I (HIGH SENSITIVITY): Troponin I (High Sensitivity): 11 ng/L (ref <18)

## 2020-04-15 LAB — PROTIME-INR
INR: 1.1 (ref 0.8–1.2)
Prothrombin Time: 13.7 seconds (ref 11.4–15.2)

## 2020-04-15 LAB — APTT: aPTT: 34 seconds (ref 24–36)

## 2020-04-15 NOTE — ED Triage Notes (Signed)
Pt to ED reporting new speech changes over the past two days. Pt was seen in ED and dx with a subdural hematoma on 04/04/20. Pt then went to [facility] ED on the 04/09/20 and had another CT scan that showed no changes in subdural but pt went due to persistent  headache. Pt has a follow up with neurosurgery in a week.   Pt is not slurring speech but is stuttering and having trouble finding her words. Pt also report dizziness and lightheadedness intermittently today with numbness to her right hand.

## 2020-04-16 ENCOUNTER — Observation Stay: Payer: Medicare Other

## 2020-04-16 ENCOUNTER — Observation Stay
Admission: EM | Admit: 2020-04-16 | Discharge: 2020-04-17 | Disposition: A | Discharge status: Home / Self Care | Payer: Medicare Other | Attending: Internal Medicine | Admitting: Internal Medicine

## 2020-04-16 ENCOUNTER — Emergency Department: Payer: Medicare Other

## 2020-04-16 DIAGNOSIS — I6521 Occlusion and stenosis of right carotid artery: Secondary | ICD-10-CM | POA: Diagnosis not present

## 2020-04-16 DIAGNOSIS — K219 Gastro-esophageal reflux disease without esophagitis: Secondary | ICD-10-CM | POA: Diagnosis present

## 2020-04-16 DIAGNOSIS — E7800 Pure hypercholesterolemia, unspecified: Secondary | ICD-10-CM | POA: Diagnosis present

## 2020-04-16 DIAGNOSIS — I482 Chronic atrial fibrillation, unspecified: Secondary | ICD-10-CM | POA: Diagnosis present

## 2020-04-16 DIAGNOSIS — F32A Depression, unspecified: Secondary | ICD-10-CM | POA: Diagnosis present

## 2020-04-16 DIAGNOSIS — I10 Essential (primary) hypertension: Secondary | ICD-10-CM | POA: Diagnosis present

## 2020-04-16 DIAGNOSIS — S065XAA Traumatic subdural hemorrhage with loss of consciousness status unknown, initial encounter: Secondary | ICD-10-CM

## 2020-04-16 DIAGNOSIS — I6200 Nontraumatic subdural hemorrhage, unspecified: Secondary | ICD-10-CM | POA: Diagnosis not present

## 2020-04-16 DIAGNOSIS — F325 Major depressive disorder, single episode, in full remission: Secondary | ICD-10-CM

## 2020-04-16 DIAGNOSIS — I639 Cerebral infarction, unspecified: Secondary | ICD-10-CM

## 2020-04-16 DIAGNOSIS — I5022 Chronic systolic (congestive) heart failure: Secondary | ICD-10-CM | POA: Diagnosis present

## 2020-04-16 DIAGNOSIS — Z8679 Personal history of other diseases of the circulatory system: Secondary | ICD-10-CM

## 2020-04-16 DIAGNOSIS — I6202 Nontraumatic subacute subdural hemorrhage: Secondary | ICD-10-CM | POA: Diagnosis not present

## 2020-04-16 DIAGNOSIS — R4701 Aphasia: Secondary | ICD-10-CM

## 2020-04-16 DIAGNOSIS — R29818 Other symptoms and signs involving the nervous system: Secondary | ICD-10-CM | POA: Diagnosis not present

## 2020-04-16 LAB — BRAIN NATRIURETIC PEPTIDE: B Natriuretic Peptide: 101.3 pg/mL — ABNORMAL HIGH (ref 0.0–100.0)

## 2020-04-16 LAB — SARS CORONAVIRUS 2 BY RT PCR (HOSPITAL ORDER, PERFORMED IN ~~LOC~~ HOSPITAL LAB): SARS Coronavirus 2: NEGATIVE

## 2020-04-16 MED ORDER — ACETAMINOPHEN 160 MG/5ML PO SOLN
650.0000 mg | ORAL | Status: DC | PRN
  Filled 2020-04-16: qty 20.3

## 2020-04-16 MED ORDER — LORAZEPAM 2 MG/ML IJ SOLN: 1.0000 mg | INTRAMUSCULAR | Status: DC | PRN

## 2020-04-16 MED ORDER — FUROSEMIDE 40 MG PO TABS
40.0000 mg | ORAL_TABLET | Freq: Two times a day (BID) | ORAL | Status: DC
  Administered 2020-04-17: 40 mg via ORAL
  Filled 2020-04-16: qty 1

## 2020-04-16 MED ORDER — LEVETIRACETAM IN NACL 500 MG/100ML IV SOLN
500.0000 mg | Freq: Two times a day (BID) | INTRAVENOUS | Status: DC
  Filled 2020-04-16 (×2): qty 100

## 2020-04-16 MED ORDER — LOSARTAN POTASSIUM 25 MG PO TABS
25.0000 mg | ORAL_TABLET | Freq: Every day | ORAL | Status: DC
  Administered 2020-04-17: 25 mg via ORAL
  Filled 2020-04-16: qty 1

## 2020-04-16 MED ORDER — OXYCODONE-ACETAMINOPHEN 5-325 MG PO TABS
1.0000 | ORAL_TABLET | Freq: Once | ORAL | Status: AC
  Administered 2020-04-16: 1 via ORAL
  Filled 2020-04-16: qty 1

## 2020-04-16 MED ORDER — ONDANSETRON HCL 4 MG/2ML IJ SOLN: 4.0000 mg | Freq: Three times a day (TID) | INTRAMUSCULAR | Status: DC | PRN

## 2020-04-16 MED ORDER — VITAMIN D 25 MCG (1000 UNIT) PO TABS
1000.0000 [IU] | ORAL_TABLET | Freq: Every day | ORAL | Status: DC
  Administered 2020-04-17: 1000 [IU] via ORAL
  Filled 2020-04-16: qty 1

## 2020-04-16 MED ORDER — SODIUM CHLORIDE 0.9 % IV SOLN: INTRAVENOUS | Status: DC

## 2020-04-16 MED ORDER — ACETAMINOPHEN 325 MG PO TABS
650.0000 mg | ORAL_TABLET | ORAL | Status: DC | PRN
  Administered 2020-04-17: 650 mg via ORAL
  Filled 2020-04-16: qty 2

## 2020-04-16 MED ORDER — CALCIUM CARBONATE-VITAMIN D 500-200 MG-UNIT PO TABS
1.0000 | ORAL_TABLET | Freq: Every day | ORAL | Status: DC
  Administered 2020-04-17: 1 via ORAL
  Filled 2020-04-16: qty 1

## 2020-04-16 MED ORDER — HYDRALAZINE HCL 20 MG/ML IJ SOLN: 5.0000 mg | INTRAMUSCULAR | Status: DC | PRN

## 2020-04-16 MED ORDER — ATORVASTATIN CALCIUM 20 MG PO TABS
20.0000 mg | ORAL_TABLET | Freq: Every day | ORAL | Status: DC
  Administered 2020-04-16: 20 mg via ORAL
  Filled 2020-04-16: qty 1

## 2020-04-16 MED ORDER — CARVEDILOL 3.125 MG PO TABS
3.1250 mg | ORAL_TABLET | Freq: Two times a day (BID) | ORAL | Status: DC
  Administered 2020-04-17: 3.125 mg via ORAL
  Filled 2020-04-16: qty 1

## 2020-04-16 MED ORDER — DILTIAZEM HCL ER COATED BEADS 180 MG PO CP24
180.0000 mg | ORAL_CAPSULE | Freq: Every day | ORAL | Status: DC
  Administered 2020-04-17: 180 mg via ORAL
  Filled 2020-04-16: qty 1

## 2020-04-16 MED ORDER — SERTRALINE HCL 50 MG PO TABS
25.0000 mg | ORAL_TABLET | Freq: Every day | ORAL | Status: DC
  Administered 2020-04-17: 25 mg via ORAL
  Filled 2020-04-16: qty 1

## 2020-04-16 MED ORDER — PANTOPRAZOLE SODIUM 40 MG PO TBEC
40.0000 mg | DELAYED_RELEASE_TABLET | Freq: Every day | ORAL | Status: DC
  Administered 2020-04-17: 40 mg via ORAL
  Filled 2020-04-16: qty 1

## 2020-04-16 MED ORDER — LEVETIRACETAM 500 MG PO TABS
500.0000 mg | ORAL_TABLET | Freq: Two times a day (BID) | ORAL | Status: DC
  Administered 2020-04-16 – 2020-04-17 (×3): 500 mg via ORAL
  Filled 2020-04-16 (×3): qty 1

## 2020-04-16 MED ORDER — ACETAMINOPHEN 650 MG RE SUPP: 650.0000 mg | RECTAL | Status: DC | PRN

## 2020-04-16 MED ORDER — OMEGA-3-ACID ETHYL ESTERS 1 G PO CAPS
1.0000 g | ORAL_CAPSULE | Freq: Every day | ORAL | Status: DC
  Administered 2020-04-17: 1 g via ORAL
  Filled 2020-04-16: qty 1

## 2020-04-16 MED ORDER — TRAZODONE HCL 50 MG PO TABS
50.0000 mg | ORAL_TABLET | Freq: Every day | ORAL | Status: DC
  Administered 2020-04-16: 50 mg via ORAL
  Filled 2020-04-16: qty 1

## 2020-04-16 MED ORDER — STROKE: EARLY STAGES OF RECOVERY BOOK: Freq: Once | Status: DC

## 2020-04-16 MED ORDER — DEXAMETHASONE 1 MG/ML PO CONC
1.0000 mg | Freq: Two times a day (BID) | ORAL | Status: DC
  Administered 2020-04-16 – 2020-04-17 (×3): 1 mg via ORAL
  Filled 2020-04-16 (×6): qty 1

## 2020-04-16 NOTE — Progress Notes (Signed)
eeg completed ° °

## 2020-04-16 NOTE — Procedures (Signed)
Patient Name: Melinda Preston  MRN: [id number]  Epilepsy Attending: Charlsie Quest  Referring Physician/Provider: Dr Lindajo Royal Date: 04/16/2020 Duration: 26.28 mins  Patient history: 86 y.o. female with medical history significant ofrecent SDH due to fall  who presents with difficulty speaking, right hand numbness and dizziness. EEG to evaluate for seizure  Level of alertness: Awake  AEDs during EEG study: Keppra  Technical aspects: This EEG study was done with scalp electrodes positioned according to the 10-20 International system of electrode placement. Electrical activity was acquired at a sampling rate of 500Hz  and reviewed with a high frequency filter of 70Hz  and a low frequency filter of 1Hz . EEG data were recorded continuously and digitally stored.   Description: The posterior dominant rhythm consists of 8-9 Hz activity of moderate voltage (25-35 uV) seen predominantly in posterior head regions, asymmetric ( L<R) and reactive to eye opening and eye closing. EEG showed continuous 3 to 6 Hz theta-delta slowing in left hemisphere, maximal left frontotemporal region. Hyperventilation and photic stimulation were not performed.     ABNORMALITY -Continuous slow, lateralized left, maximal  left frontotemporal region -Background asymmetry, left <right   IMPRESSION: This study is suggestive of cortical dysfunction in left hemisphere, maximal left frontotemporal region likely secondary to underlying SDH. No seizures or epileptiform discharges were seen throughout the recording.  Melinda Preston 

## 2020-04-16 NOTE — ED Notes (Addendum)
Pt's daughter-in-law, Olegario Messier, at bedside. This RN provided with her number to call and update as needed. [phone].

## 2020-04-16 NOTE — H&P (Signed)
History and Physical    Melinda Preston WUJ:[id number] DOB: [birth year] DOA: 04/16/2020  Referring MD/NP/PA:   PCP: Marjie Skiffannady, Jolene T, NP   Patient coming from:  The patient is coming from home.  At baseline, pt is independent for most of ADL.        Chief Complaint: Difficulty speaking, right hand numbness, dizziness, headache  HPI: Melinda OreBlanche Ector Kracht is a 86 y.o. female with medical history significant of recent SDH due to fall, hypertension, hyperlipidemia, diet-controlled diabetes, GERD, depression, anxiety, atrial fibrillation (Eliquis on hold), sCHF with EF of 35%, who presents with difficulty speaking, right hand numbness and dizziness.  er her daughter-in-law (I called her daughter in-law  by phone), pt had fall which resulted in a subdural hematoma recently. This was found by CT [facility]. She was treated with Kcentra and discharged home with Eliquis on Hold. She [facility] for persistent headache and the subdural was found to be stable. Since last night, patient dveloped difficulty speaking, difficulty finding words and stuttering. She also had some numbness in her right hand earlier when she first got up this morning. She thinks that was because she was lying on it because it went away shortly after she woke up. She has a persistent headache that she has had for weeks, located mainly in the front left part of head.  No unilateral weakness in extremities.  No vision loss or hearing loss.  Patient does not have chest pain, shortness breath, cough.  No fever or chills.  No nausea vomiting, diarrhea, abdominal pain, symptoms of UTI.   ED Course: pt was found to have negative COVID-19 PCR, electrolytes renal function okay, temperature normal, blood pressure 148/98, heart rate 58, oxygen saturation 97% on room air. Pt is place on progressive for observation, neurosurgeon, Dr. Myer HaffYarbrough and Dr. Loretha BrasilZeylikman for neurology are consulted.  CT-head showed: 1.  Since the CT of 04/04/2020, minimal increase in subdural collection along the left cerebral convexity. This is currently hypoattenuating, consistent with evolving blood products. No significant mass effect or midline shift. 2. No evidence of other acute superimposed process.  MARI of brain showed: 1. 8 mm subacute left holo hemispheric subdural hematoma with mild mass effect on the subjacent left cerebral hemisphere without significant midline shift. 2. Additional trace 2 mm subacute right frontotemporal subdural hematoma without associated mass effect. 3. Underlying age-related cerebral atrophy with moderate chronic small vessel ischemic disease. No other acute intracranial abnormality.  Review of Systems:   General: no fevers, chills, no body weight gain, has fatigue HEENT: no blurry vision, hearing changes or sore throat Respiratory: no dyspnea, coughing, wheezing CV: no chest pain, no palpitations GI: no nausea, vomiting, abdominal pain, diarrhea, constipation GU: no dysuria, burning on urination, increased urinary frequency, hematuria  Ext: no leg edema Neuro: no vision change or hearing loss. Has difficulty speaking, right hand numbness, dizziness, headache Skin: no rash, no skin tear. MSK: No muscle spasm, no deformity, no limitation of range of movement in spin Heme: No easy bruising.  Travel history: No recent long distant travel.  Allergy:  Allergies  Allergen Reactions  . Lisinopril Cough    Past Medical History:  Diagnosis Date  . Anxiety   . Aortic heart valve narrowing   . Atrial fibrillation (HCC)   . Diabetes mellitus without complication (HCC)   . Disorder of aorta (HCC)   . Hyperlipidemia   . Hypertension   . Insomnia   . Overactive  bladder       Social History:  reports that she has never smoked. She has never used smokeless tobacco. She reports that she does not drink alcohol or use drugs.  Family History:  Family History  Problem Relation Age of  Onset  . Heart disease Father   . Heart disease Brother   . Diabetes Son   . Breast cancer Sister      Prior to Admission medications   Medication Sig Start Date End Date Taking? Authorizing Provider  acetaminophen (TYLENOL) 650 MG CR tablet Take 650 mg by mouth every 8 (eight) hours as needed for pain.   Yes [provider]  apixaban (ELIQUIS) 2.5 MG TABS tablet Take 1 tablet (2.5 mg total) by mouth 2 (two) times daily. 09/05/19  Yes [name], Corrie Dandy T, NP  aspirin 81 MG tablet Take 81 mg by mouth daily.   Yes [provider]  atorvastatin (LIPITOR) 20 MG tablet Take 1 tablet (20 mg total) by mouth at bedtime. 12/03/19  Yes [name], Corrie Dandy T, NP  Calcium Carb-Cholecalciferol (CALCIUM 600+D3 PO) Take 1 tablet by mouth daily.    Yes [provider]  carvedilol (COREG) 3.125 MG tablet Take 1 tablet (3.125 mg total) by mouth 2 (two) times daily with a meal. 02/21/18  Yes Gabriel Cirri, NP  diltiazem (CARDIZEM CD) 180 MG 24 hr capsule TAKE 1 CAPSULE(180 MG) BY MOUTH DAILY Patient taking differently: Take 180 mg by mouth daily.  02/17/20  Yes [name], Jolene T, NP  furosemide (LASIX) 40 MG tablet Take 40 mg by mouth 2 (two) times daily.   Yes [provider]  losartan (COZAAR) 25 MG tablet TAKE 1 TABLET(25 MG) BY MOUTH DAILY Patient taking differently: Take 25 mg by mouth daily.  03/16/20  Yes [name], Jolene T, NP  Omega-3 Fatty Acids (FISH OIL) 1000 MG CAPS Take 1,000 mg by mouth daily.   Yes [provider]  omeprazole (PRILOSEC) 20 MG capsule TAKE 1 CAPSULE(20 MG) BY MOUTH DAILY Patient taking differently: Take 20 mg by mouth daily.  02/17/20  Yes [name], Jolene T, NP  potassium chloride SA (KLOR-CON) 20 MEQ tablet TAKE 1 TABLET(20 MEQ) BY MOUTH DAILY Patient taking differently: Take 20 mEq by mouth daily.  09/24/19  Yes [name], Jolene T, NP  sertraline (ZOLOFT) 25 MG tablet TAKE 1 TABLET(25 MG) BY MOUTH DAILY Patient taking differently: Take 25 mg by  mouth daily.  01/08/20  Yes [name], Jolene T, NP  traZODone (DESYREL) 50 MG tablet TAKE 1 TABLET(50 MG) BY MOUTH AT BEDTIME Patient taking differently: Take 50 mg by mouth at bedtime. TAKE 1 TABLET(50 MG) BY MOUTH AT BEDTIME 02/17/20  Yes [name], Jolene T, NP  VITAMIN D, CHOLECALCIFEROL, PO Take 600 Units by mouth daily.    Yes [provider]    Physical Exam: Vitals:   04/15/20 2001 04/15/20 2002 04/16/20 0800 04/16/20 0830  BP: (!) 148/98  (!) 155/89 (!) 142/87  Pulse: (!) 56  82 78  Resp: 16  17 18   Temp: 98.1 F (36.7 C)     TempSrc: Oral     SpO2: 97%  98% 96%  Weight:  68.9 kg    Height:  5\' 1"  (1.549 m)     General: Not in acute distress HEENT:       Eyes: PERRL, EOMI, no scleral icterus.       ENT: No discharge from the ears and nose, no pharynx injection, no tonsillar enlargement.  Neck: No JVD, no bruit, no mass felt. Heme: No neck lymph node enlargement. Cardiac: S1/S2, RRR, No murmurs, No gallops or rubs. Respiratory: No rales, wheezing, rhonchi or rubs. GI: Soft, nondistended, nontender, no rebound pain, no organomegaly, BS present. GU: No hematuria Ext: No pitting leg edema bilaterally. 2+DP/PT pulse bilaterally. Musculoskeletal: No joint deformities, No joint redness or warmth, no limitation of ROM in spin. Skin: No rashes.  Neuro: Alert, oriented X3, cranial nerves II-XII grossly intact, muscle strength 5/5 in all extremities, sensation to light touch intact. Brachial reflex 2+ bilaterally. Negative Babinski's sign. Psych: Patient is not psychotic, no suicidal or hemocidal ideation.  Labs on Admission: I have personally reviewed following labs and imaging studies  CBC: Recent Labs  Lab 04/15/20 2009  WBC 9.8  NEUTROABS 7.4  HGB 14.2  HCT 43.7  MCV 79.2*  PLT 338   Basic Metabolic Panel: Recent Labs  Lab 04/15/20 2009  NA 138  K 3.6  CL 99  CO2 27  GLUCOSE 137*  BUN 22  CREATININE 0.89  CALCIUM 9.3   GFR: Estimated Creatinine  Clearance: 38 mL/min (by C-G formula based on SCr of 0.89 mg/dL). Liver Function Tests: No results for input(s): AST, ALT, ALKPHOS, BILITOT, PROT, ALBUMIN in the last 168 hours. No results for input(s): LIPASE, AMYLASE in the last 168 hours. No results for input(s): AMMONIA in the last 168 hours. Coagulation Profile: Recent Labs  Lab 04/15/20 2031  INR 1.1   Cardiac Enzymes: No results for input(s): CKTOTAL, CKMB, CKMBINDEX, TROPONINI in the last 168 hours. BNP (last 3 results) No results for input(s): PROBNP in the last 8760 hours. HbA1C: No results for input(s): HGBA1C in the last 72 hours. CBG: No results for input(s): GLUCAP in the last 168 hours. Lipid Profile: No results for input(s): CHOL, HDL, LDLCALC, TRIG, CHOLHDL, LDLDIRECT in the last 72 hours. Thyroid Function Tests: No results for input(s): TSH, T4TOTAL, FREET4, T3FREE, THYROIDAB in the last 72 hours. Anemia Panel: No results for input(s): VITAMINB12, FOLATE, FERRITIN, TIBC, IRON, RETICCTPCT in the last 72 hours. Urine analysis: No results found for: COLORURINE, APPEARANCEUR, LABSPEC, PHURINE, GLUCOSEU, HGBUR, BILIRUBINUR, KETONESUR, PROTEINUR, UROBILINOGEN, NITRITE, LEUKOCYTESUR Sepsis Labs: @LABRCNTIP (procalcitonin:4,lacticidven:4) ) Recent Results (from the past 240 hour(s))  SARS Coronavirus 2 by RT PCR (hospital order, performed in [facility] hospital lab) Nasopharyngeal Nasopharyngeal Swab     Status: None   Collection Time: 04/16/20  6:07 AM   Specimen: Nasopharyngeal Swab  Result Value Ref Range Status   SARS Coronavirus 2 NEGATIVE NEGATIVE Final    Comment: (NOTE) SARS-CoV-2 target nucleic acids are NOT DETECTED. The SARS-CoV-2 RNA is generally detectable in upper and lower respiratory specimens during the acute phase of infection. The lowest concentration of SARS-CoV-2 viral copies this assay can detect is 250 copies / mL. A negative result does not preclude SARS-CoV-2 infection and should not be  used as the sole basis for treatment or other patient management decisions.  A negative result may occur with improper specimen collection / handling, submission of specimen other than nasopharyngeal swab, presence of viral mutation(s) within the areas targeted by this assay, and inadequate number of viral copies (<250 copies / mL). A negative result must be combined with clinical observations, patient history, and epidemiological information. Fact Sheet for Patients:   06/16/20 Fact Sheet for Healthcare Providers: BoilerBrush.com.cy This test is not yet approved or cleared  by the https://pope.com/ FDA and has been authorized for detection and/or diagnosis of SARS-CoV-2 by FDA under  an Emergency Use Authorization (EUA).  This EUA will remain in effect (meaning this test can be used) for the duration of the COVID-19 declaration under Section 564(b)(1) of the Act, 21 U.S.C. section 360bbb-3(b)(1), unless the authorization is terminated or revoked sooner. Performed at [facility], [address] [place], [place] [address]      Radiological Exams on Admission: CT HEAD WO CONTRAST  Result Date: 04/15/2020 CLINICAL DATA:  New speech changes over the past 2 days. History of subdural hematoma. EXAM: CT HEAD WITHOUT CONTRAST TECHNIQUE: Contiguous axial images were obtained from the base of the skull through the vertex without intravenous contrast. COMPARISON:  04/04/2020 FINDINGS: Brain: Expected cerebral and cerebellar volume loss for age. The previously described left-sided subdural collection is again identified. Currently low-density, consistent with evolution of blood products. Maximally 6 mm on 13/3 anteriorly. On the order of 4 mm at the same level on the prior exam (when remeasured). No significant mass effect or midline shift. No acute infarct, intra-axial fluid collection, or hydrocephalus. Vascular: Intracranial  atherosclerosis. Skull: No significant soft tissue swelling.  No skull fracture. Sinuses/Orbits: Normal imaged portions of the orbits and globes. Clear paranasal sinuses and mastoid air cells. Other: None. IMPRESSION: 1. Since the CT of 04/04/2020, minimal increase in subdural collection along the left cerebral convexity. This is currently hypoattenuating, consistent with evolving blood products. No significant mass effect or midline shift. 2. No evidence of other acute superimposed process. Electronically Signed   By: Jeronimo Greaves M.D.   On: 04/15/2020 20:45   MR BRAIN WO CONTRAST  Result Date: 04/16/2020 CLINICAL DATA:  Initial evaluation for subdural hematoma. EXAM: MRI HEAD WITHOUT CONTRAST TECHNIQUE: Multiplanar, multiecho pulse sequences of the brain and surrounding structures were obtained without intravenous contrast. COMPARISON:  Prior head CT from 04/15/2020. FINDINGS: Brain: Generalized age-related cerebral atrophy. Patchy T2/FLAIR hyperintensity within the periventricular deep white matter both cerebral hemispheres most consistent with chronic small vessel ischemic disease, moderate in nature. No abnormal foci of restricted diffusion to suggest acute or subacute ischemia. Gray-white matter differentiation maintained. No encephalomalacia to suggest chronic cortical infarction. No acute intracranial hemorrhage within the brain itself. Few scattered chronic micro hemorrhages noted clustered about the splenium and deep gray nuclei, likely related to underlying hypertension. No mass lesion. Subacute left holo hemispheric subdural hematoma again seen, measuring up to 8 mm in maximal thickness at the level of the left frontal lobe. Mild mass effect on the subjacent left cerebral hemisphere without significant midline shift. Additional trace right-sided subdural hematoma is seen overlying the right frontotemporal convexity, measuring no more than 2 mm in maximal thickness without associated mass effect. This  is also subacute in appearance. No midline shift or hydrocephalus. Pituitary gland suprasellar region normal. Midline structures intact. Vascular: Major intracranial vascular flow voids are maintained. Skull and upper cervical spine: Craniocervical junction within normal limits. Upper cervical spine normal. Bone marrow signal intensity within normal limits. No scalp soft tissue abnormality. Sinuses/Orbits: Patient status post bilateral ocular lens replacement. Globes and orbital soft tissues demonstrate no acute finding. Tiny left maxillary sinus retention cyst noted. Paranasal sinuses otherwise largely clear. Trace left mastoid effusion noted. Inner ear structures grossly normal. Other: None. IMPRESSION: 1. 8 mm subacute left holo hemispheric subdural hematoma with mild mass effect on the subjacent left cerebral hemisphere without significant midline shift. 2. Additional trace 2 mm subacute right frontotemporal subdural hematoma without associated mass effect. 3. Underlying age-related cerebral atrophy with moderate chronic small vessel ischemic disease. No  other acute intracranial abnormality. Electronically Signed   By: Jeannine Boga M.D.   On: 04/16/2020 04:26     EKG: Independently reviewed.  Atrial fibrillation, QTc 464, LAD, poor R wave progression   Assessment/Plan Principal Problem:   Acute focal neurological deficit Active Problems:   Hypercholesterolemia   Essential hypertension   Depression   Chronic atrial fibrillation (HCC)   GERD without esophagitis   History of subdural hematoma 7/51/02   Chronic systolic CHF (congestive heart failure) ([place])   Acute focal neurological deficit: pt has difficulty speaking, right hand numbness and dizziness. MRI is negative for ischemic stroke, but showed MRI brain with L SDH of 8MM and 24mm R frontoparietal 56mm SDH. Dr. Izora Ribas of neurosurgery is consulted, he recommended starting patient with Keppra and Decadron.  Will treat the patient  with 500 mg of Keppra twice daily and 1 mg of Decadron twice daily in hospital, but the dose needs to be decreased to Keppra 250 mg twice daily and Decadron 0.5 mg twice daily for remainder of 2 weeks after discharge per neurosurgeon.  Dr. Irish Elders of neurology is consulted, who agreed to treat patient with Keppra.  -Placed on progressive bed for observation -Keppra 500 mg twice daily -Decadron 1 mg twice daily -Frequent neuro check -For precaution -PT/OT  History of subdural hematoma 03/28/20 - see above  Hypercholesterolemia -lipitor  Essential hypertension: -As needed hydralazine IV -Coreg, Cozaar, Cardizem  Depression -Zoloft  Chronic atrial fibrillation (HCC) -Hold Eliquis -Cardizem and Coreg  GERD without esophagitis -Protonix  Chronic systolic CHF (congestive heart failure) ([place]): 2D echo on 03/17/2020 showed EF of 35%.  Patient does not have leg edema JVD.  No shortness of breath.  CHF is compensated. -Check BMP -continue home Lasix        DVT ppx: SCD Code Status: Full code Family Communication:   Yes, patient's daughter-in-law   at bed side Disposition Plan:  Anticipate discharge back to previous environment Consults called:  neurosurgeon, Dr. Izora Ribas and Dr. Irish Elders for neurology are consulted. Admission status: progressive unit for obs      Status is: Observation  The patient remains OBS appropriate and will d/c before 2 midnights.  Dispo: The patient is from: Home              Anticipated d/c is to: Home              Anticipated d/c date is: 1 day              Patient currently is not medically stable to d/c.          Date of Service 04/16/2020    Ivor Costa [facility]   If 7PM-7AM, please contact night-coverage [email or web address] 04/16/2020, 9:47 AM

## 2020-04-16 NOTE — Progress Notes (Signed)
OT Cancellation Note  Patient Details Name: Jamisha Hoeschen MRN: [id number] DOB: [birth year]   Cancelled Treatment:    Reason Eval/Treat Not Completed: Fatigue/lethargy limiting ability to participate  OT consult received and chart reviewed. When OT presented to room, pt was soundly sleeping and did not wake to OT's entrance or voice. Pt's family member in room reporting she's been without adequate sleep for about 24 hours now and politely requests letting her rest for a bit. OT will f/u at later/date time as able for evaluation. Thank you.  Rejeana Brock, MS, OTR/L ascom [phone] 04/16/20, 4:57 PM

## 2020-04-16 NOTE — ED Notes (Signed)
Pt assisted to bathroom. Son at bedside. Pt walks with x1 minimal assist. Denies further needs.

## 2020-04-16 NOTE — Consult Note (Signed)
Referring Physician:  No referring provider defined for this encounter.  Primary Physician:  Marjie Skiff, NP  Chief Complaint:    History of Present Illness: Melinda Preston is a 86 y.o. female who presents to [facility] after having difficulty with speech and word finding, and stuttering, per Dr. Evorn Gong note from 04/16/2020.  She was previously seen at [facility] on 04/04/2020 and noted to have a subdural hematoma, she was treated with Kcentra and discharged home with instructions to continue to hold her Eliquis.  She then continued to complain of persistent headaches and presented to [facility] ED on 04/09/2020.  Imaging obtained at that time showed "No significant change in size of subdural hemorrhage along the left cerebral convexity".   A repeat head CT was obtained during this admission which showed "Since the CT of 04/04/2020, minimal increase in subdural collection along the left cerebral convexity. This is currently hypoattenuating, consistent with evolving blood products. No significant mass effect or midline shift. No evidence of other acute superimposed process."  An MRI was also completed this morning which showed:   "8 mm subacute left holo hemispheric subdural hematoma with mild mass effect on the subjacent left cerebral hemisphere without significant midline shift. 2. Additional trace 2 mm subacute right frontotemporal subdural hematoma without associated mass effect. 3. Underlying age-related cerebral atrophy with moderate chronic small vessel ischemic disease. No other acute intracranial Abnormality".  Neurosurgery was consulted for further recommendations and assistance.  On exam, she reports that her headache has much improved.  She denies any nausea, or vision changes.  She does report frequent falls.  She displays some mild left-sided facial weakness however her speech is fluid.  There is no obvious upper or lower extremity drift noted  Review of Systems:  A 10  point review of systems is negative, except for the pertinent positives and negatives detailed in the HPI.  Past Medical History: Past Medical History:  Diagnosis Date  . Anxiety   . Aortic heart valve narrowing   . Atrial fibrillation (HCC)   . Diabetes mellitus without complication (HCC)   . Disorder of aorta (HCC)   . Hyperlipidemia   . Hypertension   . Insomnia   . Overactive bladder     Past Surgical History: Past Surgical History:  Procedure Laterality Date  . APPENDECTOMY    . rotator cuff surgery Left     Allergies: Allergies as of 04/15/2020 - Review Complete 04/15/2020  Allergen Reaction Noted  . Lisinopril Cough 01/31/2017    Medications:  Current Facility-Administered Medications:  .   stroke: mapping our early stages of recovery book, , Does not apply, Once, Andris Baumann, MD .  acetaminophen (TYLENOL) tablet 650 mg, 650 mg, Oral, Q4H PRN **OR** acetaminophen (TYLENOL) 160 MG/5ML solution 650 mg, 650 mg, Per Tube, Q4H PRN **OR** acetaminophen (TYLENOL) suppository 650 mg, 650 mg, Rectal, Q4H PRN, Andris Baumann, MD .  dexamethasone (DECADRON) 1 MG/ML solution 1 mg, 1 mg, Oral, Q12H, Lorretta Harp, MD, 1 mg at 04/16/20 1023 .  hydrALAZINE (APRESOLINE) injection 5 mg, 5 mg, Intravenous, Q2H PRN, Lorretta Harp, MD .  levETIRAcetam (KEPPRA) tablet 500 mg, 500 mg, Oral, BID, Lorretta Harp, MD, 500 mg at 04/16/20 1022 .  LORazepam (ATIVAN) injection 1 mg, 1 mg, Intravenous, Q4H PRN, Lorretta Harp, MD .  ondansetron [facility]) injection 4 mg, 4 mg, Intravenous, Q8H PRN, Lorretta Harp, MD .  triamcinolone acetonide (KENALOG-40) injection 40 mg, 40 mg, Intramuscular, Once, Mardene Celeste I, NP  Current Outpatient Medications:  .  acetaminophen (TYLENOL) 650 MG CR tablet, Take 650 mg by mouth every 8 (eight) hours as needed for pain., Disp: , Rfl:  .  apixaban (ELIQUIS) 2.5 MG TABS tablet, Take 1 tablet (2.5 mg total) by mouth 2 (two) times daily., Disp: 180 tablet, Rfl: 2 .  aspirin  81 MG tablet, Take 81 mg by mouth daily., Disp: , Rfl:  .  atorvastatin (LIPITOR) 20 MG tablet, Take 1 tablet (20 mg total) by mouth at bedtime., Disp: 90 tablet, Rfl: 3 .  Calcium Carb-Cholecalciferol (CALCIUM 600+D3 PO), Take 1 tablet by mouth daily. , Disp: , Rfl:  .  carvedilol (COREG) 3.125 MG tablet, Take 1 tablet (3.125 mg total) by mouth 2 (two) times daily with a meal., Disp: 180 tablet, Rfl: 3 .  diltiazem (CARDIZEM CD) 180 MG 24 hr capsule, TAKE 1 CAPSULE(180 MG) BY MOUTH DAILY (Patient taking differently: Take 180 mg by mouth daily. ), Disp: 90 capsule, Rfl: 0 .  furosemide (LASIX) 40 MG tablet, Take 40 mg by mouth 2 (two) times daily., Disp: , Rfl:  .  losartan (COZAAR) 25 MG tablet, TAKE 1 TABLET(25 MG) BY MOUTH DAILY (Patient taking differently: Take 25 mg by mouth daily. ), Disp: 90 tablet, Rfl: 1 .  Omega-3 Fatty Acids (FISH OIL) 1000 MG CAPS, Take 1,000 mg by mouth daily., Disp: , Rfl:  .  omeprazole (PRILOSEC) 20 MG capsule, TAKE 1 CAPSULE(20 MG) BY MOUTH DAILY (Patient taking differently: Take 20 mg by mouth daily. ), Disp: 90 capsule, Rfl: 1 .  potassium chloride SA (KLOR-CON) 20 MEQ tablet, TAKE 1 TABLET(20 MEQ) BY MOUTH DAILY (Patient taking differently: Take 20 mEq by mouth daily. ), Disp: 90 tablet, Rfl: 3 .  sertraline (ZOLOFT) 25 MG tablet, TAKE 1 TABLET(25 MG) BY MOUTH DAILY (Patient taking differently: Take 25 mg by mouth daily. ), Disp: 90 tablet, Rfl: 3 .  traZODone (DESYREL) 50 MG tablet, TAKE 1 TABLET(50 MG) BY MOUTH AT BEDTIME (Patient taking differently: Take 50 mg by mouth at bedtime. TAKE 1 TABLET(50 MG) BY MOUTH AT BEDTIME), Disp: 90 tablet, Rfl: 0 .  VITAMIN D, CHOLECALCIFEROL, PO, Take 600 Units by mouth daily. , Disp: , Rfl:    Social History: Social History   Tobacco Use  . Smoking status: Never Smoker  . Smokeless tobacco: Never Used  Substance Use Topics  . Alcohol use: No  . Drug use: No    Family Medical History: Family History  Problem  Relation Age of Onset  . Heart disease Father   . Heart disease Brother   . Diabetes Son   . Breast cancer Sister     Physical Examination: Vitals:   04/16/20 1130 04/16/20 1200  BP: 136/85 134/77  Pulse: 84 85  Resp: 15 16  Temp:    SpO2: 98% 96%     General: Patient is well developed, well nourished, calm, collected, and in no apparent distress.  Psychiatric: Patient is non-anxious.  Head:  Pupils equal, round, and reactive to light.  ENT:  Oral mucosa appears well hydrated.  Neck:   No TTP over cervical, thoracic, or lumbar spine.   Respiratory: Patient is breathing without any difficulty.  Extremities: No edema.  NEUROLOGICAL:  General: In no acute distress.   Awake, alert, oriented to person, place, and time.  Pupils equal round and reactive to light.  Mild left-sided facial weakness which corrects with smile.   There is no pronator drift. Facial sensation equal  bilaterally.  Speech is fluent.  She is able to name 3 out of 3 objects correctly.  Able to repeat short phrases.  Does have some difficulty with counting backwards from 10 but able to correct herself.  ROM of spine: Deferred as patient laying in bed. Palpation of spine: Nontender over entirety of spine  Strength: Side Biceps Triceps Deltoid Interossei Grip  R 5 5 5 5 5   L 5 5 5 5 5    Side Iliopsoas Quads Hamstring PF DF EHL  R 5 5 5 5 5 5   L 5 5 5 5 5 5    Bilateral upper and lower extremity sensation is intact to light touch.  Clonus is not present.   Hoffman's is absent.  Imaging: Head CT 04/15/2020:  IMPRESSION: 1. Since the CT of 04/04/2020, minimal increase in subdural collection along the left cerebral convexity. This is currently hypoattenuating, consistent with evolving blood products. No significant mass effect or midline shift. 2. No evidence of other acute superimposed process.  MRI 04/16/2020 0411: IMPRESSION: 1. 8 mm subacute left holo hemispheric subdural hematoma with mild mass  effect on the subjacent left cerebral hemisphere without significant midline shift. 2. Additional trace 2 mm subacute right frontotemporal subdural hematoma without associated mass effect. 3. Underlying age-related cerebral atrophy with moderate chronic small vessel ischemic disease. No other acute intracranial abnormality.    Assessment and Plan: Melinda Preston is a pleasant 86 y.o. female with forted slurred speech and difficulty with word finding which started last night.  Head CT completed yesterday shows minimal increase in subdural collection along the left cerebral convexity, and MRI from this morning does show trace 2 mm subacute right frontotemporal subdural hematoma without associated mass-effect.  No acute neurosurgical intervention is required at this time, we recommend that she hold her Eliquis if possible until her follow-up appointment with 04/06/2020 in July.  She can be started on Keppra 500 mg twice daily while in the hospital, would decrease to 250 mg twice daily as outpatient and continuing until her follow-up with Kathlen Brunswick in July.  A small dose of dexamethasone may be started, 1 mg twice daily while she is in the hospital, and 0.5 mg twice daily once discharged, for a total of 2 weeks.  We will see her on July 1st with a repeat head CT at that time.  [place], NP Dept. of Neurosurgery

## 2020-04-16 NOTE — ED Notes (Signed)
This RN assisted pt to bathroom as a standby assist. Pt able to ambulate back to bed with no issue. No further needs expressed.

## 2020-04-16 NOTE — Progress Notes (Signed)
CCMD called to inform PT heart rate increasing to 130. Patient assessed and is currently using toilet with NT. Asymptomatic at this time. Will cont to assess

## 2020-04-16 NOTE — ED Provider Notes (Signed)
[facility] Emergency Department Provider Note  ____________________________________________   First MD Initiated Contact with Patient 04/16/20 [phone]     (approximate)  I have reviewed the triage vital signs and the nursing notes.   HISTORY  Chief Complaint Aphasia    HPI Melinda Preston is a 86 y.o. female who has a complicated chronic and recent medical history that includes chronic A. fib previously on Eliquis and a fall a few weeks ago that resulted in a subdural hematoma.  This was identified at [facility] on 5/21 and she refused transfer to [facility].  She was determined to be stable, Kcentra was given to reverse the Eliquis, and she went home off of Eliquis.  She went back to [facility] on 5/26 for persistent headache and the subdural was again found to be stable.  She presents tonight for evaluation of some word finding difficulties and change in speech patterns.  Her son also reports that she has been more forgetful than usual.  She had some numbness in her right hand earlier when she first got up this morning but she thinks that was because she was lying on it because it went away shortly after she woke up.  She has no focal weakness.  She has a persistent headache that she has had for weeks that is in the front left part of her head.  She currently states that she feels pretty well except for the headache which is normal for her.  She denies fever, neck pain or stiffness, chest pain, shortness of breath, cough, nausea, vomiting, and abdominal pain.  The symptoms have been intermittent and mild but concerning given her history.  She reports that she has been compliant with her medication list including not taking the Eliquis and she was told to come off of it.         Past Medical History:  Diagnosis Date  . Anxiety   . Aortic heart valve narrowing   . Atrial fibrillation (HCC)   . Diabetes mellitus without complication (HCC)   . Disorder of aorta (HCC)   .  Hyperlipidemia   . Hypertension   . Insomnia   . Overactive bladder     Patient Active Problem List   Diagnosis Date Noted  . History of subdural hematoma 03/28/20 04/16/2020  . Acute focal neurological deficit 04/16/2020  . Headache 04/04/2020  . Fall 04/04/2020  . IFG (impaired fasting glucose) 02/21/2018  . GERD without esophagitis 02/21/2018  . Advanced care planning/counseling discussion 08/09/2017  . Heart failure (HCC) 10/25/2016  . Hypercholesterolemia 12/22/2015  . Essential hypertension 12/22/2015  . Depression 12/22/2015  . Chronic atrial fibrillation (HCC) 12/22/2015  . Arthritis, senescent 12/22/2015  . Anemia 12/22/2015  . Insomnia 12/22/2015  . Overactive bladder 12/22/2015    Past Surgical History:  Procedure Laterality Date  . APPENDECTOMY    . rotator cuff surgery Left     Prior to Admission medications   Medication Sig Start Date End Date Taking? Authorizing Provider  acetaminophen (TYLENOL) 650 MG CR tablet Take 650 mg by mouth every 8 (eight) hours as needed for pain.   Yes [provider]  apixaban (ELIQUIS) 2.5 MG TABS tablet Take 1 tablet (2.5 mg total) by mouth 2 (two) times daily. 09/05/19  Yes [name], Corrie Dandy T, NP  aspirin 81 MG tablet Take 81 mg by mouth daily.   Yes [provider]  atorvastatin (LIPITOR) 20 MG tablet Take 1 tablet (20 mg total) by mouth at bedtime.  12/03/19  Yes [name], Corrie DandyJolene T, NP  Calcium Carb-Cholecalciferol (CALCIUM 600+D3 PO) Take 1 tablet by mouth daily.    Yes [provider]  carvedilol (COREG) 3.125 MG tablet Take 1 tablet (3.125 mg total) by mouth 2 (two) times daily with a meal. 02/21/18  Yes Gabriel CirriWicker, Cheryl, NP  diltiazem (CARDIZEM CD) 180 MG 24 hr capsule TAKE 1 CAPSULE(180 MG) BY MOUTH DAILY Patient taking differently: Take 180 mg by mouth daily.  02/17/20  Yes [name], Jolene T, NP  furosemide (LASIX) 40 MG tablet Take 40 mg by mouth 2 (two) times daily.   Yes [provider]    losartan (COZAAR) 25 MG tablet TAKE 1 TABLET(25 MG) BY MOUTH DAILY Patient taking differently: Take 25 mg by mouth daily.  03/16/20  Yes [name], Jolene T, NP  Omega-3 Fatty Acids (FISH OIL) 1000 MG CAPS Take 1,000 mg by mouth daily.   Yes [provider]  omeprazole (PRILOSEC) 20 MG capsule TAKE 1 CAPSULE(20 MG) BY MOUTH DAILY Patient taking differently: Take 20 mg by mouth daily.  02/17/20  Yes [name], Jolene T, NP  potassium chloride SA (KLOR-CON) 20 MEQ tablet TAKE 1 TABLET(20 MEQ) BY MOUTH DAILY Patient taking differently: Take 20 mEq by mouth daily.  09/24/19  Yes [name], Jolene T, NP  sertraline (ZOLOFT) 25 MG tablet TAKE 1 TABLET(25 MG) BY MOUTH DAILY Patient taking differently: Take 25 mg by mouth daily.  01/08/20  Yes [name], Jolene T, NP  traZODone (DESYREL) 50 MG tablet TAKE 1 TABLET(50 MG) BY MOUTH AT BEDTIME Patient taking differently: Take 50 mg by mouth at bedtime. TAKE 1 TABLET(50 MG) BY MOUTH AT BEDTIME 02/17/20  Yes [name], Jolene T, NP  VITAMIN D, CHOLECALCIFEROL, PO Take 600 Units by mouth daily.    Yes [provider]    Allergies Lisinopril  Family History  Problem Relation Age of Onset  . Heart disease Father   . Heart disease Brother   . Diabetes Son   . Breast cancer Sister     Social History Social History   Tobacco Use  . Smoking status: Never Smoker  . Smokeless tobacco: Never Used  Substance Use Topics  . Alcohol use: No  . Drug use: No    Review of Systems Constitutional: No fever/chills Eyes: No visual changes. ENT: No sore throat. Cardiovascular: Denies chest pain. Respiratory: Denies shortness of breath. Gastrointestinal: No abdominal pain.  No nausea, no vomiting.  No diarrhea.  No constipation. Genitourinary: Negative for dysuria. Musculoskeletal: Negative for neck pain.  Negative for back pain. Integumentary: Negative for rash. Neurological: Word finding difficulties.  Persistent left frontal headache.  Speech pattern  changes.  No focal weakness or numbness.   ____________________________________________   PHYSICAL EXAM:  VITAL SIGNS: ED Triage Vitals  Enc Vitals Group     BP 04/15/20 2001 (!) 148/98     Pulse Rate 04/15/20 2001 (!) 56     Resp 04/15/20 2001 16     Temp 04/15/20 2001 98.1 F (36.7 C)     Temp Source 04/15/20 2001 Oral     SpO2 04/15/20 2001 97 %     Weight 04/15/20 2002 68.9 kg (151 lb 14.4 oz)     Height 04/15/20 2002 1.549 m (5\' 1" )     Head Circumference --      Peak Flow --      Pain Score 04/15/20 2001 10     Pain Loc --      Pain Edu? --  Excl. in GC? --     Constitutional: Alert and oriented.  Eyes: Conjunctivae are normal.  Pupils are equal and reactive. Head: Atraumatic. Nose: No congestion/rhinnorhea. Mouth/Throat: Patient is wearing a mask. Neck: No stridor.  No meningeal signs.   Cardiovascular: Normal rate, regular rhythm. Good peripheral circulation. Grossly normal heart sounds. Respiratory: Normal respiratory effort.  No retractions. Gastrointestinal: Soft and nontender. No distention.  Musculoskeletal: No lower extremity tenderness nor edema. No gross deformities of extremities. Neurologic:  Normal speech and language. No gross focal neurologic deficits are appreciated including cranial nerve deficits.  Good grip and muscle group strength throughout.  No tremor.  She occasionally seems to have some word finding difficulties but it is difficult to appreciate if this is expressive aphasia or if this is simply memory issues given her age. Skin:  Skin is warm, dry and intact. Psychiatric: Mood and affect are normal. Speech and behavior are normal.  ____________________________________________   LABS (all labs ordered are listed, but only abnormal results are displayed)  Labs Reviewed  BASIC METABOLIC PANEL - Abnormal; Notable for the following components:      Result Value   Glucose, Bld 137 (*)    GFR calc non Af Amer 57 (*)    All other  components within normal limits  CBC WITH DIFFERENTIAL/PLATELET - Abnormal; Notable for the following components:   RBC 5.52 (*)    MCV 79.2 (*)    MCH 25.7 (*)    All other components within normal limits  SARS CORONAVIRUS 2 BY RT PCR (HOSPITAL ORDER, PERFORMED IN [facility])  PROTIME-INR  APTT  URINALYSIS, COMPLETE (UACMP) WITH MICROSCOPIC  TROPONIN I (HIGH SENSITIVITY)   ____________________________________________  EKG  ED ECG REPORT I, Loleta Rose, the attending physician, personally viewed and interpreted this ECG.  Date: 04/15/2020 EKG Time: 20: 29 Rate: 95 Rhythm: A. fib with PVC QRS Axis: Left axis deviation Intervals: Left bundle branch block ST/T Wave abnormalities: Non-specific ST segment / T-wave changes, but no clear evidence of acute ischemia. Narrative Interpretation: no definitive evidence of acute ischemia; does not meet STEMI criteria.   ____________________________________________  RADIOLOGY I, Loleta Rose, personally viewed and evaluated these images (plain radiographs) as part of my medical decision making, as well as reviewing the written report by the radiologist.  ED MD interpretation: Question of worsening subdural hematoma which remains relatively small.  Official radiology report(s): CT HEAD WO CONTRAST  Result Date: 04/15/2020 CLINICAL DATA:  New speech changes over the past 2 days. History of subdural hematoma. EXAM: CT HEAD WITHOUT CONTRAST TECHNIQUE: Contiguous axial images were obtained from the base of the skull through the vertex without intravenous contrast. COMPARISON:  04/04/2020 FINDINGS: Brain: Expected cerebral and cerebellar volume loss for age. The previously described left-sided subdural collection is again identified. Currently low-density, consistent with evolution of blood products. Maximally 6 mm on 13/3 anteriorly. On the order of 4 mm at the same level on the prior exam (when remeasured). No significant mass  effect or midline shift. No acute infarct, intra-axial fluid collection, or hydrocephalus. Vascular: Intracranial atherosclerosis. Skull: No significant soft tissue swelling.  No skull fracture. Sinuses/Orbits: Normal imaged portions of the orbits and globes. Clear paranasal sinuses and mastoid air cells. Other: None. IMPRESSION: 1. Since the CT of 04/04/2020, minimal increase in subdural collection along the left cerebral convexity. This is currently hypoattenuating, consistent with evolving blood products. No significant mass effect or midline shift. 2. No evidence of other acute superimposed process. Electronically  Signed   By: Abigail Miyamoto M.D.   On: 04/15/2020 20:45   MR BRAIN WO CONTRAST  Result Date: 04/16/2020 CLINICAL DATA:  Initial evaluation for subdural hematoma. EXAM: MRI HEAD WITHOUT CONTRAST TECHNIQUE: Multiplanar, multiecho pulse sequences of the brain and surrounding structures were obtained without intravenous contrast. COMPARISON:  Prior head CT from 04/15/2020. FINDINGS: Brain: Generalized age-related cerebral atrophy. Patchy T2/FLAIR hyperintensity within the periventricular deep white matter both cerebral hemispheres most consistent with chronic small vessel ischemic disease, moderate in nature. No abnormal foci of restricted diffusion to suggest acute or subacute ischemia. Gray-white matter differentiation maintained. No encephalomalacia to suggest chronic cortical infarction. No acute intracranial hemorrhage within the brain itself. Few scattered chronic micro hemorrhages noted clustered about the splenium and deep gray nuclei, likely related to underlying hypertension. No mass lesion. Subacute left holo hemispheric subdural hematoma again seen, measuring up to 8 mm in maximal thickness at the level of the left frontal lobe. Mild mass effect on the subjacent left cerebral hemisphere without significant midline shift. Additional trace right-sided subdural hematoma is seen overlying the  right frontotemporal convexity, measuring no more than 2 mm in maximal thickness without associated mass effect. This is also subacute in appearance. No midline shift or hydrocephalus. Pituitary gland suprasellar region normal. Midline structures intact. Vascular: Major intracranial vascular flow voids are maintained. Skull and upper cervical spine: Craniocervical junction within normal limits. Upper cervical spine normal. Bone marrow signal intensity within normal limits. No scalp soft tissue abnormality. Sinuses/Orbits: Patient status post bilateral ocular lens replacement. Globes and orbital soft tissues demonstrate no acute finding. Tiny left maxillary sinus retention cyst noted. Paranasal sinuses otherwise largely clear. Trace left mastoid effusion noted. Inner ear structures grossly normal. Other: None. IMPRESSION: 1. 8 mm subacute left holo hemispheric subdural hematoma with mild mass effect on the subjacent left cerebral hemisphere without significant midline shift. 2. Additional trace 2 mm subacute right frontotemporal subdural hematoma without associated mass effect. 3. Underlying age-related cerebral atrophy with moderate chronic small vessel ischemic disease. No other acute intracranial abnormality. Electronically Signed   By: Jeannine Boga M.D.   On: 04/16/2020 04:26    ____________________________________________   PROCEDURES   Procedure(s) performed (including Critical Care):  Procedures   ____________________________________________   INITIAL IMPRESSION / MDM / [place] / ED COURSE  As part of my medical decision making, I reviewed the following data within the [place] History obtained from family, Nursing notes reviewed and incorporated, Labs reviewed , EKG interpreted , Old chart reviewed and Notes from prior ED visits and discussed with admitting physician   Differential diagnosis includes, but is not limited to, worsening subdural  hematoma, acute other type of intracranial bleeding, CVA, seizure, electrolyte or metabolic abnormality, acute infection.    No clinical signs of infection.  Patient is comfortable and in no distress.  No obvious focal neurological deficits although she does have a little bit of word finding difficulty but this could be simply age and memory related.  The patient is on the cardiac monitor to evaluate for evidence of arrhythmia and/or significant heart rate changes.  CT scan demonstrates possible worsening of the subdural hematoma.  I spoke with Dr. Lacinda Axon with neurosurgery at about 2:00 AM.  He reviewed the images and thought that the changes were very mild and that the subdural hematoma itself is very mild.  He did not think he could explain symptoms that could more likely be due to CVA since she has  no longer anticoagulated and has A. fib.  He recommended MRI brain.  If the MR brain is negative he recommended admission for neurology consultation and EEG (spot test, not continuous).  In his opinion he felt like the patient could go back on Eliquis if the neurologist felt it was important to avoid stroke.  I updated the patient and her son and they are in agreement with the current plan.  I ordered a Percocet 1 tablet for her ongoing headache prior to the MRI.     Clinical Course as of Apr 16 713  Wed Apr 16, 2020  1224 MRI shows subdural hematoma in more detail than previously seen on CT scan with a small amount of mass-effect but no significant midline shift.  No evidence of CVA.  No indication to consult Dr. Adriana Simas again tonight by phone.  I will proceed with his plan for admission for neurology consult and EEG.  I will recommend to the hospitalist that they also formally consult Dr. Adriana Simas as well.  I updated the patient and her son who understand and agree with the plan.   [CF]    Clinical Course User Index [CF] Loleta Rose, MD     ____________________________________________  FINAL  CLINICAL IMPRESSION(S) / ED DIAGNOSES  Final diagnoses:  Aphasia  Subacute subdural hematoma (HCC)  Chronic a-fib (HCC)     MEDICATIONS GIVEN DURING THIS VISIT:  Medications   stroke: mapping our early stages of recovery book (has no administration in time range)  0.9 %  sodium chloride infusion (has no administration in time range)  acetaminophen (TYLENOL) tablet 650 mg (has no administration in time range)    Or  acetaminophen (TYLENOL) 160 MG/5ML solution 650 mg (has no administration in time range)    Or  acetaminophen (TYLENOL) suppository 650 mg (has no administration in time range)  oxyCODONE-acetaminophen (PERCOCET/ROXICET) 5-325 MG per tablet 1 tablet (1 tablet Oral Given 04/16/20 0320)     ED Discharge Orders    None      *Please note:  Roshell Brigham was evaluated in Emergency Department on 04/16/2020 for the symptoms described in the history of present illness. She was evaluated in the context of the global COVID-19 pandemic, which necessitated consideration that the patient might be at risk for infection with the SARS-CoV-2 virus that causes COVID-19. Institutional protocols and algorithms that pertain to the evaluation of patients at risk for COVID-19 are in a state of rapid change based on information released by regulatory bodies including the CDC and federal and state organizations. These policies and algorithms were followed during the patient's care in the ED.  Some ED evaluations and interventions may be delayed as a result of limited staffing during the pandemic.*  Note:  This document was prepared using Dragon voice recognition software and may include unintentional dictation errors.   Loleta Rose, MD 04/16/20 [phone]

## 2020-04-16 NOTE — Consult Note (Signed)
Reason for Consult: difficulty speaking  Requesting Physician: Dr. Clyde Lundborg  CC: Difficulty speaking  HPI: Melinda Preston is an 86 y.o. female  with medical history significant of recent SDH due to fall, hypertension, hyperlipidemia, diet-controlled diabetes, GERD, depression, anxiety, atrial fibrillation (Eliquis on hold), sCHF with EF of 35%, who presents with difficulty speaking, right hand numbness and dizziness. Recent evaluation at [facility] on 5/21 post fall with SDH with Kcentra administration. 5/26 evaluation with stable SDH for headaches.   Presented now with difficulty speaking, difficulty finding words and stuttering. Improved and appears at baseline right now.     Past Medical History:  Diagnosis Date  . Anxiety   . Aortic heart valve narrowing   . Atrial fibrillation (HCC)   . Diabetes mellitus without complication (HCC)   . Disorder of aorta (HCC)   . Hyperlipidemia   . Hypertension   . Insomnia   . Overactive bladder     Past Surgical History:  Procedure Laterality Date  . APPENDECTOMY    . rotator cuff surgery Left     Family History  Problem Relation Age of Onset  . Heart disease Father   . Heart disease Brother   . Diabetes Son   . Breast cancer Sister     Social History:  reports that she has never smoked. She has never used smokeless tobacco. She reports that she does not drink alcohol or use drugs.  Allergies  Allergen Reactions  . Lisinopril Cough    Medications: I have reviewed the patient's current medications.  ROS: History obtained from the patient  General ROS: negative for - chills, fatigue, fever, night sweats, weight gain or weight loss Psychological ROS: negative for - behavioral disorder, hallucinations, memory difficulties, mood swings or suicidal ideation Ophthalmic ROS: negative for - blurry vision, double vision, eye pain or loss of vision ENT ROS: negative for - epistaxis, nasal discharge, oral lesions, sore throat, tinnitus or  vertigo Allergy and Immunology ROS: negative for - hives or itchy/watery eyes Hematological and Lymphatic ROS: negative for - bleeding problems, bruising or swollen lymph nodes Endocrine ROS: negative for - galactorrhea, hair pattern changes, polydipsia/polyuria or temperature intolerance Respiratory ROS: negative for - cough, hemoptysis, shortness of breath or wheezing Cardiovascular ROS: negative for - chest pain, dyspnea on exertion, edema or irregular heartbeat Gastrointestinal ROS: negative for - abdominal pain, diarrhea, hematemesis, nausea/vomiting or stool incontinence Genito-Urinary ROS: negative for - dysuria, hematuria, incontinence or urinary frequency/urgency Musculoskeletal ROS: negative for - joint swelling or muscular weakness Neurological ROS: as noted in HPI Dermatological ROS: negative for rash and skin lesion changes  Physical Examination: Blood pressure 117/66, pulse 77, temperature 98.1 F (36.7 C), temperature source Oral, resp. rate 16, height 5\' 1"  (1.549 m), weight 68.9 kg, SpO2 96 %.    Neurological Examination   Mental Status: Alert, oriented, thought content appropriate.  Speech fluent without evidence of aphasia.  Able to follow 3 step commands without difficulty. Cranial Nerves: II: Discs flat bilaterally; Visual fields grossly normal, pupils equal, round, reactive to light and accommodation III,IV, VI: ptosis not present, extra-ocular motions intact bilaterally V,VII: smile symmetric, facial light touch sensation normal bilaterally VIII: hearing normal bilaterally IX,X: gag reflex present XI: bilateral shoulder shrug XII: midline tongue extension Motor: Right : Upper extremity   5/5    Left:     Upper extremity   5/5  Lower extremity   4/5     Lower extremity   4/5 Tone and bulk:normal tone throughout;  no atrophy noted Sensory: Pinprick and light touch intact throughout, bilaterally Deep Tendon Reflexes: 1+ and symmetric throughout Plantars: Right:  downgoing   Left: downgoing Cerebellar: normal finger-to-nose      Laboratory Studies:   Basic Metabolic Panel: Recent Labs  Lab 04/15/20 2009  NA 138  K 3.6  CL 99  CO2 27  GLUCOSE 137*  BUN 22  CREATININE 0.89  CALCIUM 9.3    Liver Function Tests: No results for input(s): AST, ALT, ALKPHOS, BILITOT, PROT, ALBUMIN in the last 168 hours. No results for input(s): LIPASE, AMYLASE in the last 168 hours. No results for input(s): AMMONIA in the last 168 hours.  CBC: Recent Labs  Lab 04/15/20 2009  WBC 9.8  NEUTROABS 7.4  HGB 14.2  HCT 43.7  MCV 79.2*  PLT 338    Cardiac Enzymes: No results for input(s): CKTOTAL, CKMB, CKMBINDEX, TROPONINI in the last 168 hours.  BNP: Invalid input(s): POCBNP  CBG: No results for input(s): GLUCAP in the last 168 hours.  Microbiology: Results for orders placed or performed during the hospital encounter of 04/16/20  SARS Coronavirus 2 by RT PCR (hospital order, performed in [facility] hospital lab) Nasopharyngeal Nasopharyngeal Swab     Status: None   Collection Time: 04/16/20  6:07 AM   Specimen: Nasopharyngeal Swab  Result Value Ref Range Status   SARS Coronavirus 2 NEGATIVE NEGATIVE Final    Comment: (NOTE) SARS-CoV-2 target nucleic acids are NOT DETECTED. The SARS-CoV-2 RNA is generally detectable in upper and lower respiratory specimens during the acute phase of infection. The lowest concentration of SARS-CoV-2 viral copies this assay can detect is 250 copies / mL. A negative result does not preclude SARS-CoV-2 infection and should not be used as the sole basis for treatment or other patient management decisions.  A negative result may occur with improper specimen collection / handling, submission of specimen other than nasopharyngeal swab, presence of viral mutation(s) within the areas targeted by this assay, and inadequate number of viral copies (<250 copies / mL). A negative result must be combined with  clinical observations, patient history, and epidemiological information. Fact Sheet for Patients:   BoilerBrush.com.cy Fact Sheet for Healthcare Providers: https://pope.com/ This test is not yet approved or cleared  by the [place] FDA and has been authorized for detection and/or diagnosis of SARS-CoV-2 by FDA under an Emergency Use Authorization (EUA).  This EUA will remain in effect (meaning this test can be used) for the duration of the COVID-19 declaration under Section 564(b)(1) of the Act, 21 U.S.C. section 360bbb-3(b)(1), unless the authorization is terminated or revoked sooner. Performed at [facility], [address] [place], [place] [address]     Coagulation Studies: Recent Labs    04/15/20 23-Mar-2030  LABPROT 13.7  INR 1.1    Urinalysis: No results for input(s): COLORURINE, LABSPEC, PHURINE, GLUCOSEU, HGBUR, BILIRUBINUR, KETONESUR, PROTEINUR, UROBILINOGEN, NITRITE, LEUKOCYTESUR in the last 168 hours.  Invalid input(s): APPERANCEUR  Lipid Panel:     Component Value Date/Time   CHOL 155 06/05/2019 1029   CHOL 162 12/22/2015 1440   TRIG 121 06/05/2019 1029   TRIG 137 12/22/2015 1440   HDL 54 06/05/2019 1029   VLDL 27 12/22/2015 1440   LDLCALC 77 06/05/2019 1029    HgbA1C:  Lab Results  Component Value Date   HGBA1C 6.3 (H) 08/25/2018    Urine Drug Screen:  No results found for: LABOPIA, COCAINSCRNUR, LABBENZ, AMPHETMU, THCU, LABBARB  Alcohol Level: No results for input(s): [facility]  in the last 168 hours.  Other results: EKG: normal EKG, normal sinus rhythm, unchanged from previous tracings.  Imaging: CT HEAD WO CONTRAST  Result Date: 04/15/2020 CLINICAL DATA:  New speech changes over the past 2 days. History of subdural hematoma. EXAM: CT HEAD WITHOUT CONTRAST TECHNIQUE: Contiguous axial images were obtained from the base of the skull through the vertex without intravenous contrast. COMPARISON:   04/04/2020 FINDINGS: Brain: Expected cerebral and cerebellar volume loss for age. The previously described left-sided subdural collection is again identified. Currently low-density, consistent with evolution of blood products. Maximally 6 mm on 13/3 anteriorly. On the order of 4 mm at the same level on the prior exam (when remeasured). No significant mass effect or midline shift. No acute infarct, intra-axial fluid collection, or hydrocephalus. Vascular: Intracranial atherosclerosis. Skull: No significant soft tissue swelling.  No skull fracture. Sinuses/Orbits: Normal imaged portions of the orbits and globes. Clear paranasal sinuses and mastoid air cells. Other: None. IMPRESSION: 1. Since the CT of 04/04/2020, minimal increase in subdural collection along the left cerebral convexity. This is currently hypoattenuating, consistent with evolving blood products. No significant mass effect or midline shift. 2. No evidence of other acute superimposed process. Electronically Signed   By: Abigail Miyamoto M.D.   On: 04/15/2020 20:45   MR BRAIN WO CONTRAST  Result Date: 04/16/2020 CLINICAL DATA:  Initial evaluation for subdural hematoma. EXAM: MRI HEAD WITHOUT CONTRAST TECHNIQUE: Multiplanar, multiecho pulse sequences of the brain and surrounding structures were obtained without intravenous contrast. COMPARISON:  Prior head CT from 04/15/2020. FINDINGS: Brain: Generalized age-related cerebral atrophy. Patchy T2/FLAIR hyperintensity within the periventricular deep white matter both cerebral hemispheres most consistent with chronic small vessel ischemic disease, moderate in nature. No abnormal foci of restricted diffusion to suggest acute or subacute ischemia. Gray-white matter differentiation maintained. No encephalomalacia to suggest chronic cortical infarction. No acute intracranial hemorrhage within the brain itself. Few scattered chronic micro hemorrhages noted clustered about the splenium and deep gray nuclei, likely  related to underlying hypertension. No mass lesion. Subacute left holo hemispheric subdural hematoma again seen, measuring up to 8 mm in maximal thickness at the level of the left frontal lobe. Mild mass effect on the subjacent left cerebral hemisphere without significant midline shift. Additional trace right-sided subdural hematoma is seen overlying the right frontotemporal convexity, measuring no more than 2 mm in maximal thickness without associated mass effect. This is also subacute in appearance. No midline shift or hydrocephalus. Pituitary gland suprasellar region normal. Midline structures intact. Vascular: Major intracranial vascular flow voids are maintained. Skull and upper cervical spine: Craniocervical junction within normal limits. Upper cervical spine normal. Bone marrow signal intensity within normal limits. No scalp soft tissue abnormality. Sinuses/Orbits: Patient status post bilateral ocular lens replacement. Globes and orbital soft tissues demonstrate no acute finding. Tiny left maxillary sinus retention cyst noted. Paranasal sinuses otherwise largely clear. Trace left mastoid effusion noted. Inner ear structures grossly normal. Other: None. IMPRESSION: 1. 8 mm subacute left holo hemispheric subdural hematoma with mild mass effect on the subjacent left cerebral hemisphere without significant midline shift. 2. Additional trace 2 mm subacute right frontotemporal subdural hematoma without associated mass effect. 3. Underlying age-related cerebral atrophy with moderate chronic small vessel ischemic disease. No other acute intracranial abnormality. Electronically Signed   By: Jeannine Boga M.D.   On: 04/16/2020 04:26     Assessment/Plan:  86 y.o. female  with medical history significant of recent SDH due to fall, hypertension, hyperlipidemia, diet-controlled diabetes, GERD, depression,  anxiety, atrial fibrillation (Eliquis on hold), sCHF with EF of 35%, who presents with difficulty  speaking, right hand numbness and dizziness. Recent evaluation at [facility] on 5/21 post fall with SDH with Kcentra administration. 5/26 evaluation with stable SDH for headaches.   Presented now with difficulty speaking, difficulty finding words and stuttering. Improved and appears at baseline right now.    - MRI brain with L SDH of and 26mm R frontoparietal 38mm sdh - no significant shift. Not convinced surgical candidate but Neurosurgery following - Agree with continuation Keppra 500 BID - EEG today - lives with son.  - pt/ot - likely case management since increased falls and possibly needs more assistance at home  04/16/2020, 11:21 AM

## 2020-04-16 NOTE — Evaluation (Signed)
Physical Therapy Evaluation Patient Details Name: Melinda Preston MRN: [id number] DOB: 1928/07/04 Today's Date: 04/16/2020   History of Present Illness  Per MD notes: Pt is an 86 y.o. female  with medical history significant of recent SDH due to fall, hypertension, hyperlipidemia, diet-controlled diabetes, GERD, depression, anxiety, A-fib, sCHF with EF of 35%, who presented with difficulty speaking, right hand numbness and dizziness. Recent evaluation at [facility] on 5/21 post fall with SDH and on 5/26 with evaluation for stable SDH for headaches.  Presented now with difficulty speaking, difficulty finding words and stuttering.  MRI of brain with L SDH of 8MM and 52mm R frontoparietal SDH.    Clinical Impression  Pt pleasant and motivated to participate during the session.  Pt Ind with bed mobility with good speed and effort and SBA with with transfers and amb with a RW.  Pt initially ambulated 30 feet without an AD with noted drifting left/right and reaching for furniture/walls for support.  Daughter-in-law stated that is not baseline for patient with pt typically much steadier during ambulation without an AD.  With a RW the pt was able to amb with SBA with increased cadence and no significant drifting.  Pt currently receiving OPPT and would benefit from continuing with OPPT services.  Pt/family educated on recommendation for SBA with OOB activity until told otherwise by OPPT and for pt to avoid driving until given OK by MD.        Follow Up Recommendations Outpatient PT;Supervision for mobility/OOB    Equipment Recommendations  Rolling walker with 5" wheels    Recommendations for Other Services       Precautions / Restrictions Precautions Precautions: Fall Restrictions Weight Bearing Restrictions: No      Mobility  Bed Mobility Overal bed mobility: Independent                Transfers Overall transfer level: Needs assistance Equipment used: Rolling walker (2  wheeled) Transfers: Sit to/from Stand Sit to Stand: Supervision         General transfer comment: Good eccentric and concentric control  Ambulation/Gait Ambulation/Gait assistance: Supervision with RW, CGA without AD Gait Distance (Feet): 100 Feet with RW, 30 feet without an AD Assistive device: Rolling walker (2 wheeled);None Gait Pattern/deviations: Step-through pattern;Drifts right/left;Decreased step length - right;Decreased step length - left Gait velocity: decreased   General Gait Details: Pt mildy unsteady with amb without an AD with drifting and reaching for furniture/walls for support; significant improvement in stability and cadence with RW with no drifting or LOB  Stairs            Wheelchair Mobility    Modified Rankin (Stroke Patients Only)       Balance Overall balance assessment: Needs assistance   Sitting balance-Leahy Scale: Normal     Standing balance support: Bilateral upper extremity supported;During functional activity Standing balance-Leahy Scale: Good                               Pertinent Vitals/Pain Pain Assessment: No/denies pain    Home Living Family/patient expects to be discharged to:: Private residence Living Arrangements: Children Available Help at Discharge: Family;Available PRN/intermittently Type of Home: Mobile home Home Access: Stairs to enter Entrance Stairs-Rails: Can reach both;Left;Right Entrance Stairs-Number of Steps: 2 Home Layout: One level Home Equipment: Bedside commode;Walker - 4 wheels;Cane - single point      Prior Function Level of Independence: Independent  Comments: Ind amb community distances without an AD, drives, currently receiving OPPT for strength and balance, Ind with ADLs, 2 falls in last 6 months secondary to LOB     Hand Dominance        Extremity/Trunk Assessment   Upper Extremity Assessment Upper Extremity Assessment: Overall WFL for tasks assessed    Lower  Extremity Assessment Lower Extremity Assessment: Overall WFL for tasks assessed       Communication   Communication: Expressive difficulties  Cognition Arousal/Alertness: Awake/alert Behavior During Therapy: WFL for tasks assessed/performed Overall Cognitive Status: Within Functional Limits for tasks assessed                                        General Comments      Exercises     Assessment/Plan    PT Assessment Patient needs continued PT services  PT Problem List Decreased balance;Decreased knowledge of use of DME;Decreased safety awareness       PT Treatment Interventions DME instruction;Gait training;Stair training;Functional mobility training;Therapeutic activities;Therapeutic exercise;Balance training;Patient/family education    PT Goals (Current goals can be found in the Care Plan section)  Acute Rehab PT Goals Patient Stated Goal: To get back home PT Goal Formulation: With patient Time For Goal Achievement: 04/29/20 Potential to Achieve Goals: Good    Frequency Min 2X/week   Barriers to discharge        Co-evaluation               AM-PAC PT "6 Clicks" Mobility  Outcome Measure Help needed turning from your back to your side while in a flat bed without using bedrails?: None Help needed moving from lying on your back to sitting on the side of a flat bed without using bedrails?: None Help needed moving to and from a bed to a chair (including a wheelchair)?: A Little Help needed standing up from a chair using your arms (e.g., wheelchair or bedside chair)?: A Little Help needed to walk in hospital room?: A Little Help needed climbing 3-5 steps with a railing? : A Little 6 Click Score: 20    End of Session Equipment Utilized During Treatment: Gait belt Activity Tolerance: Patient tolerated treatment well Patient left: Other (comment)(Pt taken for imaging at end of session) Nurse Communication: Mobility status PT Visit Diagnosis:  Unsteadiness on feet (R26.81);History of falling (Z91.81);Difficulty in walking, not elsewhere classified (R26.2)    Time: 3267-1245 PT Time Calculation (min) (ACUTE ONLY): 28 min   Charges:   PT Evaluation $PT Eval Moderate Complexity: 1 Mod          D. Scott Kimiya Brunelle PT, DPT 04/16/20, 3:52 PM

## 2020-04-16 NOTE — Progress Notes (Addendum)
SLP Cancellation Note  Patient Details Name: Melinda Preston MRN: [id number] DOB: 05/08/1928   Cancelled treatment:       Reason Eval/Treat Not Completed: (chart reviewed). Received order; noted MRI results indicating subacute subdural hematomas w/ mild mass effect; chronic issues. Will await Neurology consult/assessment. Noted report of occasional word finding difficulties, but pt is able to converse w/ others and follow directions.     Jerilynn Som, MS, CCC-SLP Melinda Preston 04/16/2020, 8:40 AM

## 2020-04-16 NOTE — ED Notes (Signed)
Pt ambulatory with walker and gait belt with physical therapy.

## 2020-04-16 NOTE — ED Notes (Signed)
Pt transported to CT ?

## 2020-04-17 DIAGNOSIS — I10 Essential (primary) hypertension: Secondary | ICD-10-CM | POA: Diagnosis not present

## 2020-04-17 DIAGNOSIS — R29818 Other symptoms and signs involving the nervous system: Secondary | ICD-10-CM | POA: Diagnosis not present

## 2020-04-17 LAB — LIPID PANEL
Cholesterol: 155 mg/dL (ref 0–200)
HDL: 39 mg/dL — ABNORMAL LOW (ref >40)
LDL Cholesterol: 95 mg/dL (ref 0–99)
Total CHOL/HDL Ratio: 4 RATIO
Triglycerides: 105 mg/dL (ref <150)
VLDL: 21 mg/dL (ref 0–40)

## 2020-04-17 LAB — HEMOGLOBIN A1C
Hgb A1c MFr Bld: 6.5 % — ABNORMAL HIGH (ref 4.8–5.6)
Mean Plasma Glucose: 139.85 mg/dL

## 2020-04-17 MED ORDER — DEXAMETHASONE 0.5 MG PO TABS
0.5000 mg | ORAL_TABLET | Freq: Two times a day (BID) | ORAL | 0 refills | Status: AC
Start: 2020-04-17 — End: 2020-05-01

## 2020-04-17 MED ORDER — LEVETIRACETAM 250 MG PO TABS: 250.0000 mg | ORAL_TABLET | Freq: Two times a day (BID) | ORAL | 1 refills | Status: DC

## 2020-04-17 NOTE — Discharge Instructions (Signed)
NO driving F/u Neurosurgery in July on your scheduled appt NO blood thinners

## 2020-04-17 NOTE — Progress Notes (Signed)
SLP Cancellation Note  Patient Details Name: Melinda Preston MRN: [id number] DOB: Oct 20, 1928   Cancelled treatment:       Reason Eval/Treat Not Completed: (chart reviewed; consulted NSG then met w/ pt/family ). Upon entering room, NSG was reviewing Discharge paperwork; pt is  d/c'ing home at this time. Briefly screened pt then spoke w/ both pt/family member re: POC. Pt denied any difficulty swallowing and is currently on a regular diet; tolerates swallowing pills w/ water per NSG. Pt conversed in conversation w/ NSG, family member, and SLP w/out overt deficits noted; though engagement was brief. Pt and family denied the acute speech issues noted at admission involving occasional word finding difficulties; pt denied having any speech-language deficits currently. Encouraged pt to return home and w/ family monitoring, and assess if there are any New changes in communication function during her normal ADLs at home. Recommend f/u w/ PCP for referral to [facility] services if indicated at that time. Also recommended family oversee any New medications/regimen initially upon returning home for pt support as this is new information for pt; encouraged discussing medications together. Pt and family agreed.   No Acute skilled ST services indicated as pt appears close to/at her baseline but encouraged f/u post discharge if needed; pt was also in the process of discharging w/ NSG. Pt agreed.     Orinda Kenner, MS, CCC-SLP [facility] [place] 04/17/2020, 3:10 PM

## 2020-04-17 NOTE — Progress Notes (Signed)
Subjective: sleepy today. EEG consistent with rhythm that is seen post SDH no seizure activity.    Past Medical History:  Diagnosis Date  . Anxiety   . Aortic heart valve narrowing   . Atrial fibrillation (HCC)   . Diabetes mellitus without complication (HCC)   . Disorder of aorta (HCC)   . Hyperlipidemia   . Hypertension   . Insomnia   . Overactive bladder     Past Surgical History:  Procedure Laterality Date  . APPENDECTOMY    . rotator cuff surgery Left     Family History  Problem Relation Age of Onset  . Heart disease Father   . Heart disease Brother   . Diabetes Son   . Breast cancer Sister     Social History:  reports that she has never smoked. She has never used smokeless tobacco. She reports that she does not drink alcohol or use drugs.  Allergies  Allergen Reactions  . Lisinopril Cough    Medications: I have reviewed the patient's current medications.  ROS: History obtained from the patient  General ROS: negative for - chills, fatigue, fever, night sweats, weight gain or weight loss Psychological ROS: negative for - behavioral disorder, hallucinations, memory difficulties, mood swings or suicidal ideation Ophthalmic ROS: negative for - blurry vision, double vision, eye pain or loss of vision ENT ROS: negative for - epistaxis, nasal discharge, oral lesions, sore throat, tinnitus or vertigo Allergy and Immunology ROS: negative for - hives or itchy/watery eyes Hematological and Lymphatic ROS: negative for - bleeding problems, bruising or swollen lymph nodes Endocrine ROS: negative for - galactorrhea, hair pattern changes, polydipsia/polyuria or temperature intolerance Respiratory ROS: negative for - cough, hemoptysis, shortness of breath or wheezing Cardiovascular ROS: negative for - chest pain, dyspnea on exertion, edema or irregular heartbeat Gastrointestinal ROS: negative for - abdominal pain, diarrhea, hematemesis, nausea/vomiting or stool  incontinence Genito-Urinary ROS: negative for - dysuria, hematuria, incontinence or urinary frequency/urgency Musculoskeletal ROS: negative for - joint swelling or muscular weakness Neurological ROS: as noted in HPI Dermatological ROS: negative for rash and skin lesion changes  Physical Examination: Blood pressure (!) 135/95, pulse 80, temperature (!) 97.5 F (36.4 C), temperature source Oral, resp. rate 19, height 5\' 4"  (1.626 m), weight 70.9 kg, SpO2 98 %.    Neurological Examination   Mental Status: Alert, oriented, thought content appropriate.  Speech fluent without evidence of aphasia.  Able to follow 3 step commands without difficulty. Cranial Nerves: II: Discs flat bilaterally; Visual fields grossly normal, pupils equal, round, reactive to light and accommodation III,IV, VI: ptosis not present, extra-ocular motions intact bilaterally V,VII: smile symmetric, facial light touch sensation normal bilaterally VIII: hearing normal bilaterally IX,X: gag reflex present XI: bilateral shoulder shrug XII: midline tongue extension Motor: Right : Upper extremity   5/5    Left:     Upper extremity   5/5  Lower extremity   4/5     Lower extremity   4/5 Tone and bulk:normal tone throughout; no atrophy noted Sensory: Pinprick and light touch intact throughout, bilaterally Deep Tendon Reflexes: 1+ and symmetric throughout Plantars: Right: downgoing   Left: downgoing Cerebellar: normal finger-to-nose      Laboratory Studies:   Basic Metabolic Panel: Recent Labs  Lab 04/15/20 2009  NA 138  K 3.6  CL 99  CO2 27  GLUCOSE 137*  BUN 22  CREATININE 0.89  CALCIUM 9.3    Liver Function Tests: No results for input(s): AST, ALT, ALKPHOS, BILITOT, PROT,  ALBUMIN in the last 168 hours. No results for input(s): LIPASE, AMYLASE in the last 168 hours. No results for input(s): AMMONIA in the last 168 hours.  CBC: Recent Labs  Lab 04/15/20 03-18-08  WBC 9.8  NEUTROABS 7.4  HGB 14.2  HCT  43.7  MCV 79.2*  PLT 338    Cardiac Enzymes: No results for input(s): CKTOTAL, CKMB, CKMBINDEX, TROPONINI in the last 168 hours.  BNP: Invalid input(s): POCBNP  CBG: No results for input(s): GLUCAP in the last 168 hours.  Microbiology: Results for orders placed or performed during the hospital encounter of 04/16/20  SARS Coronavirus 2 by RT PCR (hospital order, performed in [facility] hospital lab) Nasopharyngeal Nasopharyngeal Swab     Status: None   Collection Time: 04/16/20  6:07 AM   Specimen: Nasopharyngeal Swab  Result Value Ref Range Status   SARS Coronavirus 2 NEGATIVE NEGATIVE Final    Comment: (NOTE) SARS-CoV-2 target nucleic acids are NOT DETECTED. The SARS-CoV-2 RNA is generally detectable in upper and lower respiratory specimens during the acute phase of infection. The lowest concentration of SARS-CoV-2 viral copies this assay can detect is 250 copies / mL. A negative result does not preclude SARS-CoV-2 infection and should not be used as the sole basis for treatment or other patient management decisions.  A negative result may occur with improper specimen collection / handling, submission of specimen other than nasopharyngeal swab, presence of viral mutation(s) within the areas targeted by this assay, and inadequate number of viral copies (<250 copies / mL). A negative result must be combined with clinical observations, patient history, and epidemiological information. Fact Sheet for Patients:   StrictlyIdeas.no Fact Sheet for Healthcare Providers: BankingDealers.co.za This test is not yet approved or cleared  by the [place] FDA and has been authorized for detection and/or diagnosis of SARS-CoV-2 by FDA under an Emergency Use Authorization (EUA).  This EUA will remain in effect (meaning this test can be used) for the duration of the COVID-19 declaration under Section 564(b)(1) of the Act, 21 U.S.C. section  360bbb-3(b)(1), unless the authorization is terminated or revoked sooner. Performed at [facility], [address] [place], [place] [address]     Coagulation Studies: Recent Labs    04/15/20 03-18-30  LABPROT 13.7  INR 1.1    Urinalysis: No results for input(s): COLORURINE, LABSPEC, PHURINE, GLUCOSEU, HGBUR, BILIRUBINUR, KETONESUR, PROTEINUR, UROBILINOGEN, NITRITE, LEUKOCYTESUR in the last 168 hours.  Invalid input(s): APPERANCEUR  Lipid Panel:     Component Value Date/Time   CHOL 155 04/17/2020 0415   CHOL 155 06/05/2019 1029   CHOL 162 12/22/2015 1440   TRIG 105 04/17/2020 0415   TRIG 137 12/22/2015 1440   HDL 39 (L) 04/17/2020 0415   HDL 54 06/05/2019 1029   CHOLHDL 4.0 04/17/2020 0415   VLDL 21 04/17/2020 0415   VLDL 27 12/22/2015 1440   LDLCALC 95 04/17/2020 0415   LDLCALC 77 06/05/2019 1029    HgbA1C:  Lab Results  Component Value Date   HGBA1C 6.3 (H) 08/25/2018    Urine Drug Screen:  No results found for: LABOPIA, COCAINSCRNUR, LABBENZ, AMPHETMU, THCU, LABBARB  Alcohol Level: No results for input(s): ETH in the last 168 hours.  Other results: EKG: normal EKG, normal sinus rhythm, unchanged from previous tracings.  Imaging: EEG  Result Date: 04/16/2020 Lora Havens, MD     04/16/2020  6:41 PM Patient Name: Melinda Preston MRN: [id number] Epilepsy Attending: Lora Havens Referring Physician/Provider: Dr Judd Gaudier  Date: 04/16/2020 Duration: 26.28 mins Patient history: 86 y.o. female with medical history significant ofrecent SDH due to fall  who presents with difficulty speaking, right hand numbness and dizziness. EEG to evaluate for seizure Level of alertness: Awake AEDs during EEG study: Keppra Technical aspects: This EEG study was done with scalp electrodes positioned according to the 10-20 International system of electrode placement. Electrical activity was acquired at a sampling rate of 500Hz  and reviewed with a high frequency filter  of 70Hz  and a low frequency filter of 1Hz . EEG data were recorded continuously and digitally stored. Description: The posterior dominant rhythm consists of 8-9 Hz activity of moderate voltage (25-35 uV) seen predominantly in posterior head regions, asymmetric ( L<R) and reactive to eye opening and eye closing. EEG showed continuous 3 to 6 Hz theta-delta slowing in left hemisphere, maximal left frontotemporal region. Hyperventilation and photic stimulation were not performed.   ABNORMALITY -Continuous slow, lateralized left, maximal  left frontotemporal region -Background asymmetry, left <right IMPRESSION: This study is suggestive of cortical dysfunction in left hemisphere, maximal left frontotemporal region likely secondary to underlying SDH. No seizures or epileptiform discharges were seen throughout the recording. Melinda Preston   CT HEAD WO CONTRAST  Result Date: 04/16/2020 CLINICAL DATA:  Subdural hematoma EXAM: CT HEAD WITHOUT CONTRAST TECHNIQUE: Contiguous axial images were obtained from the base of the skull through the vertex without intravenous contrast. COMPARISON:  April 15, 2020 FINDINGS: Brain: Age related volume loss is stable. The previously noted left-sided subdural hematoma which involves portions of the left frontal, upper temporal, and parietal regions is again noted with what appears to be primarily subacute blood products. The maximum thickness of this subdural hematoma is noted at the level of the mid superior lateral ventricles and is measured at 7 mm, stable. Mild localized mass effect noted. No midline shift. No new extra-axial fluid is evident. There is no intra-axial hemorrhage or mass. There is patchy small vessel disease in the centra semiovale, stable. Vascular: No hyperdense vessel. There is calcification in the distal left vertebral artery and in each carotid siphon region. Skull: Bony calvarium appears intact. Sinuses/Orbits: There is mild mucosal thickening and opacification in  several ethmoid air cells. Other visualized paranasal sinuses are clear. Visualized orbits appear symmetric bilaterally. Other: Mastoid air cells are clear. IMPRESSION: Stable left-sided subacute appearing subdural hematoma with mild mass effect but no midline shift. No new extra-axial fluid. No intra-axial hemorrhage. There is stable age related volume loss with patchy periventricular small vessel disease. No acute infarct evident. Foci of arterial vascular calcification noted. Mild ethmoid paranasal sinus disease noted. Electronically Signed   By: Bretta BangWilliam  Woodruff III M.D.   On: 04/16/2020 15:54   CT HEAD WO CONTRAST  Result Date: 04/15/2020 CLINICAL DATA:  New speech changes over the past 2 days. History of subdural hematoma. EXAM: CT HEAD WITHOUT CONTRAST TECHNIQUE: Contiguous axial images were obtained from the base of the skull through the vertex without intravenous contrast. COMPARISON:  04/04/2020 FINDINGS: Brain: Expected cerebral and cerebellar volume loss for age. The previously described left-sided subdural collection is again identified. Currently low-density, consistent with evolution of blood products. Maximally 6 mm on 13/3 anteriorly. On the order of 4 mm at the same level on the prior exam (when remeasured). No significant mass effect or midline shift. No acute infarct, intra-axial fluid collection, or hydrocephalus. Vascular: Intracranial atherosclerosis. Skull: No significant soft tissue swelling.  No skull fracture. Sinuses/Orbits: Normal imaged portions of the orbits and globes. Clear  paranasal sinuses and mastoid air cells. Other: None. IMPRESSION: 1. Since the CT of 04/04/2020, minimal increase in subdural collection along the left cerebral convexity. This is currently hypoattenuating, consistent with evolving blood products. No significant mass effect or midline shift. 2. No evidence of other acute superimposed process. Electronically Signed   By: Jeronimo Greaves M.D.   On: 04/15/2020 20:45    MR BRAIN WO CONTRAST  Result Date: 04/16/2020 CLINICAL DATA:  Initial evaluation for subdural hematoma. EXAM: MRI HEAD WITHOUT CONTRAST TECHNIQUE: Multiplanar, multiecho pulse sequences of the brain and surrounding structures were obtained without intravenous contrast. COMPARISON:  Prior head CT from 04/15/2020. FINDINGS: Brain: Generalized age-related cerebral atrophy. Patchy T2/FLAIR hyperintensity within the periventricular deep white matter both cerebral hemispheres most consistent with chronic small vessel ischemic disease, moderate in nature. No abnormal foci of restricted diffusion to suggest acute or subacute ischemia. Gray-white matter differentiation maintained. No encephalomalacia to suggest chronic cortical infarction. No acute intracranial hemorrhage within the brain itself. Few scattered chronic micro hemorrhages noted clustered about the splenium and deep gray nuclei, likely related to underlying hypertension. No mass lesion. Subacute left holo hemispheric subdural hematoma again seen, measuring up to 8 mm in maximal thickness at the level of the left frontal lobe. Mild mass effect on the subjacent left cerebral hemisphere without significant midline shift. Additional trace right-sided subdural hematoma is seen overlying the right frontotemporal convexity, measuring no more than 2 mm in maximal thickness without associated mass effect. This is also subacute in appearance. No midline shift or hydrocephalus. Pituitary gland suprasellar region normal. Midline structures intact. Vascular: Major intracranial vascular flow voids are maintained. Skull and upper cervical spine: Craniocervical junction within normal limits. Upper cervical spine normal. Bone marrow signal intensity within normal limits. No scalp soft tissue abnormality. Sinuses/Orbits: Patient status post bilateral ocular lens replacement. Globes and orbital soft tissues demonstrate no acute finding. Tiny left maxillary sinus retention  cyst noted. Paranasal sinuses otherwise largely clear. Trace left mastoid effusion noted. Inner ear structures grossly normal. Other: None. IMPRESSION: 1. 8 mm subacute left holo hemispheric subdural hematoma with mild mass effect on the subjacent left cerebral hemisphere without significant midline shift. 2. Additional trace 2 mm subacute right frontotemporal subdural hematoma without associated mass effect. 3. Underlying age-related cerebral atrophy with moderate chronic small vessel ischemic disease. No other acute intracranial abnormality. Electronically Signed   By: Rise Mu M.D.   On: 04/16/2020 04:26   US Carotid Bilateral (at [facility] and [facility] only)  Result Date: 04/16/2020 CLINICAL DATA:  Stroke-like symptoms EXAM: BILATERAL CAROTID DUPLEX ULTRASOUND TECHNIQUE: Wallace Cullens scale imaging, color Doppler and duplex ultrasound were performed of bilateral carotid and vertebral arteries in the neck. COMPARISON:  None. FINDINGS: Criteria: Quantification of carotid stenosis is based on velocity parameters that correlate the residual internal carotid diameter with NASCET-based stenosis levels, using the diameter of the distal internal carotid lumen as the denominator for stenosis measurement. The following velocity measurements were obtained: RIGHT ICA: 38/13 cm/sec CCA: 39/6 cm/sec SYSTOLIC ICA/CCA RATIO:  1.0 ECA:  19 cm/sec LEFT ICA: 59/16 cm/sec CCA: 21/6 cm/sec SYSTOLIC ICA/CCA RATIO:  2.8 ECA:  38 cm/sec RIGHT CAROTID ARTERY: No significant atherosclerotic plaque or evidence of stenosis in the proximal internal carotid artery. RIGHT VERTEBRAL ARTERY:  Patent with normal antegrade flow. LEFT CAROTID ARTERY: Disuse no significant atherosclerotic plaque or evidence of stenosis in the proximal internal carotid artery. LEFT VERTEBRAL ARTERY:  Patent with normal antegrade flow. IMPRESSION: 1. No significant atherosclerotic plaque or  stenosis in either internal carotid artery. 2. Minimal heterogeneous  atherosclerotic plaque in the right distal common carotid artery without stenosis. 3. Vertebral arteries are patent with antegrade flow. Signed, Sterling Big, MD, RPVI [facility] Radiology Electronically Signed   By: Malachy Moan M.D.   On: 04/16/2020 12:45     Assessment/Plan:  86 y.o. female  with medical history significant of recent SDH due to fall, hypertension, hyperlipidemia, diet-controlled diabetes, GERD, depression, anxiety, atrial fibrillation (Eliquis on hold), sCHF with EF of 35%, who presents with difficulty speaking, right hand numbness and dizziness. Recent evaluation at [facility] on 5/21 post fall with SDH with Kcentra administration. 5/26 evaluation with stable SDH for headaches.   Presented now with difficulty speaking, difficulty finding words and stuttering. Improved and appears at baseline right now.    - MRI brain with L SDH of and 78mm R frontoparietal 66mm sdh - no significant shift. Not convinced surgical candidate but Neurosurgery following - Agree with continuation Keppra 500 BID for total of 7 days  - EEG no seizures - lives with son.  - pt/ot - likely case management since increased falls and possibly needs more assistance at home.  - call if any questions.   04/17/2020, 10:19 AM

## 2020-04-17 NOTE — Progress Notes (Addendum)
Patient moved to Room 247 d/t toilet issue.  Patient AOx3 and answered all NIHSS questions correctly. Disoriented to place but easy to reorient. NO motor function deficits noted. Pleasant disposition. Gave Tylenol PRN per Pt requests. She usually takes Tylenol at home to rest at night. Frequently moving around in bed throughout the night.

## 2020-04-17 NOTE — Discharge Summary (Signed)
[facility] [place] [facility]   PATIENT NAME: Melinda Preston    MR#:  [id number]  DATE OF BIRTH:  13-Sep-1928  DATE OF ADMISSION:  04/16/2020 ADMITTING PHYSICIAN: Lorretta Harp, MD  DATE OF DISCHARGE: 04/17/2020  PRIMARY CARE PHYSICIAN: Marjie Skiff, NP    ADMISSION DIAGNOSIS:  Aphasia [R47.01] Stroke (cerebrum) (HCC) [I63.9] Chronic a-fib (HCC) [I48.20] Subacute subdural hematoma (HCC) [S06.5X9A] Acute focal neurological deficit [R29.818]  DISCHARGE DIAGNOSIS:  Subacute Subdural hematoma (known)  SECONDARY DIAGNOSIS:   Past Medical History:  Diagnosis Date  . Anxiety   . Aortic heart valve narrowing   . Atrial fibrillation (HCC)   . Diabetes mellitus without complication (HCC)   . Disorder of aorta (HCC)   . Hyperlipidemia   . Hypertension   . Insomnia   . Overactive bladder     HOSPITAL COURSE:  Melinda Preston is a 86 y.o. female with medical history significant of recent SDH due to fall, hypertension, hyperlipidemia, diet-controlled diabetes, GERD, depression, anxiety, atrial fibrillation (Eliquis on hold), sCHF with EF of 35%, who presents with difficulty speaking, right hand numbness and dizziness.   Acute focal neurological deficit: pt has difficulty speaking, right hand numbness and dizziness--improved - MRI is negative for ischemic stroke, but showed MRI brain with LSDHof  frontoparietal .  -Dr. Myer Haff of neurosurgery is consulted, he recommended starting patient with Keppra and Decadron.   -Will treat the patient with 500 mg of Keppra twice daily and 1 mg of Decadron twice daily in hospital, but the dose needs to be decreased to Keppra 250 mg twice daily and Decadron 0.5 mg twice daily for remainder of 2 weeks after discharge per neurosurgeon.   -Dr. Loretha Brasil of neurology is consulted, who agreed to treat patient with Keppra. -EEG does not show any seizures -PT/OT--out pt PT -hold eliquis--family  aware  History of subdural hematoma 03/28/20 - see above  Hypercholesterolemia -lipitor  Essential hypertension: -As needed hydralazine IV -Coreg, Cozaar, Cardizem  Depression -Zoloft  Chronic atrial fibrillation (HCC) -Hold Eliquis -Cardizem and Coreg  GERD without esophagitis -Protonix  Chronic systolic CHF (congestive heart failure) (HCC): 2D echo on 03/17/2020 showed EF of 35%.  Patient does not have leg edema JVD.  No shortness of breath.  CHF is compensated. -Check BMP -continue home Lasix  overall stable hemodynamically      DVT ppx: SCD Code Status: Full code Family Communication:   Yes, patient's daughter-in-law   at bed side Disposition Plan:  Anticipate discharge back to previous environment Consults called:  neurosurgeon, Dr. Myer Haff and Dr. Loretha Brasil for neurology are consulted. Admission status: progressive unit for obs      Status is: Observation  Dispo: The patient is from: Home  Anticipated d/c is to: Home  Anticipated d/c date is: 6/3  Patient currently is medically stable to d/c at present       CONSULTS OBTAINED:  Treatment Team:  Pauletta Browns, MD  DRUG ALLERGIES:   Allergies  Allergen Reactions  . Lisinopril Cough    DISCHARGE MEDICATIONS:   Allergies as of 04/17/2020      Reactions   Lisinopril Cough      Medication List    STOP taking these medications   apixaban 2.5 MG Tabs tablet Commonly known as: Eliquis   aspirin 81 MG tablet     TAKE these medications   acetaminophen 650 MG CR tablet Commonly known as: TYLENOL Take 650 mg by mouth every 8 (eight) hours as needed  for pain.   atorvastatin 20 MG tablet Commonly known as: LIPITOR Take 1 tablet (20 mg total) by mouth at bedtime.   CALCIUM 600+D3 PO Take 1 tablet by mouth daily.   carvedilol 3.125 MG tablet Commonly known as: COREG Take 1 tablet (3.125 mg total) by mouth 2 (two) times daily  with a meal.   dexamethasone 0.5 MG tablet Commonly known as: DECADRON Take 1 tablet (0.5 mg total) by mouth 2 (two) times daily for 14 days.   diltiazem 180 MG 24 hr capsule Commonly known as: CARDIZEM CD TAKE 1 CAPSULE(180 MG) BY MOUTH DAILY What changed: See the new instructions.   Fish Oil 1000 MG Caps Take 1,000 mg by mouth daily.   furosemide 40 MG tablet Commonly known as: LASIX Take 40 mg by mouth 2 (two) times daily.   levETIRAcetam 250 MG tablet Commonly known as: KEPPRA Take 1 tablet (250 mg total) by mouth 2 (two) times daily.   losartan 25 MG tablet Commonly known as: COZAAR TAKE 1 TABLET(25 MG) BY MOUTH DAILY What changed: See the new instructions.   omeprazole 20 MG capsule Commonly known as: PRILOSEC TAKE 1 CAPSULE(20 MG) BY MOUTH DAILY What changed: See the new instructions.   potassium chloride SA 20 MEQ tablet Commonly known as: KLOR-CON TAKE 1 TABLET(20 MEQ) BY MOUTH DAILY What changed:   how much to take  how to take this  when to take this  additional instructions   sertraline 25 MG tablet Commonly known as: ZOLOFT TAKE 1 TABLET(25 MG) BY MOUTH DAILY What changed: See the new instructions.   traZODone 50 MG tablet Commonly known as: DESYREL TAKE 1 TABLET(50 MG) BY MOUTH AT BEDTIME What changed: See the new instructions.   VITAMIN D (CHOLECALCIFEROL) PO Take 600 Units by mouth daily.            Durable Medical Equipment  (From admission, onward)         Start     Ordered   04/17/20 1315  For home use only DME Walker rolling  Once    Question Answer Comment  Walker: With 5 Inch Wheels   Patient needs a walker to treat with the following condition General weakness      04/17/20 1314          If you experience worsening of your admission symptoms, develop shortness of breath, life threatening emergency, suicidal or homicidal thoughts you must seek medical attention immediately by calling 911 or calling your MD  immediately  if symptoms less severe.  You Must read complete instructions/literature along with all the possible adverse reactions/side effects for all the Medicines you take and that have been prescribed to you. Take any new Medicines after you have completely understood and accept all the possible adverse reactions/side effects.   Please note  You were cared for by a hospitalist during your hospital stay. If you have any questions about your discharge medications or the care you received while you were in the hospital after you are discharged, you can call the unit and asked to speak with the hospitalist on call if the hospitalist that took care of you is not available. Once you are discharged, your primary care physician will handle any further medical issues. Please note that NO REFILLS for any discharge medications will be authorized once you are discharged, as it is imperative that you return to your primary care physician (or establish a relationship with a primary care physician if you do not  have one) for your aftercare needs so that they can reassess your need for medications and monitor your lab values. Today   SUBJECTIVE  DIL in room Sitting and eating lunch. No numbness. Worked with PT yday well   VITAL SIGNS:  Blood pressure 133/76, pulse 88, temperature (!) 97.5 F (36.4 C), temperature source Oral, resp. rate 19, height 5\' 4"  (1.626 m), weight 70.9 kg, SpO2 97 %.  I/O:    Intake/Output Summary (Last 24 hours) at 04/17/2020 1333 Last data filed at 04/17/2020 0241 Gross per 24 hour  Intake --  Output 300 ml  Net -300 ml    PHYSICAL EXAMINATION:  GENERAL:  86 y.o.-year-old patient lying in the bed with no acute distress.  EYES: Pupils equal, round, reactive to light and accommodation. No scleral icterus.  HEENT: Head atraumatic, normocephalic. Oropharynx and nasopharynx clear.  NECK:  Supple, no jugular venous distention. No thyroid enlargement, no tenderness.  LUNGS:  Normal breath sounds bilaterally, no wheezing, rales,rhonchi or crepitation. No use of accessory muscles of respiration.  CARDIOVASCULAR: S1, S2 normal. No murmurs, rubs, or gallops.  ABDOMEN: Soft, non-tender, non-distended. Bowel sounds present. No organomegaly or mass.  EXTREMITIES: No pedal edema, cyanosis, or clubbing.  NEUROLOGIC: Cranial nerves II through XII are intact. Muscle strength 5/5 in all extremities. Sensation intact. Gait not checked.  PSYCHIATRIC: patient is alert and awake SKIN: No obvious rash, lesion, or ulcer.   DATA REVIEW:   CBC  Recent Labs  Lab 04/15/20 2009  WBC 9.8  HGB 14.2  HCT 43.7  PLT 338    Chemistries  Recent Labs  Lab 04/15/20 2009  NA 138  K 3.6  CL 99  CO2 27  GLUCOSE 137*  BUN 22  CREATININE 0.89  CALCIUM 9.3    Microbiology Results   Recent Results (from the past 240 hour(s))  SARS Coronavirus 2 by RT PCR (hospital order, performed in [facility]) Nasopharyngeal Nasopharyngeal Swab     Status: None   Collection Time: 04/16/20  6:07 AM   Specimen: Nasopharyngeal Swab  Result Value Ref Range Status   SARS Coronavirus 2 NEGATIVE NEGATIVE Final    Comment: (NOTE) SARS-CoV-2 target nucleic acids are NOT DETECTED. The SARS-CoV-2 RNA is generally detectable in upper and lower respiratory specimens during the acute phase of infection. The lowest concentration of SARS-CoV-2 viral copies this assay can detect is 250 copies / mL. A negative result does not preclude SARS-CoV-2 infection and should not be used as the sole basis for treatment or other patient management decisions.  A negative result may occur with improper specimen collection / handling, submission of specimen other than nasopharyngeal swab, presence of viral mutation(s) within the areas targeted by this assay, and inadequate number of viral copies (<250 copies / mL). A negative result must be combined with clinical observations, patient history, and  epidemiological information. Fact Sheet for Patients:   BoilerBrush.com.cyhttps://www.fda.gov/media/136312/download Fact Sheet for Healthcare Providers: https://pope.com/https://www.fda.gov/media/136313/download This test is not yet approved or cleared  by the [place] FDA and has been authorized for detection and/or diagnosis of SARS-CoV-2 by FDA under an Emergency Use Authorization (EUA).  This EUA will remain in effect (meaning this test can be used) for the duration of the COVID-19 declaration under Section 564(b)(1) of the Act, 21 U.S.C. section 360bbb-3(b)(1), unless the authorization is terminated or revoked sooner. Performed at [facility], [address] [place], [place] [address]     RADIOLOGY:  EEG  Result Date: 04/16/2020  Charlsie Quest, MD     04/16/2020  6:41 PM Patient Name: Serenitie Vinton MRN: [id number] Epilepsy Attending: Charlsie Quest Referring Physician/Provider: Dr Lindajo Royal Date: 04/16/2020 Duration: 26.28 mins Patient history: 86 y.o. female with medical history significant ofrecent SDH due to fall  who presents with difficulty speaking, right hand numbness and dizziness. EEG to evaluate for seizure Level of alertness: Awake AEDs during EEG study: Keppra Technical aspects: This EEG study was done with scalp electrodes positioned according to the 10-20 International system of electrode placement. Electrical activity was acquired at a sampling rate of 500Hz  and reviewed with a high frequency filter of 70Hz  and a low frequency filter of 1Hz . EEG data were recorded continuously and digitally stored. Description: The posterior dominant rhythm consists of 8-9 Hz activity of moderate voltage (25-35 uV) seen predominantly in posterior head regions, asymmetric ( L<R) and reactive to eye opening and eye closing. EEG showed continuous 3 to 6 Hz theta-delta slowing in left hemisphere, maximal left frontotemporal region. Hyperventilation and photic stimulation were not performed.    ABNORMALITY -Continuous slow, lateralized left, maximal  left frontotemporal region -Background asymmetry, left <right IMPRESSION: This study is suggestive of cortical dysfunction in left hemisphere, maximal left frontotemporal region likely secondary to underlying SDH. No seizures or epileptiform discharges were seen throughout the recording. Priyanka   CT HEAD WO CONTRAST  Result Date: 04/16/2020 CLINICAL DATA:  Subdural hematoma EXAM: CT HEAD WITHOUT CONTRAST TECHNIQUE: Contiguous axial images were obtained from the base of the skull through the vertex without intravenous contrast. COMPARISON:  April 15, 2020 FINDINGS: Brain: Age related volume loss is stable. The previously noted left-sided subdural hematoma which involves portions of the left frontal, upper temporal, and parietal regions is again noted with what appears to be primarily subacute blood products. The maximum thickness of this subdural hematoma is noted at the level of the mid superior lateral ventricles and is measured at 7 mm, stable. Mild localized mass effect noted. No midline shift. No new extra-axial fluid is evident. There is no intra-axial hemorrhage or mass. There is patchy small vessel disease in the centra semiovale, stable. Vascular: No hyperdense vessel. There is calcification in the distal left vertebral artery and in each carotid siphon region. Skull: Bony calvarium appears intact. Sinuses/Orbits: There is mild mucosal thickening and opacification in several ethmoid air cells. Other visualized paranasal sinuses are clear. Visualized orbits appear symmetric bilaterally. Other: Mastoid air cells are clear. IMPRESSION: Stable left-sided subacute appearing subdural hematoma with mild mass effect but no midline shift. No new extra-axial fluid. No intra-axial hemorrhage. There is stable age related volume loss with patchy periventricular small vessel disease. No acute infarct evident. Foci of arterial vascular calcification noted.  Mild ethmoid paranasal sinus disease noted. Electronically Signed   By: Annabelle Harman III M.D.   On: 04/16/2020 15:54   CT HEAD WO CONTRAST  Result Date: 04/15/2020 CLINICAL DATA:  New speech changes over the past 2 days. History of subdural hematoma. EXAM: CT HEAD WITHOUT CONTRAST TECHNIQUE: Contiguous axial images were obtained from the base of the skull through the vertex without intravenous contrast. COMPARISON:  04/04/2020 FINDINGS: Brain: Expected cerebral and cerebellar volume loss for age. The previously described left-sided subdural collection is again identified. Currently low-density, consistent with evolution of blood products. Maximally 6 mm on 13/3 anteriorly. On the order of 4 mm at the same level on the prior exam (when remeasured). No significant mass effect or midline shift. No acute  infarct, intra-axial fluid collection, or hydrocephalus. Vascular: Intracranial atherosclerosis. Skull: No significant soft tissue swelling.  No skull fracture. Sinuses/Orbits: Normal imaged portions of the orbits and globes. Clear paranasal sinuses and mastoid air cells. Other: None. IMPRESSION: 1. Since the CT of 04/04/2020, minimal increase in subdural collection along the left cerebral convexity. This is currently hypoattenuating, consistent with evolving blood products. No significant mass effect or midline shift. 2. No evidence of other acute superimposed process. Electronically Signed   By: Jeronimo Greaves M.D.   On: 04/15/2020 20:45   MR BRAIN WO CONTRAST  Result Date: 04/16/2020 CLINICAL DATA:  Initial evaluation for subdural hematoma. EXAM: MRI HEAD WITHOUT CONTRAST TECHNIQUE: Multiplanar, multiecho pulse sequences of the brain and surrounding structures were obtained without intravenous contrast. COMPARISON:  Prior head CT from 04/15/2020. FINDINGS: Brain: Generalized age-related cerebral atrophy. Patchy T2/FLAIR hyperintensity within the periventricular deep white matter both cerebral hemispheres  most consistent with chronic small vessel ischemic disease, moderate in nature. No abnormal foci of restricted diffusion to suggest acute or subacute ischemia. Gray-white matter differentiation maintained. No encephalomalacia to suggest chronic cortical infarction. No acute intracranial hemorrhage within the brain itself. Few scattered chronic micro hemorrhages noted clustered about the splenium and deep gray nuclei, likely related to underlying hypertension. No mass lesion. Subacute left holo hemispheric subdural hematoma again seen, measuring up to 8 mm in maximal thickness at the level of the left frontal lobe. Mild mass effect on the subjacent left cerebral hemisphere without significant midline shift. Additional trace right-sided subdural hematoma is seen overlying the right frontotemporal convexity, measuring no more than 2 mm in maximal thickness without associated mass effect. This is also subacute in appearance. No midline shift or hydrocephalus. Pituitary gland suprasellar region normal. Midline structures intact. Vascular: Major intracranial vascular flow voids are maintained. Skull and upper cervical spine: Craniocervical junction within normal limits. Upper cervical spine normal. Bone marrow signal intensity within normal limits. No scalp soft tissue abnormality. Sinuses/Orbits: Patient status post bilateral ocular lens replacement. Globes and orbital soft tissues demonstrate no acute finding. Tiny left maxillary sinus retention cyst noted. Paranasal sinuses otherwise largely clear. Trace left mastoid effusion noted. Inner ear structures grossly normal. Other: None. IMPRESSION: 1. 8 mm subacute left holo hemispheric subdural hematoma with mild mass effect on the subjacent left cerebral hemisphere without significant midline shift. 2. Additional trace 2 mm subacute right frontotemporal subdural hematoma without associated mass effect. 3. Underlying age-related cerebral atrophy with moderate chronic small  vessel ischemic disease. No other acute intracranial abnormality. Electronically Signed   By: Rise Mu M.D.   On: 04/16/2020 04:26   US Carotid Bilateral (at [facility] and [facility] only)  Result Date: 04/16/2020 CLINICAL DATA:  Stroke-like symptoms EXAM: BILATERAL CAROTID DUPLEX ULTRASOUND TECHNIQUE: Wallace Cullens scale imaging, color Doppler and duplex ultrasound were performed of bilateral carotid and vertebral arteries in the neck. COMPARISON:  None. FINDINGS: Criteria: Quantification of carotid stenosis is based on velocity parameters that correlate the residual internal carotid diameter with NASCET-based stenosis levels, using the diameter of the distal internal carotid lumen as the denominator for stenosis measurement. The following velocity measurements were obtained: RIGHT ICA: 38/13 cm/sec CCA: 39/6 cm/sec SYSTOLIC ICA/CCA RATIO:  1.0 ECA:  19 cm/sec LEFT ICA: 59/16 cm/sec CCA: 21/6 cm/sec SYSTOLIC ICA/CCA RATIO:  2.8 ECA:  38 cm/sec RIGHT CAROTID ARTERY: No significant atherosclerotic plaque or evidence of stenosis in the proximal internal carotid artery. RIGHT VERTEBRAL ARTERY:  Patent with normal antegrade flow. LEFT CAROTID ARTERY: Disuse no  significant atherosclerotic plaque or evidence of stenosis in the proximal internal carotid artery. LEFT VERTEBRAL ARTERY:  Patent with normal antegrade flow. IMPRESSION: 1. No significant atherosclerotic plaque or stenosis in either internal carotid artery. 2. Minimal heterogeneous atherosclerotic plaque in the right distal common carotid artery without stenosis. 3. Vertebral arteries are patent with antegrade flow. Signed, Sterling Big, MD, RPVI [facility] Electronically Signed   By: Malachy Moan M.D.   On: 04/16/2020 12:45     CODE STATUS:     Code Status Orders  (From admission, onward)         Start     Ordered   04/16/20 0535  Full code  Continuous     04/16/20 0537         Code Status History    This patient has a current code status but no historical code status.   Advance Care Planning Activity    Advance Directive Documentation     Most Recent Value  Type of Advance Directive  -- [unsure]  Pre-existing out of facility DNR order (yellow form or pink MOST form)  --  "MOST" Form in Place?  --       TOTAL TIME TAKING CARE OF THIS PATIENT: *40* minutes.    Enedina Finner M.D  [facility]    CC: Primary care physician; Marjie Skiff, NP

## 2020-04-18 ENCOUNTER — Other Ambulatory Visit: Payer: Self-pay | Admitting: Neurosurgery

## 2020-04-18 DIAGNOSIS — S065XAA Traumatic subdural hemorrhage with loss of consciousness status unknown, initial encounter: Secondary | ICD-10-CM

## 2020-04-23 ENCOUNTER — Ambulatory Visit (INDEPENDENT_AMBULATORY_CARE_PROVIDER_SITE_OTHER): Payer: Medicare Other | Admitting: Nurse Practitioner

## 2020-04-23 ENCOUNTER — Encounter: Payer: Self-pay | Admitting: Nurse Practitioner

## 2020-04-23 ENCOUNTER — Other Ambulatory Visit: Payer: Self-pay

## 2020-04-23 VITALS — BP 134/78 | HR 84 | Temp 98.1°F | Wt 156.0 lb

## 2020-04-23 DIAGNOSIS — Z8679 Personal history of other diseases of the circulatory system: Secondary | ICD-10-CM

## 2020-04-23 DIAGNOSIS — Z9181 History of falling: Secondary | ICD-10-CM

## 2020-04-23 DIAGNOSIS — I10 Essential (primary) hypertension: Secondary | ICD-10-CM | POA: Diagnosis not present

## 2020-04-23 DIAGNOSIS — I482 Chronic atrial fibrillation, unspecified: Secondary | ICD-10-CM

## 2020-04-23 DIAGNOSIS — R7301 Impaired fasting glucose: Secondary | ICD-10-CM

## 2020-04-23 DIAGNOSIS — I5022 Chronic systolic (congestive) heart failure: Secondary | ICD-10-CM

## 2020-04-23 NOTE — Patient Instructions (Signed)

## 2020-04-23 NOTE — Assessment & Plan Note (Signed)
Recent A1C in hospital 6.5%, goal for advanced age is <8 and she is a high fall risk.  She wishes to continue to focus on healthy diet and quality of life.  This is appropriate due to advanced age.

## 2020-04-23 NOTE — Assessment & Plan Note (Signed)
Continue collaboration with neurology, has repeat scan end of month and follow-up with them in July.  Neuro exam stable today, with exception of some difficulty with memory.  Continue current medication regimen, to stop Keppra and Decadron on June 17th.  Will place home PT referral to help with strengthening and fall prevention.  She is aware she is not to drive.  Labs CBC and BMP today.  Return in 9 weeks or sooner if any worsening symptoms.

## 2020-04-23 NOTE — Assessment & Plan Note (Signed)
Chronic, ongoing.  Continue current medication regimen and collaboration with cardiology.   - Reminded to call for an overnight weight gain of >2 pounds or a weekly weight weight of >5 pounds - not adding salt to his food and has been reading food labels. Reviewed the importance of keeping daily sodium intake to <2000mg daily  

## 2020-04-23 NOTE — Assessment & Plan Note (Signed)
Home Health PT referral placed

## 2020-04-23 NOTE — Assessment & Plan Note (Signed)
Chronic, stable with numbers on repeat at goal today for her age and at home.  Continue current medication regimen and collaboration with cardiology.  Monitor for hypotension and falls, reduce medication as needed.  BMP today.

## 2020-04-23 NOTE — Assessment & Plan Note (Signed)
Chronic, ongoing.  Continue current medication regimen and collaboration with cardiology.  Continue to hold Eliquis at this time until further assessment by neurology.  High fall risk and recent subdural hematoma.

## 2020-04-23 NOTE — Progress Notes (Signed)
BP 134/78 (BP Location: Left Arm)   Pulse 84   Temp 98.1 F (36.7 C) (Oral)   Wt 156 lb (70.8 kg)   LMP  (LMP Unknown)   SpO2 97%   BMI 26.78 kg/m    Subjective:    Patient ID: Melinda Preston, female    DOB: 06/19/1928, 86 y.o.   MRN: [id number]  HPI: Melinda OreBlanche Ector Thoman is a 86 y.o. female  Chief Complaint  Patient presents with  . Hospitalization Follow-up   Transition of Care Hospital Follow up.  Had recent fall prior to hospitalization in May, prior to this had fall March.  Is to be seen on 30th for repeat CT scan and then sees neurology July 7th.  Does endorse some dizziness continues intermittently, with position changes and sometimes while walking.  Currently lives with her son, who offers good support.  Has walker at home, does not use.  Is using cane, but her daughter-in-law reports she carries it more than uses it.  Her daughter-in-law reports noticing some memory changes since the falls, will be talking about something and then drift off onto different subject.  They report they were told this is expected.  Discharged with Keppra and Decadron, which she is to stop on June 17th.  Last saw cardiology April 7th, 2021, Dr. Cassie FreerParachos.  Had echo in May 2021 noting EF 35%.  Had headache yesterday, took some Tylenol and this relieved it.  No vision changes, slurred speech, syncope, facial asymmetry, or seizures.    "[name] a 86 y.o.femalewith medical history significant ofrecent SDH due to fall,hypertension, hyperlipidemia, diet-controlled diabetes, GERD, depression, anxiety, atrial fibrillation (Eliquis on hold),sCHF with EF of 35%, who presents with difficulty speaking, right hand numbness and dizziness.   Acute focal neurological deficit: pt has difficulty speaking, right hand numbnessanddizziness--improved - MRI is negative forischemic stroke, but showedMRI brain with LSDHof 8MMand 2mmR frontoparietal 2mmSDH.  -Dr. Myer HaffYarbrough  ofneurosurgery is consulted, he recommended starting patient with Keppra and Decadron. -Willtreat the patient with 500 mg of Keppra twice daily and 1 mg of Decadron twice daily in hospital, butthedose needs to be decreased to Keppra 250 mg twice daily and Decadron 0.5 mg twice daily for remainder of 2 weeks after dischargeperneurosurgeon.  -Dr. Loretha BrasilZeylikman of neurology is consulted, who agreed to treat patient with Keppra. -EEG does not show any seizures -PT/OT--out pt PT -hold eliquis--family aware  History of subdural hematoma 03/28/20 - see above  Hypercholesterolemia -lipitor  Essential hypertension: -As needed hydralazine IV -Coreg, Cozaar, Cardizem  Depression -Zoloft  Chronic atrial fibrillation (HCC) -Hold Eliquis -Cardizem and Coreg  GERD without esophagitis -Protonix  Chronic systolic CHF (congestive heart failure) (HCC):2D echo on 03/17/2020 showed EF of 35%. Patient does not have leg edema JVD. No shortness of breath. CHF is compensated. -Check BMP -continue homeLasix  overall stable hemodynamically"  [facility] Physician: Dr. Allena KatzPatel D/C Date: 04/17/20  Records Requested: 04/23/20 Records Received: 04/23/20 Records Reviewed: 04/23/20  Diagnoses on Discharge:  Subacute Subdural hematoma (known)  Date of interactive Contact within 48 hours of discharge:  Contact was through: phone  Date of 7 day or 14 day face-to-face visit:    within 7 days  Outpatient Encounter Medications as of 04/23/2020  Medication Sig  . atorvastatin (LIPITOR) 20 MG tablet Take 1 tablet (20 mg total) by mouth at bedtime.  . Calcium Carb-Cholecalciferol (CALCIUM 600+D3 PO) Take 1 tablet by mouth daily.   . carvedilol (COREG) 3.125 MG tablet Take 1  tablet (3.125 mg total) by mouth 2 (two) times daily with a meal.  . dexamethasone (DECADRON) 0.5 MG tablet Take 1 tablet (0.5 mg total) by mouth 2 (two) times daily for 14 days.  Marland Kitchen diltiazem (CARDIZEM CD) 180 MG  24 hr capsule TAKE 1 CAPSULE(180 MG) BY MOUTH DAILY (Patient taking differently: Take 180 mg by mouth daily. )  . furosemide (LASIX) 40 MG tablet Take 40 mg by mouth 2 (two) times daily.  Marland Kitchen levETIRAcetam (KEPPRA) 250 MG tablet Take 1 tablet (250 mg total) by mouth 2 (two) times daily.  Marland Kitchen losartan (COZAAR) 25 MG tablet TAKE 1 TABLET(25 MG) BY MOUTH DAILY (Patient taking differently: Take 25 mg by mouth daily. )  . Omega-3 Fatty Acids (FISH OIL) 1000 MG CAPS Take 1,000 mg by mouth daily.  Marland Kitchen omeprazole (PRILOSEC) 20 MG capsule TAKE 1 CAPSULE(20 MG) BY MOUTH DAILY (Patient taking differently: Take 20 mg by mouth daily. )  . potassium chloride SA (KLOR-CON) 20 MEQ tablet TAKE 1 TABLET(20 MEQ) BY MOUTH DAILY (Patient taking differently: Take 20 mEq by mouth daily. )  . sertraline (ZOLOFT) 25 MG tablet TAKE 1 TABLET(25 MG) BY MOUTH DAILY (Patient taking differently: Take 25 mg by mouth daily. )  . traZODone (DESYREL) 50 MG tablet TAKE 1 TABLET(50 MG) BY MOUTH AT BEDTIME (Patient taking differently: Take 50 mg by mouth at bedtime. TAKE 1 TABLET(50 MG) BY MOUTH AT BEDTIME)  . VITAMIN D, CHOLECALCIFEROL, PO Take 600 Units by mouth daily.   . [DISCONTINUED] acetaminophen (TYLENOL) 650 MG CR tablet Take 650 mg by mouth every 8 (eight) hours as needed for pain.   No facility-administered encounter medications on file as of 04/23/2020.    Diagnostic Tests Reviewed/Disposition:   CLINICAL DATA:  Subdural hematoma  EXAM: CT HEAD WITHOUT CONTRAST  TECHNIQUE: Contiguous axial images were obtained from the base of the skull through the vertex without intravenous contrast.  COMPARISON:  April 15, 2020  FINDINGS: Brain: Age related volume loss is stable. The previously noted left-sided subdural hematoma which involves portions of the left frontal, upper temporal, and parietal regions is again noted with what appears to be primarily subacute blood products. The maximum thickness of this subdural hematoma  is noted at the level of the mid superior lateral ventricles and is measured at 7 mm, stable. Mild localized mass effect noted. No midline shift. No new extra-axial fluid is evident. There is no intra-axial hemorrhage or mass. There is patchy small vessel disease in the centra semiovale, stable.  Vascular: No hyperdense vessel. There is calcification in the distal left vertebral artery and in each carotid siphon region.  Skull: Bony calvarium appears intact.  Sinuses/Orbits: There is mild mucosal thickening and opacification in several ethmoid air cells. Other visualized paranasal sinuses are clear. Visualized orbits appear symmetric bilaterally.  Other: Mastoid air cells are clear.  IMPRESSION: Stable left-sided subacute appearing subdural hematoma with mild mass effect but no midline shift. No new extra-axial fluid. No intra-axial hemorrhage.  There is stable age related volume loss with patchy periventricular small vessel disease. No acute infarct evident.  Foci of arterial vascular calcification noted. Mild ethmoid paranasal sinus disease noted.   Electronically Signed   By: Bretta Bang III M.D.   On: 04/16/2020 15:54  CLINICAL DATA:  Stroke-like symptoms  EXAM: BILATERAL CAROTID DUPLEX ULTRASOUND  TECHNIQUE: Wallace Cullens scale imaging, color Doppler and duplex ultrasound were performed of bilateral carotid and vertebral arteries in the neck.  COMPARISON:  None.  FINDINGS:  Criteria: Quantification of carotid stenosis is based on velocity parameters that correlate the residual internal carotid diameter with NASCET-based stenosis levels, using the diameter of the distal internal carotid lumen as the denominator for stenosis measurement.  The following velocity measurements were obtained:  RIGHT ICA: 38/13 cm/sec CCA: 39/6 cm/sec  SYSTOLIC ICA/CCA RATIO:  1.0  ECA:  19 cm/sec  LEFT  ICA: 59/16 cm/sec  CCA: 21/6 cm/sec  SYSTOLIC  ICA/CCA RATIO:  2.8  ECA:  38 cm/sec  RIGHT CAROTID ARTERY: No significant atherosclerotic plaque or evidence of stenosis in the proximal internal carotid artery.  RIGHT VERTEBRAL ARTERY:  Patent with normal antegrade flow.  LEFT CAROTID ARTERY: Disuse no significant atherosclerotic plaque or evidence of stenosis in the proximal internal carotid artery.  LEFT VERTEBRAL ARTERY:  Patent with normal antegrade flow.  IMPRESSION: 1. No significant atherosclerotic plaque or stenosis in either internal carotid artery. 2. Minimal heterogeneous atherosclerotic plaque in the right distal common carotid artery without stenosis. 3. Vertebral arteries are patent with antegrade flow.  Signed,  Sterling Big, MD, RPVI  [facility]  [facility]   Electronically Signed   By: Malachy Moan M.D.   On: 04/16/2020 12:45  Consults: neurosurgery  Discharge Instructions: as noted above + follow-up with neurology and PCP  Disease/illness Education: Reviewed with patient and family today  Home Health/Community Services Discussions/Referrals: PT home health order placed  Establishment or re-establishment of referral orders for community resources: none  Discussion with other health care providers: reviewed all recent notes  Assessment and Support of treatment regimen adherence: reviewed with patient  Appointments Coordinated with: reviewed with patient and family  Education for self-management, independent living, and ADLs: reviewed with patient and family  HYPERTENSION / HYPERLIPIDEMIA Followed by cardiology, Dr. Cassie Freer. Last seen 02/20/2020 with no medication changes made.  Cotninues on Lipitor, Losartan, Cardizem, Carvedilol, Lasix.  Recently ASA and Eliquis held in hospital due to hematoma.  HFrEF 35% on recent echo.   Satisfied with current treatment? yes Duration of hypertension: chronic BP monitoring frequency:  daily BP range: 120/80 on average at home BP medication side effects: no Duration of hyperlipidemia: chronic Cholesterol medication side effects: no Cholesterol supplements: fish oil Medication compliance: good compliance Aspirin: yes Recent stressors: no Recurrent headaches: no Visual changes: no Palpitations: no Dyspnea: no Chest pain: no Lower extremity edema: no Dizzy/lightheaded: no   ATRIAL FIBRILLATION Atrial fibrillation status: stable Satisfied with current treatment: yes  Medication side effects:  no Medication compliance: good compliance Etiology of atrial fibrillation:  Palpitations:  no Chest pain:  no Dyspnea on exertion:  no Orthopnea:  no Syncope:  no Edema:  no Ventricular rate control: diltiazem Anti-coagulation: long acting   IFG: A1C in hospital 6.5% with advanced age goal <8%.  No current medications and she remains diet controlled. Denies polyuria, polydipsia, and polyphagia. She continues to focus on diet reporting she no longer drinks [facility] and drinks water often. Does endorse enjoying sweets, especially fruit.  We discussed at length diabetes and continuing to check labs.  At this time she wishes to no longer check A1C.  Wishes to continue focus on diet and quality of life.  Relevant past medical, surgical, family and social history reviewed and updated as indicated. Interim medical history since our last visit reviewed. Allergies and medications reviewed and updated.  Review of Systems  Constitutional: Negative for activity change, appetite change, diaphoresis, fatigue and fever.  HENT: Negative.   Eyes: Negative.  Respiratory: Negative for cough, chest tightness, shortness of breath and wheezing.   Cardiovascular: Negative for chest pain, palpitations and leg swelling.  Gastrointestinal: Negative.   Endocrine: Negative.   Neurological: Positive for headaches (occasional, takes Tylenol and improves). Negative for dizziness, seizures,  syncope, facial asymmetry, speech difficulty, weakness, light-headedness and numbness.  Psychiatric/Behavioral: Negative.     Per HPI unless specifically indicated above     Objective:    BP 134/78 (BP Location: Left Arm)   Pulse 84   Temp 98.1 F (36.7 C) (Oral)   Wt 156 lb (70.8 kg)   LMP  (LMP Unknown)   SpO2 97%   BMI 26.78 kg/m   Wt Readings from Last 3 Encounters:  04/23/20 156 lb (70.8 kg)  04/17/20 156 lb 4.8 oz (70.9 kg)  04/04/20 152 lb (68.9 kg)    Physical Exam Vitals and nursing note reviewed.  Constitutional:      General: She is awake. She is not in acute distress.    Appearance: She is well-developed and well-groomed. She is not ill-appearing.  HENT:     Head: Normocephalic and atraumatic.     Right Ear: Hearing normal.     Left Ear: Hearing normal.  Eyes:     General: Lids are normal.        Right eye: No discharge.        Left eye: No discharge.     Extraocular Movements: Extraocular movements intact.     Conjunctiva/sclera: Conjunctivae normal.     Pupils: Pupils are equal, round, and reactive to light.     Visual Fields: Right eye visual fields normal and left eye visual fields normal.  Neck:     Thyroid: No thyromegaly.     Vascular: No carotid bruit.  Cardiovascular:     Rate and Rhythm: Normal rate. Rhythm irregularly irregular.     Heart sounds: Normal heart sounds. No murmur. No gallop.   Pulmonary:     Effort: Pulmonary effort is normal. No accessory muscle usage or respiratory distress.     Breath sounds: Normal breath sounds.  Abdominal:     General: Bowel sounds are normal.     Palpations: Abdomen is soft.  Musculoskeletal:     Cervical back: Normal range of motion and neck supple.     Right lower leg: Edema (trace) present.     Left lower leg: Edema (trace) present.  Lymphadenopathy:     Cervical: No cervical adenopathy.  Skin:    General: Skin is warm and dry.  Neurological:     Mental Status: She is alert.     Cranial  Nerves: Cranial nerves are intact.     Coordination: Coordination is intact.     Gait: Gait is intact.     Deep Tendon Reflexes:     Reflex Scores:      Brachioradialis reflexes are 1+ on the right side and 1+ on the left side.      Patellar reflexes are 1+ on the right side and 1+ on the left side.    Comments: Reports year as 2019, month June, day 6th, able to report who provider is.  Strength BUE 4/5 and BLE 3/5.  Steady gait with cane.  Psychiatric:        Attention and Perception: Attention normal.        Mood and Affect: Mood normal.        Speech: Speech normal.        Behavior: Behavior normal. Behavior  is cooperative.        Thought Content: Thought content normal.        Judgment: Judgment normal.     Results for orders placed or performed during the hospital encounter of 04/16/20  SARS Coronavirus 2 by RT PCR (hospital order, performed in [place] hospital lab) Nasopharyngeal Nasopharyngeal Swab   Specimen: Nasopharyngeal Swab  Result Value Ref Range   SARS Coronavirus 2 NEGATIVE NEGATIVE  Basic metabolic panel  Result Value Ref Range   Sodium 138 135 - 145 mmol/L   Potassium 3.6 3.5 - 5.1 mmol/L   Chloride 99 98 - 111 mmol/L   CO2 27 22 - 32 mmol/L   Glucose, Bld 137 (H) 70 - 99 mg/dL   BUN 22 8 - 23 mg/dL   Creatinine, Ser 0.89 0.44 - 1.00 mg/dL   Calcium 9.3 8.9 - 10.3 mg/dL   GFR calc non Af Amer 57 (L) >60 mL/min   GFR calc Af Amer >60 >60 mL/min   Anion gap 12 5 - 15  Protime-INR  Result Value Ref Range   Prothrombin Time 13.7 11.4 - 15.2 seconds   INR 1.1 0.8 - 1.2  APTT  Result Value Ref Range   aPTT 34 24 - 36 seconds  CBC with Differential/Platelet  Result Value Ref Range   WBC 9.8 4.0 - 10.5 K/uL   RBC 5.52 (H) 3.87 - 5.11 MIL/uL   Hemoglobin 14.2 12.0 - 15.0 g/dL   HCT 43.7 36.0 - 46.0 %   MCV 79.2 (L) 80.0 - 100.0 fL   MCH 25.7 (L) 26.0 - 34.0 pg   MCHC 32.5 30.0 - 36.0 g/dL   RDW 14.8 11.5 - 15.5 %   Platelets 338 150 - 400 K/uL   nRBC  0.0 0.0 - 0.2 %   Neutrophils Relative % 76 %   Neutro Abs 7.4 1.7 - 7.7 K/uL   Lymphocytes Relative 16 %   Lymphs Abs 1.6 0.7 - 4.0 K/uL   Monocytes Relative 7 %   Monocytes Absolute 0.7 0.1 - 1.0 K/uL   Eosinophils Relative 0 %   Eosinophils Absolute 0.0 0.0 - 0.5 K/uL   Basophils Relative 0 %   Basophils Absolute 0.0 0.0 - 0.1 K/uL   Immature Granulocytes 1 %   Abs Immature Granulocytes 0.05 0.00 - 0.07 K/uL  Brain natriuretic peptide  Result Value Ref Range   B Natriuretic Peptide 101.3 (H) 0.0 - 100.0 pg/mL  Hemoglobin A1c  Result Value Ref Range   Hgb A1c MFr Bld 6.5 (H) 4.8 - 5.6 %   Mean Plasma Glucose 139.85 mg/dL  Lipid panel  Result Value Ref Range   Cholesterol 155 0 - 200 mg/dL   Triglycerides 105 <150 mg/dL   HDL 39 (L) >40 mg/dL   Total CHOL/HDL Ratio 4.0 RATIO   VLDL 21 0 - 40 mg/dL   LDL Cholesterol 95 0 - 99 mg/dL  Troponin I (High Sensitivity)  Result Value Ref Range   Troponin I (High Sensitivity) 11 <18 ng/L      Assessment & Plan:   Problem List Items Addressed This Visit      Cardiovascular and Mediastinum   Essential hypertension    Chronic, stable with numbers on repeat at goal today for her age and at home.  Continue current medication regimen and collaboration with cardiology.  Monitor for hypotension and falls, reduce medication as needed.  BMP today.      Relevant Orders   CBC with  Differential/Platelet   Basic metabolic panel   Chronic atrial fibrillation (HCC)    Chronic, ongoing.  Continue current medication regimen and collaboration with cardiology.  Continue to hold Eliquis at this time until further assessment by neurology.  High fall risk and recent subdural hematoma.      Chronic systolic CHF (congestive heart failure) (HCC)    Chronic, ongoing.  Continue current medication regimen and collaboration with cardiology.  - Reminded to call for an overnight weight gain of >2 pounds or a weekly weight weight of >5 pounds - not adding  salt to his food and has been reading food labels. Reviewed the importance of keeping daily sodium intake to 2000mg  daily         Endocrine   IFG (impaired fasting glucose)    Recent A1C in hospital 6.5%, goal for advanced age is <8 and she is a high fall risk.  She wishes to continue to focus on healthy diet and quality of life.  This is appropriate due to advanced age.        Other   History of subdural hematoma 03/28/20 - Primary    Continue collaboration with neurology, has repeat scan end of month and follow-up with them in July.  Neuro exam stable today, with exception of some difficulty with memory.  Continue current medication regimen, to stop Keppra and Decadron on June 17th.  Will place home PT referral to help with strengthening and fall prevention.  She is aware she is not to drive.  Labs CBC and BMP today.  Return in 9 weeks or sooner if any worsening symptoms.      Relevant Orders   Ambulatory referral to Home Health   At high risk for falls    Home Health PT referral placed.        Relevant Orders   Ambulatory referral to Home Health      Time: 40 minutes, >50% spent counseling/or care coordination   Follow up plan: Return in about 9 weeks (around 06/25/2020) for HTN/HLD, Mood, Falls.

## 2020-04-24 LAB — CBC WITH DIFFERENTIAL/PLATELET
Basophils Absolute: 0 10*3/uL (ref 0.0–0.2)
Basos: 0 %
EOS (ABSOLUTE): 0 10*3/uL (ref 0.0–0.4)
Eos: 0 %
Hematocrit: 43.6 % (ref 34.0–46.6)
Hemoglobin: 13.7 g/dL (ref 11.1–15.9)
Immature Grans (Abs): 0 10*3/uL (ref 0.0–0.1)
Immature Granulocytes: 0 %
Lymphocytes Absolute: 1.2 10*3/uL (ref 0.7–3.1)
Lymphs: 15 %
MCH: 25.7 pg — ABNORMAL LOW (ref 26.6–33.0)
MCHC: 31.4 g/dL — ABNORMAL LOW (ref 31.5–35.7)
MCV: 82 fL (ref 79–97)
Monocytes Absolute: 0.5 10*3/uL (ref 0.1–0.9)
Monocytes: 6 %
Neutrophils Absolute: 6.4 10*3/uL (ref 1.4–7.0)
Neutrophils: 79 %
Platelets: 287 10*3/uL (ref 150–450)
RBC: 5.34 x10E6/uL — ABNORMAL HIGH (ref 3.77–5.28)
RDW: 14.2 % (ref 11.7–15.4)
WBC: 8.2 10*3/uL (ref 3.4–10.8)

## 2020-04-24 LAB — BASIC METABOLIC PANEL
BUN/Creatinine Ratio: 26 (ref 12–28)
BUN: 21 mg/dL (ref 8–27)
CO2: 26 mmol/L (ref 20–29)
Calcium: 9.5 mg/dL (ref 8.7–10.3)
Chloride: 102 mmol/L (ref 96–106)
Creatinine, Ser: 0.81 mg/dL (ref 0.57–1.00)
GFR calc Af Amer: 74 mL/min/{1.73_m2} (ref >59)
GFR calc non Af Amer: 65 mL/min/{1.73_m2} (ref >59)
Glucose: 139 mg/dL — ABNORMAL HIGH (ref 65–99)
Potassium: 4.5 mmol/L (ref 3.5–5.2)
Sodium: 143 mmol/L (ref 134–144)

## 2020-04-24 NOTE — Progress Notes (Signed)
Good morning please let Melinda Preston know her labs have returned and are remaining stable from her discharge.  We do not need to make any changes at this time.  Have a great day!!!  Stay safe.  The home health PT folks should be calling in upcoming days to schedule with her. Keep being awesome!! Kindest regards, Charleene Callegari

## 2020-04-28 ENCOUNTER — Other Ambulatory Visit: Payer: Self-pay | Admitting: Nurse Practitioner

## 2020-04-28 NOTE — Telephone Encounter (Signed)
Is the patient supposed to stay on this?

## 2020-04-28 NOTE — Telephone Encounter (Signed)
Requested medication (s) are due for refill today: No  Requested medication (s) are on the active medication list: yes  Last refill: Hospital encounter  Future visit scheduled: yes  Notes to clinic:  Med ordered hospital encounter. Post office note states to stop    Requested Prescriptions  Pending Prescriptions Disp Refills   dexamethasone (DECADRON) 0.5 MG tablet 28 tablet 0    Sig: Take 1 tablet (0.5 mg total) by mouth 2 (two) times daily for 14 days.      Not Delegated - Endocrinology:  Oral Corticosteroids Failed - 04/28/2020 10:30 AM      Failed - This refill cannot be delegated      Passed - Last BP in normal range    BP Readings from Last 1 Encounters:  04/23/20 134/78          Passed - Valid encounter within last 6 months    Recent Outpatient Visits           5 days ago History of subdural hematoma 03/28/20   [facility] [place], Corrie Dandy T, NP   3 weeks ago Acute non intractable tension-type headache   [facility] Mardene Celeste I, NP   2 months ago Dizziness   [facility] Gabriel Cirri, NP   7 months ago Chronic atrial fibrillation (HCC)   [facility] [name], Jolene T, NP   10 months ago Chronic systolic heart failure (HCC)   [facility] [place], Dorie Rank, NP       Future Appointments             In 1 month [name], Dorie Rank, NP [facility]

## 2020-04-28 NOTE — Telephone Encounter (Signed)
Medication Refill - Medication: Dexamethasone 0.5 mg for headaches  Has the patient contacted their pharmacy? No. (Agent: If no, request that the patient contact the pharmacy for the refill.) (Agent: If yes, when and what did the pharmacy advise?)  Preferred Pharmacy (with phone number or street name): [facility] in [address]  gent: Please be advised that RX refills may take up to 3 business days. We ask that you follow-up with your pharmacy.

## 2020-04-28 NOTE — Telephone Encounter (Signed)
No she was supposed to stop after dosing completed

## 2020-05-06 ENCOUNTER — Encounter: Payer: Self-pay | Admitting: Nurse Practitioner

## 2020-05-14 ENCOUNTER — Other Ambulatory Visit: Payer: Self-pay

## 2020-05-14 ENCOUNTER — Ambulatory Visit
Admission: RE | Admit: 2020-05-14 | Discharge: 2020-05-14 | Disposition: A | Discharge status: Home / Self Care | Payer: Medicare Other | Source: Ambulatory Visit | Attending: Neurosurgery | Admitting: Neurosurgery

## 2020-05-14 DIAGNOSIS — S065XAA Traumatic subdural hemorrhage with loss of consciousness status unknown, initial encounter: Secondary | ICD-10-CM

## 2020-05-14 DIAGNOSIS — G319 Degenerative disease of nervous system, unspecified: Secondary | ICD-10-CM | POA: Diagnosis not present

## 2020-05-14 DIAGNOSIS — S065X9A Traumatic subdural hemorrhage with loss of consciousness of unspecified duration, initial encounter: Secondary | ICD-10-CM | POA: Insufficient documentation

## 2020-05-14 DIAGNOSIS — I6200 Nontraumatic subdural hemorrhage, unspecified: Secondary | ICD-10-CM | POA: Diagnosis not present

## 2020-05-15 ENCOUNTER — Other Ambulatory Visit: Payer: Self-pay | Admitting: Nurse Practitioner

## 2020-05-15 DIAGNOSIS — Z8679 Personal history of other diseases of the circulatory system: Secondary | ICD-10-CM | POA: Diagnosis not present

## 2020-05-20 ENCOUNTER — Ambulatory Visit: Payer: Medicare Other | Admitting: Nurse Practitioner

## 2020-05-30 ENCOUNTER — Other Ambulatory Visit: Payer: Self-pay | Admitting: Nurse Practitioner

## 2020-06-19 DIAGNOSIS — I10 Essential (primary) hypertension: Secondary | ICD-10-CM | POA: Diagnosis not present

## 2020-06-19 DIAGNOSIS — I420 Dilated cardiomyopathy: Secondary | ICD-10-CM | POA: Diagnosis not present

## 2020-06-19 DIAGNOSIS — I340 Nonrheumatic mitral (valve) insufficiency: Secondary | ICD-10-CM | POA: Diagnosis not present

## 2020-06-19 DIAGNOSIS — I5022 Chronic systolic (congestive) heart failure: Secondary | ICD-10-CM | POA: Diagnosis not present

## 2020-06-19 DIAGNOSIS — I4819 Other persistent atrial fibrillation: Secondary | ICD-10-CM | POA: Diagnosis not present

## 2020-06-26 ENCOUNTER — Ambulatory Visit: Payer: Medicare Other | Admitting: Nurse Practitioner

## 2020-07-03 ENCOUNTER — Ambulatory Visit (INDEPENDENT_AMBULATORY_CARE_PROVIDER_SITE_OTHER): Payer: Medicare Other | Admitting: Nurse Practitioner

## 2020-07-03 ENCOUNTER — Other Ambulatory Visit: Payer: Self-pay

## 2020-07-03 ENCOUNTER — Encounter: Payer: Self-pay | Admitting: Nurse Practitioner

## 2020-07-03 VITALS — BP 129/88 | HR 80 | Temp 98.4°F | Wt 161.6 lb

## 2020-07-03 DIAGNOSIS — I10 Essential (primary) hypertension: Secondary | ICD-10-CM

## 2020-07-03 DIAGNOSIS — Z9181 History of falling: Secondary | ICD-10-CM

## 2020-07-03 DIAGNOSIS — I5022 Chronic systolic (congestive) heart failure: Secondary | ICD-10-CM

## 2020-07-03 DIAGNOSIS — Z8679 Personal history of other diseases of the circulatory system: Secondary | ICD-10-CM | POA: Diagnosis not present

## 2020-07-03 DIAGNOSIS — I482 Chronic atrial fibrillation, unspecified: Secondary | ICD-10-CM | POA: Diagnosis not present

## 2020-07-03 DIAGNOSIS — E7800 Pure hypercholesterolemia, unspecified: Secondary | ICD-10-CM | POA: Diagnosis not present

## 2020-07-03 NOTE — Assessment & Plan Note (Signed)
Chronic, ongoing.  Continue current medication regimen and adjust as needed.  Lipid panel next visit.    

## 2020-07-03 NOTE — Assessment & Plan Note (Signed)
Chronic, ongoing.  Continue current medication regimen and collaboration with cardiology.   - Reminded to call for an overnight weight gain of >2 pounds or a weekly weight weight of >5 pounds - not adding salt to his food and has been reading food labels. Reviewed the importance of keeping daily sodium intake to <2000mg daily  

## 2020-07-03 NOTE — Assessment & Plan Note (Signed)
No further falls, continue to monitor and consider PT if frequent falls present.

## 2020-07-03 NOTE — Progress Notes (Signed)
BP 129/88   Pulse 80   Temp 98.4 F (36.9 C) (Oral)   Wt 161 lb 9.6 oz (73.3 kg)   LMP  (LMP Unknown)   SpO2 97%   BMI 27.74 kg/m    Subjective:    Patient ID: Melinda Preston, female    DOB: 02-17-1928, 86 y.o.   MRN: [id number]  HPI: Melinda Preston is a 86 y.o. female  Chief Complaint  Patient presents with  . Hyperlipidemia  . Hypertension  . Fall   HYPERTENSION / HYPERLIPIDEMIA/HF Followed by cardiology, Dr. Cassie Freer. Last seen 06/19/20.  Continues on Lipitor, Losartan, Cardizem, Carvedilol, Lasix.  HFrEF 25-30% NYHA Class II-II, ACC/AHA stage C.  On review of note plan is to repeat echo next visit, in 4 months.  Has history of a subdural hematoma in early June 2021 after a fall and has been off of Eliquis since this time.  She was cleared by neurosurgery 05/16/20, with recommendations to remain off Eliquis indefinitely due to her frequent falls and risk of further falls with injury.  Cardiology has also recommended she remain off Eliquis and ASA at recent visit.  Has had no further falls since hospitalization in June. Satisfied with current treatment? yes Duration of hypertension: chronic BP monitoring frequency: occasionally BP range: 110-120/80's at home on average BP medication side effects: no Duration of hyperlipidemia: chronic Cholesterol medication side effects: no Cholesterol supplements: fish oil Medication compliance: good compliance Aspirin: yes Recent stressors: no Recurrent headaches: no Visual changes: no Palpitations: no Dyspnea: at baseline, no increase Chest pain: no Lower extremity edema: no Dizzy/lightheaded: no   ATRIAL FIBRILLATION Atrial fibrillation status: stable Satisfied with current treatment: yes  Medication side effects:  no Medication compliance: good compliance Etiology of atrial fibrillation:  Palpitations:  no Chest pain:  no Dyspnea on exertion:  no Orthopnea:  no Syncope:  no Edema:  no Ventricular rate  control: diltiazem Anti-coagulation: no longer taking due to falls  Relevant past medical, surgical, family and social history reviewed and updated as indicated. Interim medical history since our last visit reviewed. Allergies and medications reviewed and updated.  Review of Systems  Constitutional: Negative for activity change, appetite change, diaphoresis, fatigue and fever.  Respiratory: Negative for cough, chest tightness and shortness of breath.   Cardiovascular: Negative for chest pain, palpitations and leg swelling.  Gastrointestinal: Negative for abdominal distention, abdominal pain, constipation, diarrhea, nausea and vomiting.  Endocrine: Negative for cold intolerance, heat intolerance, polydipsia, polyphagia and polyuria.  Neurological: Negative for dizziness, syncope, weakness, light-headedness, numbness and headaches.  Psychiatric/Behavioral: Negative.     Per HPI unless specifically indicated above     Objective:    BP 129/88   Pulse 80   Temp 98.4 F (36.9 C) (Oral)   Wt 161 lb 9.6 oz (73.3 kg)   LMP  (LMP Unknown)   SpO2 97%   BMI 27.74 kg/m   Wt Readings from Last 3 Encounters:  07/03/20 161 lb 9.6 oz (73.3 kg)  04/23/20 156 lb (70.8 kg)  04/17/20 156 lb 4.8 oz (70.9 kg)    Physical Exam Vitals and nursing note reviewed.  Constitutional:      General: She is awake. She is not in acute distress.    Appearance: She is well-developed. She is not ill-appearing.  HENT:     Head: Normocephalic.     Right Ear: Hearing normal.     Left Ear: Hearing normal.     Nose: Nose normal.  Mouth/Throat:     Mouth: Mucous membranes are moist.  Eyes:     General: Lids are normal.        Right eye: No discharge.        Left eye: No discharge.     Conjunctiva/sclera: Conjunctivae normal.     Pupils: Pupils are equal, round, and reactive to light.  Neck:     Thyroid: No thyromegaly.     Vascular: No carotid bruit or JVD.  Cardiovascular:     Rate and Rhythm:  Normal rate. Rhythm irregularly irregular.     Heart sounds: Murmur heard.  Systolic murmur is present with a grade of 2/6.  No gallop.   Pulmonary:     Effort: Pulmonary effort is normal.     Breath sounds: Normal breath sounds.  Abdominal:     General: Bowel sounds are normal.     Palpations: Abdomen is soft. There is no hepatomegaly or splenomegaly.  Musculoskeletal:     Cervical back: Normal range of motion and neck supple.     Right lower leg: Edema (trace) present.     Left lower leg: Edema (trace) present.  Lymphadenopathy:     Cervical: No cervical adenopathy.  Skin:    General: Skin is warm and dry.  Neurological:     Mental Status: She is alert and oriented to person, place, and time.  Psychiatric:        Attention and Perception: Attention normal.        Mood and Affect: Mood normal.        Behavior: Behavior normal. Behavior is cooperative.        Thought Content: Thought content normal.        Judgment: Judgment normal.     Results for orders placed or performed in visit on 04/23/20  CBC with Differential/Platelet  Result Value Ref Range   WBC 8.2 3.4 - 10.8 x10E3/uL   RBC 5.34 (H) 3.77 - 5.28 x10E6/uL   Hemoglobin 13.7 11.1 - 15.9 g/dL   Hematocrit 67.6 19.5 - 46.6 %   MCV 82 79 - 97 fL   MCH 25.7 (L) 26.6 - 33.0 pg   MCHC 31.4 (L) 31 - 35 g/dL   RDW 09.3 26.7 - 12.4 %   Platelets 287 150 - 450 x10E3/uL   Neutrophils 79 Not Estab. %   Lymphs 15 Not Estab. %   Monocytes 6 Not Estab. %   Eos 0 Not Estab. %   Basos 0 Not Estab. %   Neutrophils Absolute 6.4 1 - 7 x10E3/uL   Lymphocytes Absolute 1.2 0 - 3 x10E3/uL   Monocytes Absolute 0.5 0 - 0 x10E3/uL   EOS (ABSOLUTE) 0.0 0.0 - 0.4 x10E3/uL   Basophils Absolute 0.0 0 - 0 x10E3/uL   Immature Granulocytes 0 Not Estab. %   Immature Grans (Abs) 0.0 0.0 - 0.1 x10E3/uL  Basic metabolic panel  Result Value Ref Range   Glucose 139 (H) 65 - 99 mg/dL   BUN 21 8 - 27 mg/dL   Creatinine, Ser 5.80 0.57 - 1.00  mg/dL   GFR calc non Af Amer 65 >59 mL/min/1.73   GFR calc Af Amer 74 >59 mL/min/1.73   BUN/Creatinine Ratio 26 12 - 28   Sodium 143 134 - 144 mmol/L   Potassium 4.5 3.5 - 5.2 mmol/L   Chloride 102 96 - 106 mmol/L   CO2 26 20 - 29 mmol/L   Calcium 9.5 8.7 - 10.3 mg/dL  Assessment & Plan:   Problem List Items Addressed This Visit      Cardiovascular and Mediastinum   Essential hypertension    Chronic, stable with numbers at goal today for her age and at home.  Continue current medication regimen and collaboration with cardiology.  Monitor for hypotension and falls, reduce medication as needed.  BMP next visit.  Focus on DASH diet at home.  Return in 6 months.      Chronic atrial fibrillation (HCC) - Primary    Chronic, ongoing.  Continue current medication regimen and collaboration with cardiology.  She is to remain off ASA and Eliquis on review of neurosurgery and cardiology notes, she is aware of this.  High fall risk and recent subdural hematoma.  Return in 6 months.      Chronic systolic CHF (congestive heart failure) (HCC)    Chronic, ongoing.  Continue current medication regimen and collaboration with cardiology.  - Reminded to call for an overnight weight gain of >2 pounds or a weekly weight weight of >5 pounds - not adding salt to his food and has been reading food labels. Reviewed the importance of keeping daily sodium intake to 2000mg  daily         Other   Hypercholesterolemia    Chronic, ongoing.  Continue current medication regimen and adjust as needed.  Lipid panel next visit.      History of subdural hematoma 03/28/20    No further falls since May.  She is to continue off Eliquis and ASA on review of cardiology and neurosurgery notes, she is aware of this.  Continue to closely monitor.      At high risk for falls    No further falls, continue to monitor and consider PT if frequent falls present.            Follow up plan: Return in about 6 months  (around 01/03/2021) for HTN/HLD, MOOD, A-Fib.

## 2020-07-03 NOTE — Assessment & Plan Note (Signed)
No further falls since May.  She is to continue off Eliquis and ASA on review of cardiology and neurosurgery notes, she is aware of this.  Continue to closely monitor. 

## 2020-07-03 NOTE — Patient Instructions (Signed)
DASH Eating Plan DASH stands for "Dietary Approaches to Stop Hypertension." The DASH eating plan is a healthy eating plan that has been shown to reduce high blood pressure (hypertension). It may also reduce your risk for type 2 diabetes, heart disease, and stroke. The DASH eating plan may also help with weight loss. What are tips for following this plan?  General guidelines  Avoid eating more than 2,300 mg (milligrams) of salt (sodium) a day. If you have hypertension, you may need to reduce your sodium intake to 1,500 mg a day.  Limit alcohol intake to no more than 1 drink a day for nonpregnant women and 2 drinks a day for men. One drink equals 12 oz of beer, 5 oz of wine, or 1 oz of hard liquor.  Work with your health care provider to maintain a healthy body weight or to lose weight. Ask what an ideal weight is for you.  Get at least 30 minutes of exercise that causes your heart to beat faster (aerobic exercise) most days of the week. Activities may include walking, swimming, or biking.  Work with your health care provider or diet and nutrition specialist (dietitian) to adjust your eating plan to your individual calorie needs. Reading food labels   Check food labels for the amount of sodium per serving. Choose foods with less than 5 percent of the Daily Value of sodium. Generally, foods with less than 300 mg of sodium per serving fit into this eating plan.  To find whole grains, look for the word "whole" as the first word in the ingredient list. Shopping  Buy products labeled as "low-sodium" or "no salt added."  Buy fresh foods. Avoid canned foods and premade or frozen meals. Cooking  Avoid adding salt when cooking. Use salt-free seasonings or herbs instead of table salt or sea salt. Check with your health care provider or pharmacist before using salt substitutes.  Do not fry foods. Cook foods using healthy methods such as baking, boiling, grilling, and broiling instead.  Cook with  heart-healthy oils, such as olive, canola, soybean, or sunflower oil. Meal planning  Eat a balanced diet that includes: ? 5 or more servings of fruits and vegetables each day. At each meal, try to fill half of your plate with fruits and vegetables. ? Up to 6-8 servings of whole grains each day. ? Less than 6 oz of lean meat, poultry, or fish each day. A 3-oz serving of meat is about the same size as a deck of cards. One egg equals 1 oz. ? 2 servings of low-fat dairy each day. ? A serving of nuts, seeds, or beans 5 times each week. ? Heart-healthy fats. Healthy fats called Omega-3 fatty acids are found in foods such as flaxseeds and coldwater fish, like sardines, salmon, and mackerel.  Limit how much you eat of the following: ? Canned or prepackaged foods. ? Food that is high in trans fat, such as fried foods. ? Food that is high in saturated fat, such as fatty meat. ? Sweets, desserts, sugary drinks, and other foods with added sugar. ? Full-fat dairy products.  Do not salt foods before eating.  Try to eat at least 2 vegetarian meals each week.  Eat more home-cooked food and less restaurant, buffet, and fast food.  When eating at a restaurant, ask that your food be prepared with less salt or no salt, if possible. What foods are recommended? The items listed may not be a complete list. Talk with your dietitian about   what dietary choices are best for you. Grains Whole-grain or whole-wheat bread. Whole-grain or whole-wheat pasta. Brown rice. Oatmeal. Quinoa. Bulgur. Whole-grain and low-sodium cereals. Pita bread. Low-fat, low-sodium crackers. Whole-wheat flour tortillas. Vegetables Fresh or frozen vegetables (raw, steamed, roasted, or grilled). Low-sodium or reduced-sodium tomato and vegetable juice. Low-sodium or reduced-sodium tomato sauce and tomato paste. Low-sodium or reduced-sodium canned vegetables. Fruits All fresh, dried, or frozen fruit. Canned fruit in natural juice (without  added sugar). Meat and other protein foods Skinless chicken or turkey. Ground chicken or turkey. Pork with fat trimmed off. Fish and seafood. Egg whites. Dried beans, peas, or lentils. Unsalted nuts, nut butters, and seeds. Unsalted canned beans. Lean cuts of beef with fat trimmed off. Low-sodium, lean deli meat. Dairy Low-fat (1%) or fat-free (skim) milk. Fat-free, low-fat, or reduced-fat cheeses. Nonfat, low-sodium ricotta or cottage cheese. Low-fat or nonfat yogurt. Low-fat, low-sodium cheese. Fats and oils Soft margarine without trans fats. Vegetable oil. Low-fat, reduced-fat, or light mayonnaise and salad dressings (reduced-sodium). Canola, safflower, olive, soybean, and sunflower oils. Avocado. Seasoning and other foods Herbs. Spices. Seasoning mixes without salt. Unsalted popcorn and pretzels. Fat-free sweets. What foods are not recommended? The items listed may not be a complete list. Talk with your dietitian about what dietary choices are best for you. Grains Baked goods made with fat, such as croissants, muffins, or some breads. Dry pasta or rice meal packs. Vegetables Creamed or fried vegetables. Vegetables in a cheese sauce. Regular canned vegetables (not low-sodium or reduced-sodium). Regular canned tomato sauce and paste (not low-sodium or reduced-sodium). Regular tomato and vegetable juice (not low-sodium or reduced-sodium). Pickles. Olives. Fruits Canned fruit in a light or heavy syrup. Fried fruit. Fruit in cream or butter sauce. Meat and other protein foods Fatty cuts of meat. Ribs. Fried meat. Bacon. Sausage. Bologna and other processed lunch meats. Salami. Fatback. Hotdogs. Bratwurst. Salted nuts and seeds. Canned beans with added salt. Canned or smoked fish. Whole eggs or egg yolks. Chicken or turkey with skin. Dairy Whole or 2% milk, cream, and half-and-half. Whole or full-fat cream cheese. Whole-fat or sweetened yogurt. Full-fat cheese. Nondairy creamers. Whipped toppings.  Processed cheese and cheese spreads. Fats and oils Butter. Stick margarine. Lard. Shortening. Ghee. Bacon fat. Tropical oils, such as coconut, palm kernel, or palm oil. Seasoning and other foods Salted popcorn and pretzels. Onion salt, garlic salt, seasoned salt, table salt, and sea salt. Worcestershire sauce. Tartar sauce. Barbecue sauce. Teriyaki sauce. Soy sauce, including reduced-sodium. Steak sauce. Canned and packaged gravies. Fish sauce. Oyster sauce. Cocktail sauce. Horseradish that you find on the shelf. Ketchup. Mustard. Meat flavorings and tenderizers. Bouillon cubes. Hot sauce and Tabasco sauce. Premade or packaged marinades. Premade or packaged taco seasonings. Relishes. Regular salad dressings. Where to find more information:  National Heart, Lung, and Blood Institute: www.nhlbi.nih.gov  American Heart Association: www.heart.org Summary  The DASH eating plan is a healthy eating plan that has been shown to reduce high blood pressure (hypertension). It may also reduce your risk for type 2 diabetes, heart disease, and stroke.  With the DASH eating plan, you should limit salt (sodium) intake to 2,300 mg a day. If you have hypertension, you may need to reduce your sodium intake to 1,500 mg a day.  When on the DASH eating plan, aim to eat more fresh fruits and vegetables, whole grains, lean proteins, low-fat dairy, and heart-healthy fats.  Work with your health care provider or diet and nutrition specialist (dietitian) to adjust your eating plan to your   individual calorie needs. This information is not intended to replace advice given to you by your health care provider. Make sure you discuss any questions you have with your health care provider. Document Revised: 10/14/2017 Document Reviewed: 10/25/2016 Elsevier Patient Education  2020 [employer]  

## 2020-07-03 NOTE — Assessment & Plan Note (Signed)
Chronic, stable with numbers at goal today for her age and at home.  Continue current medication regimen and collaboration with cardiology.  Monitor for hypotension and falls, reduce medication as needed.  BMP next visit.  Focus on DASH diet at home.  Return in 6 months.

## 2020-07-03 NOTE — Assessment & Plan Note (Signed)
Chronic, ongoing.  Continue current medication regimen and collaboration with cardiology.  She is to remain off ASA and Eliquis on review of neurosurgery and cardiology notes, she is aware of this.  High fall risk and recent subdural hematoma.  Return in 6 months.

## 2020-07-15 DIAGNOSIS — E113293 Type 2 diabetes mellitus with mild nonproliferative diabetic retinopathy without macular edema, bilateral: Secondary | ICD-10-CM | POA: Diagnosis not present

## 2020-07-15 LAB — HM DIABETES EYE EXAM

## 2020-08-21 ENCOUNTER — Other Ambulatory Visit: Payer: Self-pay | Admitting: Nurse Practitioner

## 2020-09-02 ENCOUNTER — Ambulatory Visit (INDEPENDENT_AMBULATORY_CARE_PROVIDER_SITE_OTHER): Payer: Medicare Other

## 2020-09-02 ENCOUNTER — Other Ambulatory Visit: Payer: Self-pay

## 2020-09-02 DIAGNOSIS — Z23 Encounter for immunization: Secondary | ICD-10-CM | POA: Diagnosis not present

## 2020-09-16 ENCOUNTER — Other Ambulatory Visit: Payer: Self-pay | Admitting: Nurse Practitioner

## 2020-09-19 ENCOUNTER — Other Ambulatory Visit: Payer: Self-pay | Admitting: Nurse Practitioner

## 2020-09-19 DIAGNOSIS — M25473 Effusion, unspecified ankle: Secondary | ICD-10-CM

## 2020-10-20 DIAGNOSIS — I5022 Chronic systolic (congestive) heart failure: Secondary | ICD-10-CM | POA: Diagnosis not present

## 2020-10-20 DIAGNOSIS — I10 Essential (primary) hypertension: Secondary | ICD-10-CM | POA: Diagnosis not present

## 2020-10-20 DIAGNOSIS — I4819 Other persistent atrial fibrillation: Secondary | ICD-10-CM | POA: Diagnosis not present

## 2020-10-20 DIAGNOSIS — I340 Nonrheumatic mitral (valve) insufficiency: Secondary | ICD-10-CM | POA: Diagnosis not present

## 2020-10-20 DIAGNOSIS — R0602 Shortness of breath: Secondary | ICD-10-CM | POA: Diagnosis not present

## 2020-11-11 ENCOUNTER — Other Ambulatory Visit: Payer: Self-pay | Admitting: Nurse Practitioner

## 2020-12-02 ENCOUNTER — Other Ambulatory Visit: Payer: Self-pay | Admitting: Nurse Practitioner

## 2021-01-03 ENCOUNTER — Encounter: Payer: Self-pay | Admitting: Nurse Practitioner

## 2021-01-03 DIAGNOSIS — I700 Atherosclerosis of aorta: Secondary | ICD-10-CM | POA: Insufficient documentation

## 2021-01-06 ENCOUNTER — Encounter: Payer: Self-pay | Admitting: Nurse Practitioner

## 2021-01-06 ENCOUNTER — Ambulatory Visit (INDEPENDENT_AMBULATORY_CARE_PROVIDER_SITE_OTHER): Payer: Medicare Other | Admitting: Nurse Practitioner

## 2021-01-06 ENCOUNTER — Other Ambulatory Visit: Payer: Self-pay

## 2021-01-06 VITALS — BP 129/83 | HR 79 | Temp 98.2°F | Wt 161.0 lb

## 2021-01-06 DIAGNOSIS — F325 Major depressive disorder, single episode, in full remission: Secondary | ICD-10-CM

## 2021-01-06 DIAGNOSIS — I10 Essential (primary) hypertension: Secondary | ICD-10-CM | POA: Diagnosis not present

## 2021-01-06 DIAGNOSIS — I700 Atherosclerosis of aorta: Secondary | ICD-10-CM

## 2021-01-06 DIAGNOSIS — E7800 Pure hypercholesterolemia, unspecified: Secondary | ICD-10-CM | POA: Diagnosis not present

## 2021-01-06 DIAGNOSIS — R7301 Impaired fasting glucose: Secondary | ICD-10-CM

## 2021-01-06 DIAGNOSIS — Z8679 Personal history of other diseases of the circulatory system: Secondary | ICD-10-CM | POA: Diagnosis not present

## 2021-01-06 DIAGNOSIS — F5101 Primary insomnia: Secondary | ICD-10-CM

## 2021-01-06 DIAGNOSIS — I5022 Chronic systolic (congestive) heart failure: Secondary | ICD-10-CM | POA: Diagnosis not present

## 2021-01-06 DIAGNOSIS — I482 Chronic atrial fibrillation, unspecified: Secondary | ICD-10-CM

## 2021-01-06 MED ORDER — DILTIAZEM HCL ER COATED BEADS 180 MG PO CP24: ORAL_CAPSULE | ORAL | 4 refills | Status: DC

## 2021-01-06 MED ORDER — ATORVASTATIN CALCIUM 20 MG PO TABS: ORAL_TABLET | ORAL | 4 refills | Status: DC

## 2021-01-06 MED ORDER — TRAZODONE HCL 50 MG PO TABS: ORAL_TABLET | ORAL | 4 refills | Status: DC

## 2021-01-06 MED ORDER — POTASSIUM CHLORIDE CRYS ER 20 MEQ PO TBCR: EXTENDED_RELEASE_TABLET | ORAL | 4 refills | Status: DC

## 2021-01-06 MED ORDER — SERTRALINE HCL 25 MG PO TABS: ORAL_TABLET | ORAL | 4 refills | Status: DC

## 2021-01-06 MED ORDER — LOSARTAN POTASSIUM 25 MG PO TABS: ORAL_TABLET | ORAL | 4 refills | Status: DC

## 2021-01-06 NOTE — Patient Instructions (Signed)

## 2021-01-06 NOTE — Assessment & Plan Note (Signed)
Noted on CXR 10/01/2016.  She is to remain off ASA, continue statin daily and adjust as needed.

## 2021-01-06 NOTE — Assessment & Plan Note (Signed)
Chronic, stable with Trazodone.  Continue current regimen and adjust as needed.  Refills sent in. 

## 2021-01-06 NOTE — Assessment & Plan Note (Signed)
Chronic, ongoing.  Continue current medication regimen and collaboration with cardiology.  She is to remain off ASA and Eliquis indefinitely on review of neurosurgery and cardiology notes, she is aware of this.  High fall risk and recent subdural hematoma.  CBC today.  Return in 6 months.

## 2021-01-06 NOTE — Assessment & Plan Note (Signed)
Ongoing.  6.5% June 2021 -- goal with age <8%, avoid medication unless needed.  Goal for age is A1c <8% per geriatric society, higher risk for falls due to past falls.

## 2021-01-06 NOTE — Progress Notes (Signed)
BP 129/83 (BP Location: Left Arm, Cuff Size: Normal)   Pulse 79   Temp 98.2 F (36.8 C) (Oral)   Wt 161 lb (73 kg)   LMP  (LMP Unknown)   SpO2 92%   BMI 27.64 kg/m    Subjective:    Patient ID: Melinda Preston, female    DOB: [birth year], 87 y.o.   MRN: [id number]  HPI: Melinda Preston is a 87 y.o. female  Chief Complaint  Patient presents with  . Follow-up   HYPERTENSION / HYPERLIPIDEMIA/HF Followed by cardiology, Dr. Cassie FreerParachos. Last seen 10/20/20. Continues on Lipitor, Losartan, Cardizem, Carvedilol, Lasix. HFrEF 35% NYHA Class II-II, ACC/AHA stage C on 03/17/20.  Has history of a subdural hematoma in early June 2021 after a fall and has been off of Eliquis since this time.  She was cleared by neurosurgery 05/16/20, with recommendations to remain off Eliquis indefinitely due to her frequent falls and risk of further falls with injury.  Cardiology has also recommended she remain off Eliquis and ASA.  She does endorse some mild memory changes, like forgetting what she was talking about, since fall but nor worsening of this.  Has had no further falls since hospitalization in June 2021. Satisfied with current treatment? yes Duration of hypertension: chronic BP monitoring frequency: occasionally BP range: 110-120/80's at home on average BP medication side effects: no Duration of hyperlipidemia: chronic Cholesterol medication side effects: no Cholesterol supplements: fish oil Medication compliance: good compliance Aspirin: yes Recent stressors: no Recurrent headaches: no Visual changes: no Palpitations: no Dyspnea: at baseline, no increase Chest pain: no Lower extremity edema: no Dizzy/lightheaded: no   ATRIAL FIBRILLATION Atrial fibrillation status: stable Satisfied with current treatment: yes  Medication side effects:  no Medication compliance: good compliance Etiology of atrial fibrillation:  Palpitations:  no Chest pain:  no Dyspnea on exertion:   no Orthopnea:  no Syncope:  no Edema:  no Ventricular rate control: diltiazem Anti-coagulation: no longer taking due to falls  DEPRESSION Continues on Sertraline 25 MG daily and Trazodone 50 MG QHS. Mood status: stable Satisfied with current treatment?: yes Symptom severity: mild  Duration of current treatment : chronic Side effects: no Medication compliance: good compliance Depressed mood: no Anxious mood: no Anhedonia: no Significant weight loss or gain: no Insomnia: yes hard to fall asleep Fatigue: no Feelings of worthlessness or guilt: no Impaired concentration/indecisiveness: no Suicidal ideations: no Hopelessness: no Crying spells: no [facility] screen [facility] 01/06/2021 04/23/2020 02/18/2020 09/11/2019 06/05/2019  Decreased Interest 0 0 0 0 1  Down, Depressed, Hopeless 0 0 0 0 0  PHQ - 2 Score 0 0 0 0 1  Altered sleeping 0 0 0 0 0  Tired, decreased energy 1 0 2 1 2   Change in appetite 0 0 0 0 0  Feeling bad or failure about yourself  0 0 0 0 0  Trouble concentrating 0 1 0 0 0  Moving slowly or fidgety/restless 0 1 0 0 0  Suicidal thoughts 0 0 0 0 0  PHQ-9 Score 1 2 2 1 3   Difficult doing work/chores Not difficult at all - Not difficult at all Not difficult at all Not difficult at all     Relevant past medical, surgical, family and social history reviewed and updated as indicated. Interim medical history since our last visit reviewed. Allergies and medications reviewed and updated.  Review of Systems  Constitutional: Negative for activity change, appetite change, diaphoresis, fatigue and fever.  Respiratory: Negative for cough,  chest tightness and shortness of breath.   Cardiovascular: Negative for chest pain, palpitations and leg swelling.  Gastrointestinal: Negative.   Endocrine: Negative for cold intolerance, heat intolerance, polydipsia, polyphagia and polyuria.  Neurological: Negative.   Psychiatric/Behavioral: Negative.     Per HPI unless specifically  indicated above     Objective:    BP 129/83 (BP Location: Left Arm, Cuff Size: Normal)   Pulse 79   Temp 98.2 F (36.8 C) (Oral)   Wt 161 lb (73 kg)   LMP  (LMP Unknown)   SpO2 92%   BMI 27.64 kg/m   Wt Readings from Last 3 Encounters:  01/06/21 161 lb (73 kg)  07/03/20 161 lb 9.6 oz (73.3 kg)  04/23/20 156 lb (70.8 kg)    Physical Exam Vitals and nursing note reviewed.  Constitutional:      General: She is awake. She is not in acute distress.    Appearance: She is well-developed. She is not ill-appearing.  HENT:     Head: Normocephalic.     Right Ear: Hearing normal.     Left Ear: Hearing normal.     Nose: Nose normal.     Mouth/Throat:     Mouth: Mucous membranes are moist.  Eyes:     General: Lids are normal.        Right eye: No discharge.        Left eye: No discharge.     Conjunctiva/sclera: Conjunctivae normal.     Pupils: Pupils are equal, round, and reactive to light.  Neck:     Thyroid: No thyromegaly.     Vascular: No carotid bruit or JVD.  Cardiovascular:     Rate and Rhythm: Normal rate. Rhythm irregularly irregular.     Heart sounds: Murmur heard.   Systolic murmur is present with a grade of 2/6. No gallop.      Comments: Systolic murmur with radiation to carotids. Pulmonary:     Effort: Pulmonary effort is normal.     Breath sounds: Normal breath sounds.  Abdominal:     General: Bowel sounds are normal.     Palpations: Abdomen is soft.  Musculoskeletal:     Cervical back: Normal range of motion and neck supple.     Right lower leg: Edema (trace) present.     Left lower leg: Edema (trace) present.  Lymphadenopathy:     Cervical: No cervical adenopathy.  Skin:    General: Skin is warm and dry.  Neurological:     Mental Status: She is alert and oriented to person, place, and time.  Psychiatric:        Attention and Perception: Attention normal.        Mood and Affect: Mood normal.        Behavior: Behavior normal. Behavior is cooperative.         Thought Content: Thought content normal.        Judgment: Judgment normal.    Results for orders placed or performed in visit on 07/16/20  HM DIABETES EYE EXAM  Result Value Ref Range   HM Diabetic Eye Exam No Retinopathy No Retinopathy      Assessment & Plan:   Problem List Items Addressed This Visit      Cardiovascular and Mediastinum   Essential hypertension    Chronic, stable with numbers at goal today for her age and at home.  Continue current medication regimen and collaboration with cardiology.  Monitor for hypotension and falls, reduce medication as  needed.  CMP, CBC, and TSH today.  Recommend she monitor BP at least 3 days a week at home and document for provider visits.  Focus on DASH diet at home.  Return in 6 months.      Relevant Medications   losartan (COZAAR) 25 MG tablet   atorvastatin (LIPITOR) 20 MG tablet   diltiazem (CARDIZEM CD) 180 MG 24 hr capsule   Other Relevant Orders   Comprehensive metabolic panel   TSH   Chronic atrial fibrillation (HCC)    Chronic, ongoing.  Continue current medication regimen and collaboration with cardiology.  She is to remain off ASA and Eliquis indefinitely on review of neurosurgery and cardiology notes, she is aware of this.  High fall risk and recent subdural hematoma.  CBC today.  Return in 6 months.      Relevant Medications   losartan (COZAAR) 25 MG tablet   atorvastatin (LIPITOR) 20 MG tablet   diltiazem (CARDIZEM CD) 180 MG 24 hr capsule   Other Relevant Orders   Comprehensive metabolic panel   CBC with Differential/Platelet   Chronic systolic CHF (congestive heart failure) (HCC) - Primary    Chronic, ongoing.  Continue current medication regimen and collaboration with cardiology.  - Reminded to call for an overnight weight gain of >2 pounds or a weekly weight weight of >5 pounds - not adding salt to his food and has been reading food labels. Reviewed the importance of keeping daily sodium intake to 2000mg  daily        Relevant Medications   losartan (COZAAR) 25 MG tablet   potassium chloride SA (KLOR-CON) 20 MEQ tablet   atorvastatin (LIPITOR) 20 MG tablet   diltiazem (CARDIZEM CD) 180 MG 24 hr capsule   Aortic atherosclerosis (HCC)    Noted on CXR 10/01/2016.  She is to remain off ASA, continue statin daily and adjust as needed.      Relevant Medications   losartan (COZAAR) 25 MG tablet   atorvastatin (LIPITOR) 20 MG tablet   diltiazem (CARDIZEM CD) 180 MG 24 hr capsule     Endocrine   IFG (impaired fasting glucose)    Ongoing.  6.5% June 2021 -- goal with age <8%, avoid medication unless needed.  Goal for age is A1c <8% per geriatric society, higher risk for falls due to past falls.      Relevant Orders   HgB A1c     Other   Hypercholesterolemia    Chronic, ongoing.  Continue current medication regimen and adjust as needed.  Lipid panel today.      Relevant Medications   losartan (COZAAR) 25 MG tablet   atorvastatin (LIPITOR) 20 MG tablet   diltiazem (CARDIZEM CD) 180 MG 24 hr capsule   Other Relevant Orders   Lipid Panel w/o Chol/HDL Ratio   Depression    Chronic, stable.  Denies SI/HI.  Continue current medication regimen and adjust as needed.  Monitor NA+ level with Sertraline and advanced age.  Return in 6 months.  Refills sent in.      Relevant Medications   sertraline (ZOLOFT) 25 MG tablet   traZODone (DESYREL) 50 MG tablet   Insomnia    Chronic, stable with Trazodone.  Continue current regimen and adjust as needed.  Refills sent in.      History of subdural hematoma 03/28/20    No further falls since May.  She is to continue off Eliquis and ASA on review of cardiology and neurosurgery notes, she is aware of this.  Continue to closely monitor.          Follow up plan: Return in about 6 months (around 07/06/2021) for HTN/HLD, MOOD, IFG, A-FIB, GERD.

## 2021-01-06 NOTE — Assessment & Plan Note (Signed)
No further falls since May.  She is to continue off Eliquis and ASA on review of cardiology and neurosurgery notes, she is aware of this.  Continue to closely monitor.

## 2021-01-06 NOTE — Assessment & Plan Note (Signed)
Chronic, stable with numbers at goal today for her age and at home.  Continue current medication regimen and collaboration with cardiology.  Monitor for hypotension and falls, reduce medication as needed.  CMP, CBC, and TSH today.  Recommend she monitor BP at least 3 days a week at home and document for provider visits.  Focus on DASH diet at home.  Return in 6 months.

## 2021-01-06 NOTE — Assessment & Plan Note (Addendum)
Chronic, stable.  Denies SI/HI.  Continue current medication regimen and adjust as needed.  Monitor NA+ level with Sertraline and advanced age.  Return in 6 months.  Refills sent in.

## 2021-01-06 NOTE — Assessment & Plan Note (Signed)
Chronic, ongoing.  Continue current medication regimen and collaboration with cardiology.   - Reminded to call for an overnight weight gain of >2 pounds or a weekly weight weight of >5 pounds - not adding salt to his food and has been reading food labels. Reviewed the importance of keeping daily sodium intake to <2000mg daily  

## 2021-01-06 NOTE — Assessment & Plan Note (Signed)
Chronic, ongoing.  Continue current medication regimen and adjust as needed. Lipid panel today. 

## 2021-01-07 LAB — COMPREHENSIVE METABOLIC PANEL
ALT: 9 IU/L (ref 0–32)
AST: 19 IU/L (ref 0–40)
Albumin/Globulin Ratio: 1.9 (ref 1.2–2.2)
Albumin: 4.5 g/dL (ref 3.5–4.6)
Alkaline Phosphatase: 78 IU/L (ref 44–121)
BUN/Creatinine Ratio: 18 (ref 12–28)
BUN: 14 mg/dL (ref 10–36)
Bilirubin Total: 0.6 mg/dL (ref 0.0–1.2)
CO2: 29 mmol/L (ref 20–29)
Calcium: 9.5 mg/dL (ref 8.7–10.3)
Chloride: 98 mmol/L (ref 96–106)
Creatinine, Ser: 0.79 mg/dL (ref 0.57–1.00)
GFR calc Af Amer: 76 mL/min/{1.73_m2} (ref >59)
GFR calc non Af Amer: 66 mL/min/{1.73_m2} (ref >59)
Globulin, Total: 2.4 g/dL (ref 1.5–4.5)
Glucose: 115 mg/dL — ABNORMAL HIGH (ref 65–99)
Potassium: 4.1 mmol/L (ref 3.5–5.2)
Sodium: 142 mmol/L (ref 134–144)
Total Protein: 6.9 g/dL (ref 6.0–8.5)

## 2021-01-07 LAB — LIPID PANEL W/O CHOL/HDL RATIO
Cholesterol, Total: 164 mg/dL (ref 100–199)
HDL: 58 mg/dL (ref >39)
LDL Chol Calc (NIH): 88 mg/dL (ref 0–99)
Triglycerides: 97 mg/dL (ref 0–149)
VLDL Cholesterol Cal: 18 mg/dL (ref 5–40)

## 2021-01-07 LAB — CBC WITH DIFFERENTIAL/PLATELET
Basophils Absolute: 0 10*3/uL (ref 0.0–0.2)
Basos: 1 %
EOS (ABSOLUTE): 0 10*3/uL (ref 0.0–0.4)
Eos: 1 %
Hematocrit: 44 % (ref 34.0–46.6)
Hemoglobin: 14 g/dL (ref 11.1–15.9)
Immature Grans (Abs): 0 10*3/uL (ref 0.0–0.1)
Immature Granulocytes: 0 %
Lymphocytes Absolute: 1.7 10*3/uL (ref 0.7–3.1)
Lymphs: 26 %
MCH: 24.5 pg — ABNORMAL LOW (ref 26.6–33.0)
MCHC: 31.8 g/dL (ref 31.5–35.7)
MCV: 77 fL — ABNORMAL LOW (ref 79–97)
Monocytes Absolute: 0.7 10*3/uL (ref 0.1–0.9)
Monocytes: 10 %
Neutrophils Absolute: 4.1 10*3/uL (ref 1.4–7.0)
Neutrophils: 62 %
Platelets: 190 10*3/uL (ref 150–450)
RBC: 5.71 x10E6/uL — ABNORMAL HIGH (ref 3.77–5.28)
RDW: 15.1 % (ref 11.7–15.4)
WBC: 6.5 10*3/uL (ref 3.4–10.8)

## 2021-01-07 LAB — HEMOGLOBIN A1C
Est. average glucose Bld gHb Est-mCnc: 140 mg/dL
Hgb A1c MFr Bld: 6.5 % — ABNORMAL HIGH (ref 4.8–5.6)

## 2021-01-07 LAB — TSH: TSH: 1.76 u[IU]/mL (ref 0.450–4.500)

## 2021-01-07 NOTE — Progress Notes (Signed)
Please let Denaisha know her labs have returned and they are overall remaining in stable range for her age.  Kidney function is normal.  We do not need to change any medications at this time.  Continue all current medications.  If any questions let me know.  Have a great day!! Keep being awesome!!  Thank you for allowing me to participate in your care. Kindest regards, Hattie Pine

## 2021-02-09 ENCOUNTER — Other Ambulatory Visit: Payer: Self-pay | Admitting: Nurse Practitioner

## 2021-03-03 DIAGNOSIS — I340 Nonrheumatic mitral (valve) insufficiency: Secondary | ICD-10-CM | POA: Diagnosis not present

## 2021-03-03 DIAGNOSIS — I5022 Chronic systolic (congestive) heart failure: Secondary | ICD-10-CM | POA: Diagnosis not present

## 2021-03-03 DIAGNOSIS — S065X9A Traumatic subdural hemorrhage with loss of consciousness of unspecified duration, initial encounter: Secondary | ICD-10-CM | POA: Diagnosis not present

## 2021-03-03 DIAGNOSIS — I420 Dilated cardiomyopathy: Secondary | ICD-10-CM | POA: Diagnosis not present

## 2021-03-03 DIAGNOSIS — I10 Essential (primary) hypertension: Secondary | ICD-10-CM | POA: Diagnosis not present

## 2021-03-03 DIAGNOSIS — I4819 Other persistent atrial fibrillation: Secondary | ICD-10-CM | POA: Diagnosis not present

## 2021-05-22 ENCOUNTER — Ambulatory Visit: Payer: Medicare Other

## 2021-07-03 ENCOUNTER — Ambulatory Visit: Payer: Medicare Other

## 2021-07-08 ENCOUNTER — Other Ambulatory Visit: Payer: Self-pay

## 2021-07-08 ENCOUNTER — Encounter: Payer: Self-pay | Admitting: Nurse Practitioner

## 2021-07-08 ENCOUNTER — Ambulatory Visit (INDEPENDENT_AMBULATORY_CARE_PROVIDER_SITE_OTHER): Payer: Medicare Other | Admitting: Nurse Practitioner

## 2021-07-08 VITALS — BP 123/83 | HR 74 | Temp 98.9°F | Wt 160.6 lb

## 2021-07-08 DIAGNOSIS — I700 Atherosclerosis of aorta: Secondary | ICD-10-CM

## 2021-07-08 DIAGNOSIS — I10 Essential (primary) hypertension: Secondary | ICD-10-CM

## 2021-07-08 DIAGNOSIS — F5101 Primary insomnia: Secondary | ICD-10-CM

## 2021-07-08 DIAGNOSIS — I482 Chronic atrial fibrillation, unspecified: Secondary | ICD-10-CM

## 2021-07-08 DIAGNOSIS — Z8679 Personal history of other diseases of the circulatory system: Secondary | ICD-10-CM | POA: Diagnosis not present

## 2021-07-08 DIAGNOSIS — R7301 Impaired fasting glucose: Secondary | ICD-10-CM

## 2021-07-08 DIAGNOSIS — K219 Gastro-esophageal reflux disease without esophagitis: Secondary | ICD-10-CM | POA: Diagnosis not present

## 2021-07-08 DIAGNOSIS — F325 Major depressive disorder, single episode, in full remission: Secondary | ICD-10-CM | POA: Diagnosis not present

## 2021-07-08 DIAGNOSIS — E7800 Pure hypercholesterolemia, unspecified: Secondary | ICD-10-CM | POA: Diagnosis not present

## 2021-07-08 DIAGNOSIS — I5022 Chronic systolic (congestive) heart failure: Secondary | ICD-10-CM | POA: Diagnosis not present

## 2021-07-08 MED ORDER — OMEPRAZOLE 20 MG PO CPDR: DELAYED_RELEASE_CAPSULE | ORAL | 4 refills | Status: DC

## 2021-07-08 NOTE — Assessment & Plan Note (Signed)
Chronic, ongoing.  Euvolemic today.  Continue current medication regimen and collaboration with cardiology.  - Reminded to call for an overnight weight gain of >2 pounds or a weekly weight weight of >5 pounds - not adding salt to his food and has been reading food labels. Reviewed the importance of keeping daily sodium intake to 2000mg  daily

## 2021-07-08 NOTE — Assessment & Plan Note (Signed)
Chronic, stable with Trazodone.  Continue current regimen and adjust as needed.  Refills up to date.

## 2021-07-08 NOTE — Assessment & Plan Note (Signed)
Chronic, stable with numbers at goal today for her age and at home.  Continue current medication regimen and collaboration with cardiology.  Monitor for hypotension and falls, reduce medication as needed.  CMP today.  Recommend she monitor BP at least 3 days a week at home and document for provider visits.  Focus on DASH diet at home.  Return in 6 months.

## 2021-07-08 NOTE — Assessment & Plan Note (Signed)
Noted on CXR 10/01/2016.  She is to remain off ASA, continue statin daily and adjust as needed. 

## 2021-07-08 NOTE — Assessment & Plan Note (Signed)
Chronic, stable.  Denies SI/HI.  Continue current medication regimen and adjust as needed.  Monitor NA+ level with Sertraline and advanced age.  Return in 6 months.   

## 2021-07-08 NOTE — Assessment & Plan Note (Addendum)
Chronic, ongoing.  Continue current medication regimen and collaboration with cardiology. She is to remain off ASA and Eliquis indefinitely on review of neurosurgery and cardiology notes, she is aware of this.  High fall risk and history of subdural hematoma.  Return in 6 months.

## 2021-07-08 NOTE — Assessment & Plan Note (Addendum)
Ongoing.  6.5% last visit.  Goal for age is A1c <8% per geriatric society, higher risk for falls due to past falls.  Avoid medication at this time and monitor levels.

## 2021-07-08 NOTE — Progress Notes (Signed)
BP 123/83   Pulse 74   Temp 98.9 F (37.2 C) (Oral)   Wt 160 lb 9.6 oz (72.8 kg)   LMP  (LMP Unknown)   SpO2 95%   BMI 27.57 kg/m    Subjective:    Patient ID: Melinda Preston, female    DOB: 1928/02/17, 87 y.o.   MRN: [id number]  HPI: Melinda Preston is a 87 y.o. female  Chief Complaint  Patient presents with   Hyperlipidemia   Hypertension   Atrial Fibrillation   Gastroesophageal Reflux   IFG   HYPERTENSION / HYPERLIPIDEMIA/HF Followed by cardiology, Dr. Cassie Freer.  Last seen 03/03/21. Continues on Lipitor, Losartan, Cardizem, Carvedilol, Lasix. HFrEF 35% NYHA Class II-II, ACC/AHA stage C with mild aortic stenosis on 03/17/20.  A1c on recent checks 6.3 to 6.5%.  She is interested in some oxygen to wear as needed only in the future when she is active.    History of a subdural hematoma in early June 2021 after a fall and has been off of Eliquis since this time. Was cleared by neurosurgery 05/16/20, with recommendations to remain off Eliquis indefinitely due to her frequent falls and risk of further falls with injury.  Cardiology has also recommended she remain off Eliquis and ASA.    She does endorse some mild memory changes, like forgetting what she was talking about, since fall but has noticed no worsening of this.  Has had no further falls since hospitalization in June 2021.   Satisfied with current treatment? yes Duration of hypertension: chronic BP monitoring frequency: occasionally BP range: 110-120/80's at home on average BP medication side effects: no Duration of hyperlipidemia: chronic Cholesterol medication side effects: no Cholesterol supplements: fish oil Medication compliance: good compliance Aspirin: yes Recent stressors: no Recurrent headaches: no Visual changes: no Palpitations: no Dyspnea: notices it more with house cleaning Chest pain: no Lower extremity edema: occasionally Dizzy/lightheaded: no    ATRIAL FIBRILLATION Atrial  fibrillation status: stable Satisfied with current treatment: yes  Medication side effects:  no Medication compliance: good compliance Etiology of atrial fibrillation:  Palpitations:  no Chest pain:  no Dyspnea on exertion:  no Orthopnea:  no Syncope:  no Edema:  no Ventricular rate control: diltiazem Anti-coagulation: no longer taking due to falls  GERD Continues on Prilosec with good control. GERD control status: controlled Satisfied with current treatment? yes Heartburn frequency: none Medication side effects: no  Medication compliance: stable Dysphagia: none Odynophagia:  no Hematemesis: no Blood in stool: no EGD: no   DEPRESSION Continues on Sertraline 25 MG daily and Trazodone 50 MG QHS. Mood status: stable Satisfied with current treatment?: yes Symptom severity: mild  Duration of current treatment : chronic Side effects: no Medication compliance: good compliance Depressed mood: no Anxious mood: no Anhedonia: no Significant weight loss or gain: no Insomnia: yes hard to fall asleep Fatigue: no Feelings of worthlessness or guilt: no Impaired concentration/indecisiveness: no Suicidal ideations: no Hopelessness: no Crying spells: no Depression screen [facility] 2/9 07/08/2021 01/06/2021 04/23/2020 02/18/2020 09/11/2019  Decreased Interest 0 0 0 0 0  Down, Depressed, Hopeless 0 0 0 0 0  PHQ - 2 Score 0 0 0 0 0  Altered sleeping 0 0 0 0 0  Tired, decreased energy 0 1 0 2 1  Change in appetite 0 0 0 0 0  Feeling bad or failure about yourself  0 0 0 0 0  Trouble concentrating 0 0 1 0 0  Moving slowly or fidgety/restless 0 0 1  0 0  Suicidal thoughts 0 0 0 0 0  PHQ-9 Score 0 1 2 2 1   Difficult doing work/chores Not difficult at all Not difficult at all - Not difficult at all Not difficult at all  Some recent data might be hidden   Relevant past medical, surgical, family and social history reviewed and updated as indicated. Interim medical history since our last visit  reviewed. Allergies and medications reviewed and updated.  Review of Systems  Constitutional:  Negative for activity change, appetite change, diaphoresis, fatigue and fever.  Respiratory:  Negative for cough, chest tightness and shortness of breath.   Cardiovascular:  Negative for chest pain, palpitations and leg swelling.  Gastrointestinal: Negative.   Endocrine: Negative for cold intolerance, heat intolerance, polydipsia, polyphagia and polyuria.  Neurological: Negative.   Psychiatric/Behavioral: Negative.     Per HPI unless specifically indicated above     Objective:    BP 123/83   Pulse 74   Temp 98.9 F (37.2 C) (Oral)   Wt 160 lb 9.6 oz (72.8 kg)   LMP  (LMP Unknown)   SpO2 95%   BMI 27.57 kg/m   Wt Readings from Last 3 Encounters:  07/08/21 160 lb 9.6 oz (72.8 kg)  01/06/21 161 lb (73 kg)  07/03/20 161 lb 9.6 oz (73.3 kg)    Physical Exam Vitals and nursing note reviewed.  Constitutional:      General: She is awake. She is not in acute distress.    Appearance: She is well-developed. She is not ill-appearing.  HENT:     Head: Normocephalic.     Right Ear: Hearing normal.     Left Ear: Hearing normal.     Nose: Nose normal.     Mouth/Throat:     Mouth: Mucous membranes are moist.  Eyes:     General: Lids are normal.        Right eye: No discharge.        Left eye: No discharge.     Conjunctiva/sclera: Conjunctivae normal.     Pupils: Pupils are equal, round, and reactive to light.  Neck:     Thyroid: No thyromegaly.     Vascular: No carotid bruit or JVD.  Cardiovascular:     Rate and Rhythm: Normal rate. Rhythm irregularly irregular.     Heart sounds: Murmur heard.  Systolic murmur is present with a grade of 2/6.    No gallop.     Comments: Systolic murmur with radiation to carotids. Pulmonary:     Effort: Pulmonary effort is normal.     Breath sounds: Normal breath sounds.  Abdominal:     General: Bowel sounds are normal.     Palpations: Abdomen is  soft.  Musculoskeletal:     Cervical back: Normal range of motion and neck supple.     Right lower leg: Edema (trace) present.     Left lower leg: Edema (trace) present.  Lymphadenopathy:     Cervical: No cervical adenopathy.  Skin:    General: Skin is warm and dry.  Neurological:     Mental Status: She is alert and oriented to person, place, and time.  Psychiatric:        Attention and Perception: Attention normal.        Mood and Affect: Mood normal.        Behavior: Behavior normal. Behavior is cooperative.        Thought Content: Thought content normal.        Judgment:  Judgment normal.   Results for orders placed or performed in visit on 01/06/21  Comprehensive metabolic panel  Result Value Ref Range   Glucose 115 (H) 65 - 99 mg/dL   BUN 14 10 - 36 mg/dL   Creatinine, Ser 1.610.79 0.57 - 1.00 mg/dL   GFR calc non Af Amer 66 >59 mL/min/1.73   GFR calc Af Amer 76 >59 mL/min/1.73   BUN/Creatinine Ratio 18 12 - 28   Sodium 142 134 - 144 mmol/L   Potassium 4.1 3.5 - 5.2 mmol/L   Chloride 98 96 - 106 mmol/L   CO2 29 20 - 29 mmol/L   Calcium 9.5 8.7 - 10.3 mg/dL   Total Protein 6.9 6.0 - 8.5 g/dL   Albumin 4.5 3.5 - 4.6 g/dL   Globulin, Total 2.4 1.5 - 4.5 g/dL   Albumin/Globulin Ratio 1.9 1.2 - 2.2   Bilirubin Total 0.6 0.0 - 1.2 mg/dL   Alkaline Phosphatase 78 44 - 121 IU/L   AST 19 0 - 40 IU/L   ALT 9 0 - 32 IU/L  Lipid Panel w/o Chol/HDL Ratio  Result Value Ref Range   Cholesterol, Total 164 100 - 199 mg/dL   Triglycerides 97 0 - 149 mg/dL   HDL 58 >09>39 mg/dL   VLDL Cholesterol Cal 18 5 - 40 mg/dL   LDL Chol Calc (NIH) 88 0 - 99 mg/dL  TSH  Result Value Ref Range   TSH 1.760 0.450 - 4.500 uIU/mL  HgB A1c  Result Value Ref Range   Hgb A1c MFr Bld 6.5 (H) 4.8 - 5.6 %   Est. average glucose Bld gHb Est-mCnc 140 mg/dL  CBC with Differential/Platelet  Result Value Ref Range   WBC 6.5 3.4 - 10.8 x10E3/uL   RBC 5.71 (H) 3.77 - 5.28 x10E6/uL   Hemoglobin 14.0 11.1 -  15.9 g/dL   Hematocrit 60.444.0 54.034.0 - 46.6 %   MCV 77 (L) 79 - 97 fL   MCH 24.5 (L) 26.6 - 33.0 pg   MCHC 31.8 31.5 - 35.7 g/dL   RDW 98.115.1 19.111.7 - 47.815.4 %   Platelets 190 150 - 450 x10E3/uL   Neutrophils 62 Not Estab. %   Lymphs 26 Not Estab. %   Monocytes 10 Not Estab. %   Eos 1 Not Estab. %   Basos 1 Not Estab. %   Neutrophils Absolute 4.1 1.4 - 7.0 x10E3/uL   Lymphocytes Absolute 1.7 0.7 - 3.1 x10E3/uL   Monocytes Absolute 0.7 0.1 - 0.9 x10E3/uL   EOS (ABSOLUTE) 0.0 0.0 - 0.4 x10E3/uL   Basophils Absolute 0.0 0.0 - 0.2 x10E3/uL   Immature Granulocytes 0 Not Estab. %   Immature Grans (Abs) 0.0 0.0 - 0.1 x10E3/uL      Assessment & Plan:   Problem List Items Addressed This Visit       Cardiovascular and Mediastinum   Essential hypertension    Chronic, stable with numbers at goal today for her age and at home.  Continue current medication regimen and collaboration with cardiology.  Monitor for hypotension and falls, reduce medication as needed.  CMP today.  Recommend she monitor BP at least 3 days a week at home and document for provider visits.  Focus on DASH diet at home.  Return in 6 months.      Chronic atrial fibrillation (HCC)    Chronic, ongoing.  Continue current medication regimen and collaboration with cardiology. She is to remain off ASA and Eliquis indefinitely on review of neurosurgery  and cardiology notes, she is aware of this.  High fall risk and history of subdural hematoma.  Return in 6 months.      Chronic systolic CHF (congestive heart failure) (HCC) - Primary    Chronic, ongoing.  Euvolemic today.  Continue current medication regimen and collaboration with cardiology.  - Reminded to call for an overnight weight gain of >2 pounds or a weekly weight weight of >5 pounds - not adding salt to his food and has been reading food labels. Reviewed the importance of keeping daily sodium intake to 2000mg  daily       Aortic atherosclerosis (HCC)    Noted on CXR  10/01/2016.  She is to remain off ASA, continue statin daily and adjust as needed.        Digestive   GERD without esophagitis   Relevant Medications   omeprazole (PRILOSEC) 20 MG capsule   Other Relevant Orders   Magnesium     Endocrine   IFG (impaired fasting glucose)    Ongoing.  6.5% last visit.  Goal for age is A1c <8% per geriatric society, higher risk for falls due to past falls.  Avoid medication at this time and monitor levels.      Relevant Orders   HgB A1c     Other   Hypercholesterolemia    Chronic, ongoing.  Continue current medication regimen and adjust as needed.  Lipid panel today.      Relevant Orders   Comprehensive metabolic panel   Lipid Panel w/o Chol/HDL Ratio   Depression    Chronic, stable.  Denies SI/HI.  Continue current medication regimen and adjust as needed.  Monitor NA+ level with Sertraline and advanced age.  Return in 6 months.        Insomnia    Chronic, stable with Trazodone.  Continue current regimen and adjust as needed.  Refills up to date.      History of subdural hematoma 03/28/20    No further falls since May.  She is to continue off Eliquis and ASA on review of cardiology and neurosurgery notes, she is aware of this.  Continue to closely monitor.        Follow up plan: Return in about 6 months (around 01/08/2022) for HTN/HLD, CHF, A-FIB, MOOD, A1c.

## 2021-07-08 NOTE — Patient Instructions (Signed)
https://www.nhlbi.nih.gov/files/docs/public/heart/dash_brief.pdf">  DASH Eating Plan DASH stands for Dietary Approaches to Stop Hypertension. The DASH eating plan is a healthy eating plan that has been shown to: Reduce high blood pressure (hypertension). Reduce your risk for type 2 diabetes, heart disease, and stroke. Help with weight loss. What are tips for following this plan? Reading food labels Check food labels for the amount of salt (sodium) per serving. Choose foods with less than 5 percent of the Daily Value of sodium. Generally, foods with less than 300 milligrams (mg) of sodium per serving fit into this eating plan. To find whole grains, look for the word "whole" as the first word in the ingredient list. Shopping Buy products labeled as "low-sodium" or "no salt added." Buy fresh foods. Avoid canned foods and pre-made or frozen meals. Cooking Avoid adding salt when cooking. Use salt-free seasonings or herbs instead of table salt or sea salt. Check with your health care provider or pharmacist before using salt substitutes. Do not fry foods. Cook foods using healthy methods such as baking, boiling, grilling, roasting, and broiling instead. Cook with heart-healthy oils, such as olive, canola, avocado, soybean, or sunflower oil. Meal planning  Eat a balanced diet that includes: 4 or more servings of fruits and 4 or more servings of vegetables each day. Try to fill one-half of your plate with fruits and vegetables. 6-8 servings of whole grains each day. Less than 6 oz (170 g) of lean meat, poultry, or fish each day. A 3-oz (85-g) serving of meat is about the same size as a deck of cards. One egg equals 1 oz (28 g). 2-3 servings of low-fat dairy each day. One serving is 1 cup (237 mL). 1 serving of nuts, seeds, or beans 5 times each week. 2-3 servings of heart-healthy fats. Healthy fats called omega-3 fatty acids are found in foods such as walnuts, flaxseeds, fortified milks, and eggs.  These fats are also found in cold-water fish, such as sardines, salmon, and mackerel. Limit how much you eat of: Canned or prepackaged foods. Food that is high in trans fat, such as some fried foods. Food that is high in saturated fat, such as fatty meat. Desserts and other sweets, sugary drinks, and other foods with added sugar. Full-fat dairy products. Do not salt foods before eating. Do not eat more than 4 egg yolks a week. Try to eat at least 2 vegetarian meals a week. Eat more home-cooked food and less restaurant, buffet, and fast food.  Lifestyle When eating at a restaurant, ask that your food be prepared with less salt or no salt, if possible. If you drink alcohol: Limit how much you use to: 0-1 drink a day for women who are not pregnant. 0-2 drinks a day for men. Be aware of how much alcohol is in your drink. In the [place] one drink equals one 12 oz bottle of beer (355 mL), one 5 oz glass of wine (148 mL), or one 1 oz glass of hard liquor (44 mL). General information Avoid eating more than 2,300 mg of salt a day. If you have hypertension, you may need to reduce your sodium intake to 1,500 mg a day. Work with your health care provider to maintain a healthy body weight or to lose weight. Ask what an ideal weight is for you. Get at least 30 minutes of exercise that causes your heart to beat faster (aerobic exercise) most days of the week. Activities may include walking, swimming, or biking. Work with your health care provider   or dietitian to adjust your eating plan to your individual calorie needs. What foods should I eat? Fruits All fresh, dried, or frozen fruit. Canned fruit in natural juice (without addedsugar). Vegetables Fresh or frozen vegetables (raw, steamed, roasted, or grilled). Low-sodium or reduced-sodium tomato and vegetable juice. Low-sodium or reduced-sodium tomatosauce and tomato paste. Low-sodium or reduced-sodium canned vegetables. Grains Whole-grain or  whole-wheat bread. Whole-grain or whole-wheat pasta. Brown rice. Oatmeal. Quinoa. Bulgur. Whole-grain and low-sodium cereals. Pita bread.Low-fat, low-sodium crackers. Whole-wheat flour tortillas. Meats and other proteins Skinless chicken or turkey. Ground chicken or turkey. Pork with fat trimmed off. Fish and seafood. Egg whites. Dried beans, peas, or lentils. Unsalted nuts, nut butters, and seeds. Unsalted canned beans. Lean cuts of beef with fat trimmed off. Low-sodium, lean precooked or cured meat, such as sausages or meatloaves. Dairy Low-fat (1%) or fat-free (skim) milk. Reduced-fat, low-fat, or fat-free cheeses. Nonfat, low-sodium ricotta or cottage cheese. Low-fat or nonfatyogurt. Low-fat, low-sodium cheese. Fats and oils Soft margarine without trans fats. Vegetable oil. Reduced-fat, low-fat, or light mayonnaise and salad dressings (reduced-sodium). Canola, safflower, olive, avocado, soybean, andsunflower oils. Avocado. Seasonings and condiments Herbs. Spices. Seasoning mixes without salt. Other foods Unsalted popcorn and pretzels. Fat-free sweets. The items listed above may not be a complete list of foods and beverages you can eat. Contact a dietitian for more information. What foods should I avoid? Fruits Canned fruit in a light or heavy syrup. Fried fruit. Fruit in cream or buttersauce. Vegetables Creamed or fried vegetables. Vegetables in a cheese sauce. Regular canned vegetables (not low-sodium or reduced-sodium). Regular canned tomato sauce and paste (not low-sodium or reduced-sodium). Regular tomato and vegetable juice(not low-sodium or reduced-sodium). Pickles. Olives. Grains Baked goods made with fat, such as croissants, muffins, or some breads. Drypasta or rice meal packs. Meats and other proteins Fatty cuts of meat. Ribs. Fried meat. Bacon. Bologna, salami, and other precooked or cured meats, such as sausages or meat loaves. Fat from the back of a pig (fatback). Bratwurst.  Salted nuts and seeds. Canned beans with added salt. Canned orsmoked fish. Whole eggs or egg yolks. Chicken or turkey with skin. Dairy Whole or 2% milk, cream, and half-and-half. Whole or full-fat cream cheese. Whole-fat or sweetened yogurt. Full-fat cheese. Nondairy creamers. Whippedtoppings. Processed cheese and cheese spreads. Fats and oils Butter. Stick margarine. Lard. Shortening. Ghee. Bacon fat. Tropical oils, suchas coconut, palm kernel, or palm oil. Seasonings and condiments Onion salt, garlic salt, seasoned salt, table salt, and sea salt. Worcestershire sauce. Tartar sauce. Barbecue sauce. Teriyaki sauce. Soy sauce, including reduced-sodium. Steak sauce. Canned and packaged gravies. Fish sauce. Oyster sauce. Cocktail sauce. Store-bought horseradish. Ketchup. Mustard. Meat flavorings and tenderizers. Bouillon cubes. Hot sauces. Pre-made or packaged marinades. Pre-made or packaged taco seasonings. Relishes. Regular saladdressings. Other foods Salted popcorn and pretzels. The items listed above may not be a complete list of foods and beverages you should avoid. Contact a dietitian for more information. Where to find more information National Heart, Lung, and Blood Institute: www.nhlbi.nih.gov American Heart Association: www.heart.org Academy of Nutrition and Dietetics: www.eatright.org National Kidney Foundation: www.kidney.org Summary The DASH eating plan is a healthy eating plan that has been shown to reduce high blood pressure (hypertension). It may also reduce your risk for type 2 diabetes, heart disease, and stroke. When on the DASH eating plan, aim to eat more fresh fruits and vegetables, whole grains, lean proteins, low-fat dairy, and heart-healthy fats. With the DASH eating plan, you should limit salt (sodium) intake to 2,300   mg a day. If you have hypertension, you may need to reduce your sodium intake to 1,500 mg a day. Work with your health care provider or dietitian to adjust  your eating plan to your individual calorie needs. This information is not intended to replace advice given to you by your health care provider. Make sure you discuss any questions you have with your healthcare provider. Document Revised: 10/05/2019 Document Reviewed: 10/05/2019 Elsevier Patient Education  2022 Elsevier Inc.  

## 2021-07-08 NOTE — Assessment & Plan Note (Signed)
No further falls since May.  She is to continue off Eliquis and ASA on review of cardiology and neurosurgery notes, she is aware of this.  Continue to closely monitor. 

## 2021-07-08 NOTE — Assessment & Plan Note (Signed)
Chronic, ongoing.  Continue current medication regimen and adjust as needed. Lipid panel today. 

## 2021-07-09 LAB — COMPREHENSIVE METABOLIC PANEL
ALT: 8 IU/L (ref 0–32)
AST: 16 IU/L (ref 0–40)
Albumin/Globulin Ratio: 2 (ref 1.2–2.2)
Albumin: 4.7 g/dL — ABNORMAL HIGH (ref 3.5–4.6)
Alkaline Phosphatase: 69 IU/L (ref 44–121)
BUN/Creatinine Ratio: 17 (ref 12–28)
BUN: 14 mg/dL (ref 10–36)
Bilirubin Total: 0.5 mg/dL (ref 0.0–1.2)
CO2: 26 mmol/L (ref 20–29)
Calcium: 9.3 mg/dL (ref 8.7–10.3)
Chloride: 99 mmol/L (ref 96–106)
Creatinine, Ser: 0.83 mg/dL (ref 0.57–1.00)
Globulin, Total: 2.3 g/dL (ref 1.5–4.5)
Glucose: 148 mg/dL — ABNORMAL HIGH (ref 65–99)
Potassium: 3.5 mmol/L (ref 3.5–5.2)
Sodium: 143 mmol/L (ref 134–144)
Total Protein: 7 g/dL (ref 6.0–8.5)
eGFR: 67 mL/min/{1.73_m2} (ref >59)

## 2021-07-09 LAB — LIPID PANEL W/O CHOL/HDL RATIO
Cholesterol, Total: 166 mg/dL (ref 100–199)
HDL: 57 mg/dL (ref >39)
LDL Chol Calc (NIH): 87 mg/dL (ref 0–99)
Triglycerides: 123 mg/dL (ref 0–149)
VLDL Cholesterol Cal: 22 mg/dL (ref 5–40)

## 2021-07-09 LAB — HEMOGLOBIN A1C
Est. average glucose Bld gHb Est-mCnc: 140 mg/dL
Hgb A1c MFr Bld: 6.5 % — ABNORMAL HIGH (ref 4.8–5.6)

## 2021-07-09 LAB — MAGNESIUM: Magnesium: 1.7 mg/dL (ref 1.6–2.3)

## 2021-07-09 NOTE — Progress Notes (Signed)
Please let Caidence know her labs have returned and are overall continue to be stable and at baseline for her.  Continue all current medications and enjoy your birthday!!  Happy early 89st!!   Keep being amazing!!  Thank you for allowing me to participate in your care.  I appreciate you. Kindest regards, Jameya Pontiff

## 2021-07-10 ENCOUNTER — Ambulatory Visit: Payer: Medicare Other

## 2021-07-14 DIAGNOSIS — E113293 Type 2 diabetes mellitus with mild nonproliferative diabetic retinopathy without macular edema, bilateral: Secondary | ICD-10-CM | POA: Diagnosis not present

## 2021-07-14 LAB — HM DIABETES EYE EXAM

## 2021-07-15 ENCOUNTER — Encounter: Payer: Self-pay | Admitting: Nurse Practitioner

## 2021-07-15 DIAGNOSIS — H3500 Unspecified background retinopathy: Secondary | ICD-10-CM | POA: Insufficient documentation

## 2021-07-15 DIAGNOSIS — E113299 Type 2 diabetes mellitus with mild nonproliferative diabetic retinopathy without macular edema, unspecified eye: Secondary | ICD-10-CM | POA: Insufficient documentation

## 2021-07-17 ENCOUNTER — Ambulatory Visit: Payer: Medicare Other

## 2021-07-22 ENCOUNTER — Other Ambulatory Visit: Payer: Self-pay | Admitting: Nurse Practitioner

## 2021-07-22 DIAGNOSIS — I5022 Chronic systolic (congestive) heart failure: Secondary | ICD-10-CM

## 2021-07-24 ENCOUNTER — Ambulatory Visit (INDEPENDENT_AMBULATORY_CARE_PROVIDER_SITE_OTHER): Payer: Medicare Other

## 2021-07-24 VITALS — Ht 63.0 in | Wt 159.0 lb

## 2021-07-24 DIAGNOSIS — Z0000 Encounter for general adult medical examination without abnormal findings: Secondary | ICD-10-CM | POA: Diagnosis not present

## 2021-07-24 NOTE — Patient Instructions (Signed)
Melinda Preston , Thank you for taking time to come for your Medicare Wellness Visit. I appreciate your ongoing commitment to your health goals. Please review the following plan we discussed and let me know if I can assist you in the future.   Screening recommendations/referrals: Colonoscopy: not required Mammogram: not required Bone Density: completed 04/29/2016 Recommended yearly ophthalmology/optometry visit for glaucoma screening and checkup Recommended yearly dental visit for hygiene and checkup  Vaccinations: Influenza vaccine: due Pneumococcal vaccine: completed 03/12/2015 Tdap vaccine: completed 01/31/2017, due 02/01/2027 Shingles vaccine: discussed   Covid-19: 04/27/2021, 10/30/2020, 04/07/2020, 03/10/2020  Advanced directives: Please bring a copy of your POA (Power of Attorney) and/or Living Will to your next appointment.   Conditions/risks identified: none  Next appointment: Follow up in one year for your annual wellness visit    Preventive Care 65 Years and Older, Female Preventive care refers to lifestyle choices and visits with your health care provider that can promote health and wellness. What does preventive care include? A yearly physical exam. This is also called an annual well check. Dental exams once or twice a year. Routine eye exams. Ask your health care provider how often you should have your eyes checked. Personal lifestyle choices, including: Daily care of your teeth and gums. Regular physical activity. Eating a healthy diet. Avoiding tobacco and drug use. Limiting alcohol use. Practicing safe sex. Taking low-dose aspirin every day. Taking vitamin and mineral supplements as recommended by your health care provider. What happens during an annual well check? The services and screenings done by your health care provider during your annual well check will depend on your age, overall health, lifestyle risk factors, and family history of disease. Counseling  Your  health care provider may ask you questions about your: Alcohol use. Tobacco use. Drug use. Emotional well-being. Home and relationship well-being. Sexual activity. Eating habits. History of falls. Memory and ability to understand (cognition). Work and work Astronomer. Reproductive health. Screening  You may have the following tests or measurements: Height, weight, and BMI. Blood pressure. Lipid and cholesterol levels. These may be checked every 5 years, or more frequently if you are over 7 years old. Skin check. Lung cancer screening. You may have this screening every year starting at age 18 if you have a 30-pack-year history of smoking and currently smoke or have quit within the past 15 years. Fecal occult blood test (FOBT) of the stool. You may have this test every year starting at age 16. Flexible sigmoidoscopy or colonoscopy. You may have a sigmoidoscopy every 5 years or a colonoscopy every 10 years starting at age 73. Hepatitis C blood test. Hepatitis B blood test. Sexually transmitted disease (STD) testing. Diabetes screening. This is done by checking your blood sugar (glucose) after you have not eaten for a while (fasting). You may have this done every 1-3 years. Bone density scan. This is done to screen for osteoporosis. You may have this done starting at age 48. Mammogram. This may be done every 1-2 years. Talk to your health care provider about how often you should have regular mammograms. Talk with your health care provider about your test results, treatment options, and if necessary, the need for more tests. Vaccines  Your health care provider may recommend certain vaccines, such as: Influenza vaccine. This is recommended every year. Tetanus, diphtheria, and acellular pertussis (Tdap, Td) vaccine. You may need a Td booster every 10 years. Zoster vaccine. You may need this after age 87. Pneumococcal 13-valent conjugate (PCV13) vaccine. One dose  is recommended after age  65. Pneumococcal polysaccharide (PPSV23) vaccine. One dose is recommended after age 7. Talk to your health care provider about which screenings and vaccines you need and how often you need them. This information is not intended to replace advice given to you by your health care provider. Make sure you discuss any questions you have with your health care provider. Document Released: 11/28/2015 Document Revised: 07/21/2016 Document Reviewed: 09/02/2015 Elsevier Interactive Patient Education  2017 [place] Prevention in the Home Falls can cause injuries. They can happen to people of all ages. There are many things you can do to make your home safe and to help prevent falls. What can I do on the outside of my home? Regularly fix the edges of walkways and driveways and fix any cracks. Remove anything that might make you trip as you walk through a door, such as a raised step or threshold. Trim any bushes or trees on the path to your home. Use bright outdoor lighting. Clear any walking paths of anything that might make someone trip, such as rocks or tools. Regularly check to see if handrails are loose or broken. Make sure that both sides of any steps have handrails. Any raised decks and porches should have guardrails on the edges. Have any leaves, snow, or ice cleared regularly. Use sand or salt on walking paths during winter. Clean up any spills in your garage right away. This includes oil or grease spills. What can I do in the bathroom? Use night lights. Install grab bars by the toilet and in the tub and shower. Do not use towel bars as grab bars. Use non-skid mats or decals in the tub or shower. If you need to sit down in the shower, use a plastic, non-slip stool. Keep the floor dry. Clean up any water that spills on the floor as soon as it happens. Remove soap buildup in the tub or shower regularly. Attach bath mats securely with double-sided non-slip rug tape. Do not have throw  rugs and other things on the floor that can make you trip. What can I do in the bedroom? Use night lights. Make sure that you have a light by your bed that is easy to reach. Do not use any sheets or blankets that are too big for your bed. They should not hang down onto the floor. Have a firm chair that has side arms. You can use this for support while you get dressed. Do not have throw rugs and other things on the floor that can make you trip. What can I do in the kitchen? Clean up any spills right away. Avoid walking on wet floors. Keep items that you use a lot in easy-to-reach places. If you need to reach something above you, use a strong step stool that has a grab bar. Keep electrical cords out of the way. Do not use floor polish or wax that makes floors slippery. If you must use wax, use non-skid floor wax. Do not have throw rugs and other things on the floor that can make you trip. What can I do with my stairs? Do not leave any items on the stairs. Make sure that there are handrails on both sides of the stairs and use them. Fix handrails that are broken or loose. Make sure that handrails are as long as the stairways. Check any carpeting to make sure that it is firmly attached to the stairs. Fix any carpet that is loose or worn. Avoid  having throw rugs at the top or bottom of the stairs. If you do have throw rugs, attach them to the floor with carpet tape. Make sure that you have a light switch at the top of the stairs and the bottom of the stairs. If you do not have them, ask someone to add them for you. What else can I do to help prevent falls? Wear shoes that: Do not have high heels. Have rubber bottoms. Are comfortable and fit you well. Are closed at the toe. Do not wear sandals. If you use a stepladder: Make sure that it is fully opened. Do not climb a closed stepladder. Make sure that both sides of the stepladder are locked into place. Ask someone to hold it for you, if  possible. Clearly mark and make sure that you can see: Any grab bars or handrails. First and last steps. Where the edge of each step is. Use tools that help you move around (mobility aids) if they are needed. These include: Canes. Walkers. Scooters. Crutches. Turn on the lights when you go into a dark area. Replace any light bulbs as soon as they burn out. Set up your furniture so you have a clear path. Avoid moving your furniture around. If any of your floors are uneven, fix them. If there are any pets around you, be aware of where they are. Review your medicines with your doctor. Some medicines can make you feel dizzy. This can increase your chance of falling. Ask your doctor what other things that you can do to help prevent falls. This information is not intended to replace advice given to you by your health care provider. Make sure you discuss any questions you have with your health care provider. Document Released: 08/28/2009 Document Revised: 04/08/2016 Document Reviewed: 12/06/2014 Elsevier Interactive Patient Education  2017 [employer].

## 2021-07-24 NOTE — Progress Notes (Signed)
I connected with Melinda Preston today by telephone and verified that I am speaking with the correct person using two identifiers. Location patient: home Location provider: work Persons participating in the virtual visit: Victoire, Deans LPN.   I discussed the limitations, risks, security and privacy concerns of performing an evaluation and management service by telephone and the availability of in person appointments. I also discussed with the patient that there may be a patient responsible charge related to this service. The patient expressed understanding and verbally consented to this telephonic visit.    Interactive audio and video telecommunications were attempted between this provider and patient, however failed, due to patient having technical difficulties OR patient did not have access to video capability.  We continued and completed visit with audio only.     Vital signs may be patient reported or missing.  Subjective:   Melinda Preston is a 87 y.o. female who presents for Medicare Annual (Subsequent) preventive examination.  Review of Systems     Cardiac Risk Factors include: advanced age (>49men, >13 women);diabetes mellitus;hypertension;sedentary lifestyle     Objective:    Today's Vitals   07/24/21 1026  Weight: 159 lb (72.1 kg)  Height: 5\' 3"  (1.6 m)   Body mass index is 28.17 kg/m.  Advanced Directives 07/24/2021 04/16/2020 04/15/2020 04/04/2020 02/18/2020 07/07/2017 03/22/2016  Does Patient Have a Medical Advance Directive? Yes Yes No No Yes Yes No  Type of 05/22/2016 of [place];Living will (No Data) - - Living will;Healthcare Power of [place];Living will -  Does patient want to make changes to medical advance directive? - No - Patient declined - - - - -  Copy of Healthcare Power of Attorney in Chart? No - copy requested - - - No - copy requested No - copy requested -  Would patient like  information on creating a medical advance directive? - No - Patient declined No - Patient declined - - - -    Current Medications (verified) Outpatient Encounter Medications as of 07/24/2021  Medication Sig   atorvastatin (LIPITOR) 20 MG tablet TAKE 1 TABLET(20 MG) BY MOUTH AT BEDTIME   Calcium Carb-Cholecalciferol (CALCIUM 600+D3 PO) Take 1 tablet by mouth daily.    carvedilol (COREG) 3.125 MG tablet Take 1 tablet (3.125 mg total) by mouth 2 (two) times daily with a meal.   diltiazem (CARDIZEM CD) 180 MG 24 hr capsule TAKE 1 CAPSULE(180 MG) BY MOUTH DAILY   furosemide (LASIX) 40 MG tablet Take 40 mg by mouth 2 (two) times daily.   losartan (COZAAR) 25 MG tablet TAKE 1 TABLET(25 MG) BY MOUTH DAILY   Omega-3 Fatty Acids (FISH OIL) 1000 MG CAPS Take 1,000 mg by mouth daily.   omeprazole (PRILOSEC) 20 MG capsule TAKE 1 CAPSULE(20 MG) BY MOUTH DAILY   potassium chloride SA (KLOR-CON) 20 MEQ tablet TAKE 1 TABLET(20 MEQ) BY MOUTH DAILY   sertraline (ZOLOFT) 25 MG tablet TAKE 1 TABLET(25 MG) BY MOUTH DAILY   traZODone (DESYREL) 50 MG tablet TAKE 1 TABLET(50 MG) BY MOUTH AT BEDTIME   VITAMIN D, CHOLECALCIFEROL, PO Take 600 Units by mouth daily.    No facility-administered encounter medications on file as of 07/24/2021.    Allergies (verified) Lisinopril   History: Past Medical History:  Diagnosis Date   Anxiety    Aortic heart valve narrowing    Atrial fibrillation (HCC)    Diabetes mellitus without complication (HCC)    Disorder of aorta (HCC)  Hyperlipidemia    Hypertension    Insomnia    Overactive bladder    Past Surgical History:  Procedure Laterality Date   APPENDECTOMY     rotator cuff surgery Left    Family History  Problem Relation Age of Onset   Heart disease Father    Breast cancer Sister    Heart disease Brother    ADD / ADHD Daughter    Diabetes Son    Social History   Socioeconomic History   Marital status: Widowed    Spouse name: Not on file   Number of  children: Not on file   Years of education: Not on file   Highest education level: Not on file  Occupational History   Not on file  Tobacco Use   Smoking status: Never   Smokeless tobacco: Never  Vaping Use   Vaping Use: Never used  Substance and Sexual Activity   Alcohol use: No   Drug use: No   Sexual activity: Not Currently  Other Topics Concern   Not on file  Social History Narrative   Not on file   Social Determinants of Health   Financial Resource Strain: Low Risk    Difficulty of Paying Living Expenses: Not hard at all  Food Insecurity: No Food Insecurity   Worried About Running Out of Food in the Last Year: Never true   Ran Out of Food in the Last Year: Never true  Transportation Needs: No Transportation Needs   Lack of Transportation (Medical): No   Lack of Transportation (Non-Medical): No  Physical Activity: Inactive   Days of Exercise per Week: 0 days   Minutes of Exercise per Session: 0 min  Stress: No Stress Concern Present   Feeling of Stress : Not at all  Social Connections: Not on file    Tobacco Counseling Counseling given: Not Answered   Clinical Intake:  Pre-visit preparation completed: Yes  Pain : No/denies pain     Nutritional Status: BMI 25 -29 Overweight Nutritional Risks: None Diabetes: Yes  How often do you need to have someone help you when you read instructions, pamphlets, or other written materials from your doctor or pharmacy?: 1 - Never  Diabetic? Yes Nutrition Risk Assessment:  Has the patient had any N/V/D within the last 2 months?  No  Does the patient have any non-healing wounds?  No  Has the patient had any unintentional weight loss or weight gain?  No   Diabetes:  Is the patient diabetic?  Yes  If diabetic, was a CBG obtained today?  No  Did the patient bring in their glucometer from home?  No  How often do you monitor your CBG's? Does not check.   Financial Strains and Diabetes Management:  Are you having any  financial strains with the device, your supplies or your medication? No .  Does the patient want to be seen by Chronic Care Management for management of their diabetes?  No  Would the patient like to be referred to a Nutritionist or for Diabetic Management?  No   Diabetic Exams:  Diabetic Eye Exam: Completed 07/14/2021 Diabetic Foot Exam: N/A   Interpreter Needed?: No  Information entered by :: NAllen LPN   Activities of Daily Living In your present state of health, do you have any difficulty performing the following activities: 07/24/2021 07/08/2021  Hearing? N N  Vision? Y N  Difficulty concentrating or making decisions? Y N  Comment at times -  Walking or climbing stairs?  N N  Dressing or bathing? N N  Doing errands, shopping? N N  Preparing Food and eating ? N -  Using the Toilet? N -  In the past six months, have you accidently leaked urine? Y -  Do you have problems with loss of bowel control? N -  Managing your Medications? N -  Managing your Finances? N -  Housekeeping or managing your Housekeeping? N -  Some recent data might be hidden    Patient Care Team: Marjie Skiffannady, Jolene T, NP as PCP - General (Nurse Practitioner) Marcina MillardParaschos, Alexander, MD as Consulting Physician (Cardiology) Anson OregonMcGhee, James Lance, PA-C as Physician Assistant (Orthopedic Surgery)  Indicate any recent Medical Services you may have received from other than [facility] providers in the past year (date may be approximate).     Assessment:   This [facility]  Hearing/Vision screen Vision Screening - Comments:: Regular eye exams, [facility]  Dietary issues and exercise activities discussed: Current Exercise Habits: The patient does not participate in regular exercise at present   Goals Addressed             This Visit's Progress    Patient Stated       07/24/2021, stay healthy       Depression Screen PHQ 2/9 Scores 07/24/2021 07/08/2021 01/06/2021 04/23/2020  02/18/2020 09/11/2019 06/05/2019  PHQ - 2 Score 0 0 0 0 0 0 1  PHQ- 9 Score - 0 1 2 2 1 3     Fall Risk Fall Risk  07/24/2021 07/08/2021 04/23/2020 04/04/2020 02/18/2020  Falls in the past year? 1 1 1 1 1   Comment tripped on feet - - - -  Number falls in past yr: 1 1 1 1  0  Injury with Fall? 0 0 1 1 0  Risk for fall due to : Impaired mobility;Medication side effect History of fall(s) - - -  Follow up Falls evaluation completed;Education provided;Falls prevention discussed Falls evaluation completed - Falls evaluation completed -    FALL RISK PREVENTION PERTAINING TO THE HOME:  Any stairs in or around the home? Yes  If so, are there any without handrails? No  Home free of loose throw rugs in walkways, pet beds, electrical cords, etc? Yes  Adequate lighting in your home to reduce risk of falls? Yes   ASSISTIVE DEVICES UTILIZED TO PREVENT FALLS:  Life alert? No  Use of a cane, walker or w/c? Yes  Grab bars in the bathroom? Yes  Shower chair or bench in shower? Yes  Elevated toilet seat or a handicapped toilet? Yes   TIMED UP AND GO:  Was the test performed? No .      Cognitive Function:     6CIT Screen 07/24/2021 07/07/2017  What Year? 0 points 0 points  What month? 0 points 0 points  What time? 0 points 0 points  Count back from 20 4 points 0 points  Months in reverse 2 points 0 points  Repeat phrase 8 points 2 points  Total Score 14 2    Immunizations Immunization History  Administered Date(s) Administered   Fluad Quad(high Dose 65+) 09/11/2019, 09/02/2020   Influenza, High Dose Seasonal PF 08/29/2015, 09/17/2016, 08/09/2017, 08/25/2018   Influenza, Seasonal, Injecte, Preservative Fre 10/29/2013   Influenza,inj,Quad PF,6+ Mos 10/30/2014   Influenza-Unspecified 08/29/2015, 09/02/2020   Moderna Sars-Covid-2 Vaccination 03/10/2020, 04/07/2020, 10/30/2020, 04/27/2021   Pneumococcal Conjugate-13 04/23/2014   Pneumococcal Polysaccharide-23 03/12/2015   Tdap 01/31/2017   Zoster,  Live 07/15/2006  TDAP status: Up to date  Flu Vaccine status: Due, Education has been provided regarding the importance of this vaccine. Advised may receive this vaccine at local pharmacy or Health Dept. Aware to provide a copy of the vaccination record if obtained from local pharmacy or Health Dept. Verbalized acceptance and understanding.  Pneumococcal vaccine status: Up to date  Covid-19 vaccine status: Completed vaccines  Qualifies for Shingles Vaccine? Yes   Zostavax completed Yes   Shingrix Completed?: No.    Education has been provided regarding the importance of this vaccine. Patient has been advised to call insurance company to determine out of pocket expense if they have not yet received this vaccine. Advised may also receive vaccine at local pharmacy or Health Dept. Verbalized acceptance and understanding.  Screening Tests Health Maintenance  Topic Date Due   Zoster Vaccines- Shingrix (1 of 2) Never done   INFLUENZA VACCINE  06/15/2021   COVID-19 Vaccine (5 - Booster for Moderna series) 08/27/2021   OPHTHALMOLOGY EXAM  07/14/2022   TETANUS/TDAP  02/01/2027   DEXA SCAN  Completed   PNA vac Low Risk Adult  Completed   HPV VACCINES  Aged Out   FOOT EXAM  Discontinued   HEMOGLOBIN A1C  Discontinued    Health Maintenance  Health Maintenance Due  Topic Date Due   Zoster Vaccines- Shingrix (1 of 2) Never done   INFLUENZA VACCINE  06/15/2021    Colorectal cancer screening: No longer required.   Mammogram status: No longer required due to age.  Bone Density status: Completed 04/29/2016.   Lung Cancer Screening: (Low Dose CT Chest recommended if Age 28-80 years, 30 pack-year currently smoking OR have quit w/in 15years.) does not qualify.   Lung Cancer Screening Referral: no  Additional Screening:  Hepatitis C Screening: does not qualify;  Vision Screening: Recommended annual ophthalmology exams for early detection of glaucoma and other disorders of the eye. Is  the patient up to date with their annual eye exam?  Yes  Who is the provider or what is the name of the office in which the patient attends annual eye exams? [facility] If pt is not established with a provider, would they like to be referred to a provider to establish care? No .   Dental Screening: Recommended annual dental exams for proper oral hygiene  Community Resource Referral / Chronic Care Management: CRR required this visit?  No   CCM required this visit?  No      Plan:     I have personally reviewed and noted the following in the patient's chart:   Medical and social history Use of alcohol, tobacco or illicit drugs  Current medications and supplements including opioid prescriptions.  Functional ability and status Nutritional status Physical activity Advanced directives List of other physicians Hospitalizations, surgeries, and ER visits in previous 12 months Vitals Screenings to include cognitive, depression, and falls Referrals and appointments  In addition, I have reviewed and discussed with patient certain preventive protocols, quality metrics, and best practice recommendations. A written personalized care plan for preventive services as well as general preventive health recommendations were provided to patient.     Barb Merino, LPN   12/20/2351   Nurse Notes:

## 2021-08-07 ENCOUNTER — Other Ambulatory Visit: Payer: Self-pay | Admitting: Nurse Practitioner

## 2021-08-25 DIAGNOSIS — I5022 Chronic systolic (congestive) heart failure: Secondary | ICD-10-CM | POA: Diagnosis not present

## 2021-08-25 DIAGNOSIS — S065XAA Traumatic subdural hemorrhage with loss of consciousness status unknown, initial encounter: Secondary | ICD-10-CM | POA: Diagnosis not present

## 2021-08-25 DIAGNOSIS — E7800 Pure hypercholesterolemia, unspecified: Secondary | ICD-10-CM | POA: Diagnosis not present

## 2021-08-25 DIAGNOSIS — I420 Dilated cardiomyopathy: Secondary | ICD-10-CM | POA: Diagnosis not present

## 2021-08-25 DIAGNOSIS — R0602 Shortness of breath: Secondary | ICD-10-CM | POA: Diagnosis not present

## 2021-08-25 DIAGNOSIS — E1121 Type 2 diabetes mellitus with diabetic nephropathy: Secondary | ICD-10-CM | POA: Diagnosis not present

## 2021-08-25 DIAGNOSIS — I4819 Other persistent atrial fibrillation: Secondary | ICD-10-CM | POA: Diagnosis not present

## 2021-08-25 DIAGNOSIS — I340 Nonrheumatic mitral (valve) insufficiency: Secondary | ICD-10-CM | POA: Diagnosis not present

## 2021-08-25 DIAGNOSIS — I10 Essential (primary) hypertension: Secondary | ICD-10-CM | POA: Diagnosis not present

## 2021-09-22 ENCOUNTER — Ambulatory Visit (INDEPENDENT_AMBULATORY_CARE_PROVIDER_SITE_OTHER): Payer: Medicare Other

## 2021-09-22 ENCOUNTER — Other Ambulatory Visit: Payer: Self-pay

## 2021-09-22 DIAGNOSIS — Z23 Encounter for immunization: Secondary | ICD-10-CM

## 2021-09-22 NOTE — Progress Notes (Signed)
Patient presents today for her HD flu vaccination, given in her left deltoid, patient tolerated well.

## 2021-10-17 ENCOUNTER — Other Ambulatory Visit: Payer: Self-pay | Admitting: Nurse Practitioner

## 2021-10-18 NOTE — Telephone Encounter (Signed)
Requested Prescriptions  Pending Prescriptions Disp Refills  . diltiazem (CARDIZEM CD) 180 MG 24 hr capsule [Pharmacy Med Name: DILTIAZEM CD 180MG  CAPSULES (24 HR)] 90 capsule 1    Sig: TAKE 1 CAPSULE(180 MG) BY MOUTH DAILY     Cardiovascular:  Calcium Channel Blockers Passed - 10/17/2021  7:11 AM      Passed - Last BP in normal range    BP Readings from Last 1 Encounters:  07/08/21 123/83         Passed - Valid encounter within last 6 months    Recent Outpatient Visits          3 months ago Chronic systolic CHF (congestive heart failure) (HCC)   [facility] [place], Jolene T, NP   9 months ago Chronic systolic CHF (congestive heart failure) (HCC)   [facility] [place], [place] T, NP   1 year ago Chronic atrial fibrillation [facility])   [facility] [place], [place] T, NP   1 year ago History of subdural hematoma 03/28/20   [facility] [facility], [place] T, NP   1 year ago Acute non intractable tension-type headache   [facility], NP      Future Appointments            In 2 months [name], Valentino Nose, NP [facility] Rank, [facility]   In 9 months  [facility], [facility]           . atorvastatin (LIPITOR) 20 MG tablet [Pharmacy Med Name: ATORVASTATIN 20MG  TABLETS] 90 tablet 2    Sig: TAKE 1 TABLET(20 MG) BY MOUTH AT BEDTIME     Cardiovascular:  Antilipid - Statins Passed - 10/17/2021  7:11 AM      Passed - Total Cholesterol in normal range and within 360 days    Cholesterol, Total  Date Value Ref Range Status  07/08/2021 166 100 - 199 mg/dL Final   Cholesterol Piccolo, Waived  Date Value Ref Range Status  12/22/2015 162 <200 mg/dL Final    Comment:                            Desirable                <200                         Borderline High      200- 239                         High                     >239          Passed - LDL in normal range and within 360 days    LDL Chol Calc  (NIH)  Date Value Ref Range Status  07/08/2021 87 0 - 99 mg/dL Final         Passed - HDL in normal range and within 360 days    HDL  Date Value Ref Range Status  07/08/2021 57 >39 mg/dL Final         Passed - Triglycerides in normal range and within 360 days    Triglycerides  Date Value Ref Range Status  07/08/2021 123 0 - 149 mg/dL Final   Triglycerides Piccolo,Waived  Date Value Ref Range Status  12/22/2015 137 <  150 mg/dL Final    Comment:                            Normal                   <150                         Borderline High     150 - 199                         High                200 - 499                         Very High                >499          Passed - Patient is not pregnant      Passed - Valid encounter within last 12 months    Recent Outpatient Visits          3 months ago Chronic systolic CHF (congestive heart failure) (HCC)   [facility] [name], Jolene T, NP   9 months ago Chronic systolic CHF (congestive heart failure) (HCC)   [facility] [place], Corrie Dandy T, NP   1 year ago Chronic atrial fibrillation (HCC)   [facility] [place], Corrie Dandy T, NP   1 year ago History of subdural hematoma 03/28/20   [facility] [place], Corrie Dandy T, NP   1 year ago Acute non intractable tension-type headache   [facility] Valentino Nose, NP      Future Appointments            In 2 months [name], Dorie Rank, NP [employer], [facility]   In 9 months  [facility], [facility]           . traZODone (DESYREL) 50 MG tablet [employer] Med Name: TRAZODONE 50MG  TABLETS] 90 tablet 0    Sig: TAKE 1 TABLET(50 MG) BY MOUTH AT BEDTIME     Psychiatry: Antidepressants - Serotonin Modulator Passed - 10/17/2021  7:11 AM      Passed - Completed PHQ-2 or PHQ-9 in the last 360 days      Passed - Valid encounter within last 6 months    Recent Outpatient Visits          3 months ago Chronic  systolic CHF (congestive heart failure) (HCC)   [facility] [name], Jolene T, NP   9 months ago Chronic systolic CHF (congestive heart failure) (HCC)   [facility] [place], [place] T, NP   1 year ago Chronic atrial fibrillation [facility])   [facility] [place], [place] T, NP   1 year ago History of subdural hematoma 03/28/20   [facility] [place], [place] T, NP   1 year ago Acute non intractable tension-type headache   [facility], NP      Future Appointments            In 2 months [name], Valentino Nose, NP [facility] Rank, [facility]   In 9 months  [facility], [facility]           . losartan (COZAAR) 25 MG tablet [employer] Med  Name: LOSARTAN 25MG  TABLETS] 90 tablet 0    Sig: TAKE 1 TABLET(25 MG) BY MOUTH DAILY     Cardiovascular:  Angiotensin Receptor Blockers Passed - 10/17/2021  7:11 AM      Passed - Cr in normal range and within 180 days    Creatinine  Date Value Ref Range Status  06/20/2012 0.75 0.60 - 1.30 mg/dL Final   Creatinine, Ser  Date Value Ref Range Status  07/08/2021 0.83 0.57 - 1.00 mg/dL Final         Passed - K in normal range and within 180 days    Potassium  Date Value Ref Range Status  07/08/2021 3.5 3.5 - 5.2 mmol/L Final  06/20/2012 3.4 (L) 3.5 - 5.1 mmol/L Final         Passed - Patient is not pregnant      Passed - Last BP in normal range    BP Readings from Last 1 Encounters:  07/08/21 123/83         Passed - Valid encounter within last 6 months    Recent Outpatient Visits          3 months ago Chronic systolic CHF (congestive heart failure) (HCC)   [facility] [name], Jolene T, NP   9 months ago Chronic systolic CHF (congestive heart failure) (HCC)   [facility] [place], [place] T, NP   1 year ago Chronic atrial fibrillation [facility])   [facility] [place], [place] T, NP   1 year ago History of subdural hematoma  03/28/20   [facility] [place], [place] T, NP   1 year ago Acute non intractable tension-type headache   [facility], NP      Future Appointments            In 2 months [name], Valentino Nose, NP [facility], [facility]   In 9 months  [facility], [facility]

## 2021-12-11 ENCOUNTER — Ambulatory Visit: Payer: Self-pay

## 2021-12-11 ENCOUNTER — Telehealth: Payer: Self-pay

## 2021-12-11 DIAGNOSIS — R1310 Dysphagia, unspecified: Secondary | ICD-10-CM

## 2021-12-11 NOTE — Telephone Encounter (Signed)
Pt called saying she needs a gi referral to have her esophagus stretched.  She said she has had it done before.  She has an appt with Jolene in Feb 24th but she wants to come in before then.  She has been having problems again with swallowing.  This happens from time to time and she has to have the procedure.   CB#  [phone]     Chief Complaint: Difficulty swallowing food Symptoms: No pain Frequency: Started last Sunday Pertinent Negatives: Patient denies pain or difficulty breathing Disposition: [] ED /[] Urgent Care (no appt availability in office) / [] Appointment(In office/virtual)/ []  [facility]/ [] Home Care/ [] Refused Recommended Disposition /[] [facility]/  Follow-up with PCP Additional Notes: Pt. Reports 4 years ago "the GI doctor had to stretch my esophagus and I need to have it done again."  Having difficulty swallowing food and liquids. Asking for referral. Please advise pt. Answer Assessment - Initial Assessment Questions 1. SYMPTOM: "Are you having difficulty swallowing liquids, solids, or both?"     Food and liquids 2. ONSET: "When did the swallowing problems begin?"      Sunday 3. CAUSE: "What do you think is causing the problem?"      Esophagus 4. CHRONIC/RECURRENT: "Is this a new problem for you?"  If no, ask: "How long have you had this problem?" (e.g., days, weeks, months)      Recurrent 5. OTHER SYMPTOMS: "Do you have any other symptoms?" (e.g., difficulty breathing, sore throat, swollen tongue, chest pain)     No 6. PREGNANCY: "Is there any chance you are pregnant?" "When was your last menstrual period?"     No  Protocols used: Swallowing Difficulty-A-AH

## 2021-12-11 NOTE — Telephone Encounter (Signed)
Scheduled patient for in person visit

## 2021-12-11 NOTE — Addendum Note (Signed)
Addended by: Aura Dials T on: 12/11/2021 11:57 AM   Modules accepted: Orders

## 2021-12-15 NOTE — Addendum Note (Signed)
Addended by: Aura Dials T on: 12/15/2021 09:38 AM   Modules accepted: Orders

## 2021-12-17 ENCOUNTER — Telehealth: Payer: Self-pay | Admitting: Nurse Practitioner

## 2021-12-17 NOTE — Telephone Encounter (Signed)
Copied from CRM [id number]. Topic: Appointment Scheduling - Scheduling Inquiry for Clinic >> Dec 16, 2021  2:48 PM Crist Infante wrote: Reason for CRM: pt wants to know if Jolene wants her to come to her appt Friday w/Jolene  since she has appt scheduled with GI dr  on 2/09 b/c that is same issue seeing Jolene about

## 2021-12-17 NOTE — Telephone Encounter (Signed)
Pt aware.

## 2021-12-18 ENCOUNTER — Ambulatory Visit: Payer: Medicare Other | Admitting: Nurse Practitioner

## 2021-12-18 DIAGNOSIS — I700 Atherosclerosis of aorta: Secondary | ICD-10-CM

## 2021-12-18 DIAGNOSIS — R7301 Impaired fasting glucose: Secondary | ICD-10-CM

## 2021-12-18 DIAGNOSIS — E7800 Pure hypercholesterolemia, unspecified: Secondary | ICD-10-CM

## 2021-12-18 DIAGNOSIS — I5022 Chronic systolic (congestive) heart failure: Secondary | ICD-10-CM

## 2021-12-18 DIAGNOSIS — F325 Major depressive disorder, single episode, in full remission: Secondary | ICD-10-CM

## 2021-12-18 DIAGNOSIS — I10 Essential (primary) hypertension: Secondary | ICD-10-CM

## 2021-12-18 DIAGNOSIS — I482 Chronic atrial fibrillation, unspecified: Secondary | ICD-10-CM

## 2021-12-24 ENCOUNTER — Encounter: Payer: Self-pay | Admitting: Gastroenterology

## 2021-12-24 ENCOUNTER — Other Ambulatory Visit: Payer: Self-pay

## 2021-12-24 ENCOUNTER — Other Ambulatory Visit: Payer: Self-pay | Admitting: Nurse Practitioner

## 2021-12-24 ENCOUNTER — Ambulatory Visit (INDEPENDENT_AMBULATORY_CARE_PROVIDER_SITE_OTHER): Payer: Medicare Other | Admitting: Gastroenterology

## 2021-12-24 VITALS — BP 150/84 | HR 76 | Temp 98.3°F | Wt 152.6 lb

## 2021-12-24 DIAGNOSIS — R1310 Dysphagia, unspecified: Secondary | ICD-10-CM

## 2021-12-24 DIAGNOSIS — I5022 Chronic systolic (congestive) heart failure: Secondary | ICD-10-CM

## 2021-12-24 NOTE — Patient Instructions (Signed)
Please give [place] a call in case you have problems swallowing again. Take care!

## 2021-12-24 NOTE — Telephone Encounter (Signed)
Requested Prescriptions  Pending Prescriptions Disp Refills   potassium chloride SA (KLOR-CON M) 20 MEQ tablet [Pharmacy Med Name: POTASSIUM CL ER TABLETS] 90 tablet 0    Sig: TAKE 1 TABLET(20 MEQ) BY MOUTH DAILY     Endocrinology:  Minerals - Potassium Supplementation Passed - 12/24/2021  8:58 AM      Passed - K in normal range and within 360 days    Potassium  Date Value Ref Range Status  07/08/2021 3.5 3.5 - 5.2 mmol/L Final  06/20/2012 3.4 (L) 3.5 - 5.1 mmol/L Final         Passed - Cr in normal range and within 360 days    Creatinine  Date Value Ref Range Status  06/20/2012 0.75 0.60 - 1.30 mg/dL Final   Creatinine, Ser  Date Value Ref Range Status  07/08/2021 0.83 0.57 - 1.00 mg/dL Final         Passed - Valid encounter within last 12 months    Recent Outpatient Visits          5 months ago Chronic systolic CHF (congestive heart failure) (HCC)   [facility] [name], Jolene T, NP   11 months ago Chronic systolic CHF (congestive heart failure) (HCC)   [facility] [place], Corrie Dandy T, NP   1 year ago Chronic atrial fibrillation [facility])   [facility] [place], Corrie Dandy T, NP   1 year ago History of subdural hematoma 03/28/20   [facility] [place], Corrie Dandy T, NP   1 year ago Acute non intractable tension-type headache   [facility] Valentino Nose, NP      Future Appointments            In 2 weeks [name], Dorie Rank, NP [facility], [facility]   In 7 months  [facility], [facility]

## 2021-12-24 NOTE — Progress Notes (Signed)
Jonathon Bellows MD, MRCP(U.K) [address]  [place]  [place], [place] [address]  Main: [phone]  Fax: [phone]   Gastroenterology Consultation  Referring Provider:     Venita Lick, NP Primary Care Physician:  Venita Lick, NP Primary Gastroenterologist:  Dr. Jonathon Bellows  Reason for Consultation:     Dysphagia        HPI:   Melinda Preston is a 88 y.o. y/o female referred for consultation & management  by Venita Lick, NP.  =   She states that 2 weeks back she called her primary care provider with complaints of difficulty swallowing solids and liquids similar to an episode she had 40 years back when she needed to have her esophagus stretched.  She thought she needed to have a follow-up and have her esophagus stretched and requested GI appointment.  She states that within a few days all symptoms resolved and presently has no issues swallowing whatsoever.  In fact she was surprised she had an appointment today  Past Medical History:  Diagnosis Date   Anxiety    Aortic heart valve narrowing    Atrial fibrillation ([place])    Diabetes mellitus without complication ([place])    Disorder of aorta (HCC)    Hyperlipidemia    Hypertension    Insomnia    Overactive bladder     Past Surgical History:  Procedure Laterality Date   APPENDECTOMY     rotator cuff surgery Left     Prior to Admission medications   Medication Sig Start Date End Date Taking? Authorizing Provider  atorvastatin (LIPITOR) 20 MG tablet TAKE 1 TABLET(20 MG) BY MOUTH AT BEDTIME 10/18/21   [name], Henrine Screws T, NP  Calcium Carb-Cholecalciferol (CALCIUM 600+D3 PO) Take 1 tablet by mouth daily.     [provider]  carvedilol (COREG) 3.125 MG tablet Take 1 tablet (3.125 mg total) by mouth 2 (two) times daily with a meal. 02/21/18   Kathrine Haddock, NP  diltiazem (CARDIZEM CD) 180 MG 24 hr capsule TAKE 1 CAPSULE(180 MG) BY MOUTH DAILY 10/18/21   [name], Henrine Screws T, NP  furosemide (LASIX)  40 MG tablet Take 40 mg by mouth 2 (two) times daily.    [provider]  losartan (COZAAR) 25 MG tablet TAKE 1 TABLET(25 MG) BY MOUTH DAILY 10/18/21   [name], Jolene T, NP  Omega-3 Fatty Acids (FISH OIL) 1000 MG CAPS Take 1,000 mg by mouth daily.    [provider]  omeprazole (PRILOSEC) 20 MG capsule TAKE 1 CAPSULE(20 MG) BY MOUTH DAILY 08/07/21   [name], Jolene T, NP  potassium chloride SA (KLOR-CON M) 20 MEQ tablet TAKE 1 TABLET(20 MEQ) BY MOUTH DAILY 12/24/21   [name], Jolene T, NP  sertraline (ZOLOFT) 25 MG tablet TAKE 1 TABLET(25 MG) BY MOUTH DAILY 07/22/21   [name], Jolene T, NP  traZODone (DESYREL) 50 MG tablet TAKE 1 TABLET(50 MG) BY MOUTH AT BEDTIME 10/18/21   [name], Jolene T, NP  VITAMIN D, CHOLECALCIFEROL, PO Take 600 Units by mouth daily.     [provider]    Family History  Problem Relation Age of Onset   Heart disease Father    Breast cancer Sister    Heart disease Brother    ADD / ADHD Daughter    Diabetes Son      Social History   Tobacco Use   Smoking status: Never   Smokeless tobacco: Never  Vaping Use   Vaping Use: Never used  Substance Use Topics   Alcohol use: No   Drug use: No    Allergies as of 12/24/2021 - Review Complete 12/24/2021  Allergen Reaction Noted   Lisinopril Cough 01/31/2017    Review of Systems:    All systems reviewed and negative except where noted in HPI.   Physical Exam:  BP (!) 150/84    Pulse 76    Temp 98.3 F (36.8 C) (Oral)    Wt 152 lb 9.6 oz (69.2 kg)    LMP  (LMP Unknown)    BMI 27.03 kg/m  No LMP recorded (lmp unknown). Patient is postmenopausal. Psych:  Alert and cooperative. Normal mood and affect. General:   Alert,  Well-developed, well-nourished, pleasant and cooperative in NAD Head:  Normocephalic and atraumatic. Eyes:  Sclera clear, no icterus.   Conjunctiva pink.  Neurologic:  Alert and oriented x3;  grossly normal neurologically. Psych:  Alert and cooperative. Normal mood and  affect.  Imaging Studies: No results found.  Assessment and Plan:   Melinda Preston is a 88 y.o. y/o female has been referred for Dysphagia.  She states that she had difficulty swallowing solids and liquids for a few days when she made the appointment but since then it has completely resolved and has no symptoms whatsoever.  She said that she will call our office if the symptoms recur and I said that if it were to recur we will set her up for an EGD ASAP.   Follow up in as needed  Dr Jonathon Bellows MD,MRCP(U.K)

## 2022-01-03 NOTE — Patient Instructions (Signed)

## 2022-01-08 ENCOUNTER — Encounter: Payer: Self-pay | Admitting: Nurse Practitioner

## 2022-01-08 ENCOUNTER — Other Ambulatory Visit: Payer: Self-pay

## 2022-01-08 ENCOUNTER — Ambulatory Visit (INDEPENDENT_AMBULATORY_CARE_PROVIDER_SITE_OTHER): Payer: Medicare Other | Admitting: Nurse Practitioner

## 2022-01-08 VITALS — BP 132/80 | HR 73 | Temp 98.2°F | Ht 63.0 in | Wt 155.6 lb

## 2022-01-08 DIAGNOSIS — I5022 Chronic systolic (congestive) heart failure: Secondary | ICD-10-CM | POA: Diagnosis not present

## 2022-01-08 DIAGNOSIS — F325 Major depressive disorder, single episode, in full remission: Secondary | ICD-10-CM | POA: Diagnosis not present

## 2022-01-08 DIAGNOSIS — E113293 Type 2 diabetes mellitus with mild nonproliferative diabetic retinopathy without macular edema, bilateral: Secondary | ICD-10-CM

## 2022-01-08 DIAGNOSIS — I700 Atherosclerosis of aorta: Secondary | ICD-10-CM

## 2022-01-08 DIAGNOSIS — K219 Gastro-esophageal reflux disease without esophagitis: Secondary | ICD-10-CM

## 2022-01-08 DIAGNOSIS — F5101 Primary insomnia: Secondary | ICD-10-CM | POA: Diagnosis not present

## 2022-01-08 DIAGNOSIS — E7800 Pure hypercholesterolemia, unspecified: Secondary | ICD-10-CM

## 2022-01-08 DIAGNOSIS — R7301 Impaired fasting glucose: Secondary | ICD-10-CM

## 2022-01-08 DIAGNOSIS — I10 Essential (primary) hypertension: Secondary | ICD-10-CM | POA: Diagnosis not present

## 2022-01-08 DIAGNOSIS — I482 Chronic atrial fibrillation, unspecified: Secondary | ICD-10-CM | POA: Diagnosis not present

## 2022-01-08 LAB — BAYER DCA HB A1C WAIVED: HB A1C (BAYER DCA - WAIVED): 6.3 % — ABNORMAL HIGH (ref 4.8–5.6)

## 2022-01-08 LAB — MICROALBUMIN, URINE WAIVED
Creatinine, Urine Waived: 50 mg/dL (ref 10–300)
Microalb, Ur Waived: 10 mg/L (ref 0–19)
Microalb/Creat Ratio: <30 mg/g (ref <30)

## 2022-01-08 MED ORDER — ATORVASTATIN CALCIUM 20 MG PO TABS: ORAL_TABLET | ORAL | 4 refills | Status: DC

## 2022-01-08 MED ORDER — TRAZODONE HCL 50 MG PO TABS
ORAL_TABLET | ORAL | 4 refills | Status: DC
Start: 2022-01-08 — End: 2023-01-13

## 2022-01-08 MED ORDER — DILTIAZEM HCL ER COATED BEADS 180 MG PO CP24
ORAL_CAPSULE | ORAL | 4 refills | Status: DC
Start: 2022-01-08 — End: 2022-07-08

## 2022-01-08 MED ORDER — SERTRALINE HCL 25 MG PO TABS
ORAL_TABLET | ORAL | 4 refills | Status: DC
Start: 2022-01-08 — End: 2023-01-13

## 2022-01-08 MED ORDER — POTASSIUM CHLORIDE CRYS ER 20 MEQ PO TBCR: 20.0000 meq | EXTENDED_RELEASE_TABLET | Freq: Every day | ORAL | 4 refills | Status: DC

## 2022-01-08 MED ORDER — LOSARTAN POTASSIUM 25 MG PO TABS
ORAL_TABLET | ORAL | 4 refills | Status: DC
Start: 2022-01-08 — End: 2022-07-04

## 2022-01-08 NOTE — Assessment & Plan Note (Signed)
Chronic, stable at this time.  Recent episode of difficulty swallowing, but this has improved and she is aware to return to GI as needed.  Continue Omeprazole daily.

## 2022-01-08 NOTE — Assessment & Plan Note (Signed)
Ongoing.  6.3% today.  Goal for age is A1c <8% per geriatric society, higher risk for falls due to past falls.  Avoid medication at this time and monitor levels.

## 2022-01-08 NOTE — Assessment & Plan Note (Signed)
Noted on CXR 10/01/2016.  She is to remain off ASA, continue statin daily and adjust as needed.

## 2022-01-08 NOTE — Assessment & Plan Note (Signed)
Chronic, stable with numbers at goal today for her age and at home.  Continue current medication regimen and collaboration with cardiology.  Monitor for hypotension and falls, reduce medication as needed.  CMP, CBC, TSH, urine ALB today.  Recommend she monitor BP at least 3 days a week at home and document for provider visits.  Focus on DASH diet at home.  Return in 6 months.

## 2022-01-08 NOTE — Assessment & Plan Note (Addendum)
Chronic, ongoing.  Continue current medication regimen and collaboration with cardiology. She is to remain off ASA and Eliquis indefinitely on review of past neurosurgery and cardiology notes, she is aware of this.  High fall risk and history of subdural hematoma.  Return in 6 months.

## 2022-01-08 NOTE — Assessment & Plan Note (Signed)
Chronic, stable.  Denies SI/HI.  Continue current medication regimen and adjust as needed.  Monitor NA+ level with Sertraline and advanced age.  Return in 6 months.   

## 2022-01-08 NOTE — Assessment & Plan Note (Signed)
Chronic, stable with Trazodone.  Continue current regimen and adjust as needed.  Refills sent in. 

## 2022-01-08 NOTE — Assessment & Plan Note (Signed)
Chronic, noted on past eye exam, A1c 6.3% today.  No medications, continue diet focus.

## 2022-01-08 NOTE — Assessment & Plan Note (Signed)
Chronic, ongoing.  Continue current medication regimen and adjust as needed. Lipid panel today. 

## 2022-01-08 NOTE — Assessment & Plan Note (Signed)
Chronic, ongoing.  Euvolemic today.  Continue current medication regimen and collaboration with cardiology.  - Reminded to call for an overnight weight gain of >2 pounds or a weekly weight weight of >5 pounds - not adding salt to his food and has been reading food labels. Reviewed the importance of keeping daily sodium intake to 2000mg  daily  - Avoid NSAIDs

## 2022-01-08 NOTE — Progress Notes (Signed)
BP 132/80    Pulse 73    Temp 98.2 F (36.8 C) (Oral)    Ht 5\' 3"  (1.6 m)    Wt 155 lb 9.6 oz (70.6 kg)    LMP  (LMP Unknown)    SpO2 96%    BMI 27.56 kg/m    Subjective:    Patient ID: Melinda Preston, female    DOB: [birth year], 88 y.o.   MRN: [id number]  HPI: Melinda Preston is a 88 y.o. female  Chief Complaint  Patient presents with   Congestive Heart Failure   Hypertension   Atrial Fibrillation   Insomnia   GERD Continues on Prilosec with good control. Recently saw GI due to issues with swallowing and her request to see them, but when she attended visit her swallowing had improved and no intervention needed. GERD control status: controlled Satisfied with current treatment? yes Heartburn frequency: none Medication side effects: no  Medication compliance: stable Dysphagia: none Odynophagia:  no Hematemesis: no Blood in stool: no EGD: no   HYPERTENSION / HYPERLIPIDEMIA/HF Sees cardiology, Dr. Cassie Freer.  Last visit 08/25/21. Continues on Lipitor, Losartan, Cardizem, Carvedilol, Lasix. HFrEF 35% NYHA Class II-II, ACC/AHA stage C with mild aortic stenosis on 03/17/20.    A1c on recent checks 6.3 to 6.5%.  Focuses on diet.  History of subdural hematoma in early June 2021 after a fall and has been off of Eliquis since this time. Was cleared by neurosurgery 05/16/20, with recommendations to remain off Eliquis indefinitely due to her frequent falls and risk of further falls with injury.  Cardiology has also recommended she remain off Eliquis and ASA.  No recent falls and reports her memory is doing well -- she reads often and continues to live with her son. Satisfied with current treatment? yes Duration of hypertension: chronic BP monitoring frequency: occasionally BP range: 120/80's at home on average BP medication side effects: no Duration of hyperlipidemia: chronic Cholesterol medication side effects: no Cholesterol supplements: fish oil Medication compliance:  good compliance Aspirin: yes Recent stressors: no Recurrent headaches: no Visual changes: no Palpitations: no Dyspnea: none Chest pain: no Lower extremity edema: none Dizzy/lightheaded: no    ATRIAL FIBRILLATION Atrial fibrillation status: stable Satisfied with current treatment: yes  Medication side effects:  no Medication compliance: good compliance Etiology of atrial fibrillation:  Palpitations:  no Chest pain:  no Dyspnea on exertion:  no Orthopnea:  no Syncope:  no Edema:  no Ventricular rate control: diltiazem Anti-coagulation: no longer taking due to falls  DEPRESSION Continues on Sertraline 25 MG daily and Trazodone 50 MG QHS. Mood status: stable Satisfied with current treatment?: yes Symptom severity: mild  Duration of current treatment : chronic Side effects: no Medication compliance: good compliance Depressed mood: no Anxious mood: no Anhedonia: no Significant weight loss or gain: no Insomnia: yes hard to fall asleep Fatigue: no Feelings of worthlessness or guilt: no Impaired concentration/indecisiveness: no Suicidal ideations: no Hopelessness: no Crying spells: no Depression screen [facility] 2/9 01/08/2022 07/24/2021 07/08/2021 01/06/2021 04/23/2020  Decreased Interest 0 0 0 0 0  Down, Depressed, Hopeless 0 0 0 0 0  PHQ - 2 Score 0 0 0 0 0  Altered sleeping 0 - 0 0 0  Tired, decreased energy 0 - 0 1 0  Change in appetite 0 - 0 0 0  Feeling bad or failure about yourself  0 - 0 0 0  Trouble concentrating 0 - 0 0 1  Moving slowly or fidgety/restless 0 - 0  0 1  Suicidal thoughts 0 - 0 0 0  PHQ-9 Score 0 - 0 1 2  Difficult doing work/chores Not difficult at all - Not difficult at all Not difficult at all -  Some recent data might be hidden   Relevant past medical, surgical, family and social history reviewed and updated as indicated. Interim medical history since our last visit reviewed. Allergies and medications reviewed and updated.  Review of Systems   Constitutional:  Negative for activity change, appetite change, diaphoresis, fatigue and fever.  Respiratory:  Negative for cough, chest tightness and shortness of breath.   Cardiovascular:  Negative for chest pain, palpitations and leg swelling.  Gastrointestinal: Negative.   Endocrine: Negative for cold intolerance, heat intolerance, polydipsia, polyphagia and polyuria.  Neurological: Negative.   Psychiatric/Behavioral: Negative.     Per HPI unless specifically indicated above     Objective:    BP 132/80    Pulse 73    Temp 98.2 F (36.8 C) (Oral)    Ht 5\' 3"  (1.6 m)    Wt 155 lb 9.6 oz (70.6 kg)    LMP  (LMP Unknown)    SpO2 96%    BMI 27.56 kg/m   Wt Readings from Last 3 Encounters:  01/08/22 155 lb 9.6 oz (70.6 kg)  12/24/21 152 lb 9.6 oz (69.2 kg)  07/24/21 159 lb (72.1 kg)    Physical Exam Vitals and nursing note reviewed.  Constitutional:      General: She is awake. She is not in acute distress.    Appearance: She is well-developed. She is not ill-appearing.  HENT:     Head: Normocephalic.     Right Ear: Hearing normal.     Left Ear: Hearing normal.     Nose: Nose normal.     Mouth/Throat:     Mouth: Mucous membranes are moist.  Eyes:     General: Lids are normal.        Right eye: No discharge.        Left eye: No discharge.     Conjunctiva/sclera: Conjunctivae normal.     Pupils: Pupils are equal, round, and reactive to light.  Neck:     Thyroid: No thyromegaly.     Vascular: No carotid bruit or JVD.  Cardiovascular:     Rate and Rhythm: Normal rate. Rhythm irregularly irregular.     Heart sounds: Murmur heard.  Systolic murmur is present with a grade of 2/6.    No gallop.     Comments: Systolic murmur with radiation to carotids. Pulmonary:     Effort: Pulmonary effort is normal.     Breath sounds: Normal breath sounds.  Abdominal:     General: Bowel sounds are normal.     Palpations: Abdomen is soft.  Musculoskeletal:     Cervical back: Normal range  of motion and neck supple.     Right lower leg: Edema (trace) present.     Left lower leg: Edema (trace) present.  Lymphadenopathy:     Cervical: No cervical adenopathy.  Skin:    General: Skin is warm and dry.  Neurological:     Mental Status: She is alert and oriented to person, place, and time.  Psychiatric:        Attention and Perception: Attention normal.        Mood and Affect: Mood normal.        Behavior: Behavior normal. Behavior is cooperative.        Thought Content:  Thought content normal.        Judgment: Judgment normal.   Results for orders placed or performed in visit on 01/08/22  Bayer DCA Hb A1c Waived  Result Value Ref Range   HB A1C (BAYER DCA - WAIVED) 6.3 (H) 4.8 - 5.6 %  Microalbumin, Urine Waived  Result Value Ref Range   Microalb, Ur Waived 10 0 - 19 mg/L   Creatinine, Urine Waived 50 10 - 300 mg/dL   Microalb/Creat Ratio <30 <30 mg/g      Assessment & Plan:   Problem List Items Addressed This Visit       Cardiovascular and Mediastinum   Aortic atherosclerosis ([place])    Noted on CXR 10/01/2016.  She is to remain off ASA, continue statin daily and adjust as needed.      Relevant Medications   atorvastatin (LIPITOR) 20 MG tablet   diltiazem (CARDIZEM CD) 180 MG 24 hr capsule   losartan (COZAAR) 25 MG tablet   Chronic atrial fibrillation (HCC)    Chronic, ongoing.  Continue current medication regimen and collaboration with cardiology. She is to remain off ASA and Eliquis indefinitely on review of past neurosurgery and cardiology notes, she is aware of this.  High fall risk and history of subdural hematoma.  Return in 6 months.      Relevant Medications   atorvastatin (LIPITOR) 20 MG tablet   diltiazem (CARDIZEM CD) 180 MG 24 hr capsule   losartan (COZAAR) 25 MG tablet   Other Relevant Orders   Comprehensive metabolic panel   TSH   Chronic systolic CHF (congestive heart failure) (HCC) - Primary    Chronic, ongoing.  Euvolemic today.  Continue  current medication regimen and collaboration with cardiology.  - Reminded to call for an overnight weight gain of >2 pounds or a weekly weight weight of >5 pounds - not adding salt to his food and has been reading food labels. Reviewed the importance of keeping daily sodium intake to 2000mg  daily  - Avoid NSAIDs      Relevant Medications   atorvastatin (LIPITOR) 20 MG tablet   diltiazem (CARDIZEM CD) 180 MG 24 hr capsule   losartan (COZAAR) 25 MG tablet   Other Relevant Orders   CBC with Differential/Platelet   Comprehensive metabolic panel   Lipid Panel w/o Chol/HDL Ratio   Essential hypertension    Chronic, stable with numbers at goal today for her age and at home.  Continue current medication regimen and collaboration with cardiology.  Monitor for hypotension and falls, reduce medication as needed.  CMP, CBC, TSH, urine ALB today.  Recommend she monitor BP at least 3 days a week at home and document for provider visits.  Focus on DASH diet at home.  Return in 6 months.      Relevant Medications   atorvastatin (LIPITOR) 20 MG tablet   diltiazem (CARDIZEM CD) 180 MG 24 hr capsule   losartan (COZAAR) 25 MG tablet   Other Relevant Orders   CBC with Differential/Platelet   Comprehensive metabolic panel   TSH     Digestive   GERD without esophagitis    Chronic, stable at this time.  Recent episode of difficulty swallowing, but this has improved and she is aware to return to GI as needed.  Continue Omeprazole daily.        Endocrine   Diabetic retinopathy, nonproliferative, mild (HCC)    Chronic, noted on past eye exam, A1c 6.3% today.  No medications, continue diet focus.  Relevant Medications   atorvastatin (LIPITOR) 20 MG tablet   losartan (COZAAR) 25 MG tablet   IFG (impaired fasting glucose)    Ongoing.  6.3% today.  Goal for age is A1c <8% per geriatric society, higher risk for falls due to past falls.  Avoid medication at this time and monitor levels.       Relevant Orders   Bayer DCA Hb A1c Waived (Completed)   Microalbumin, Urine Waived (Completed)     Other   Depression    Chronic, stable.  Denies SI/HI.  Continue current medication regimen and adjust as needed.  Monitor NA+ level with Sertraline and advanced age.  Return in 6 months.        Relevant Medications   sertraline (ZOLOFT) 25 MG tablet   traZODone (DESYREL) 50 MG tablet   Hypercholesterolemia    Chronic, ongoing.  Continue current medication regimen and adjust as needed.  Lipid panel today.      Relevant Medications   atorvastatin (LIPITOR) 20 MG tablet   diltiazem (CARDIZEM CD) 180 MG 24 hr capsule   losartan (COZAAR) 25 MG tablet   Other Relevant Orders   Comprehensive metabolic panel   Lipid Panel w/o Chol/HDL Ratio   Insomnia    Chronic, stable with Trazodone.  Continue current regimen and adjust as needed.  Refills sent in.        Follow up plan: Return in about 6 months (around 07/08/2022) for HTN/HLD/HF, MOOD, GERD, IFG.

## 2022-01-09 LAB — CBC WITH DIFFERENTIAL/PLATELET
Basophils Absolute: 0 10*3/uL (ref 0.0–0.2)
Basos: 1 %
EOS (ABSOLUTE): 0.1 10*3/uL (ref 0.0–0.4)
Eos: 1 %
Hematocrit: 41.5 % (ref 34.0–46.6)
Hemoglobin: 13.4 g/dL (ref 11.1–15.9)
Immature Grans (Abs): 0 10*3/uL (ref 0.0–0.1)
Immature Granulocytes: 0 %
Lymphocytes Absolute: 1.7 10*3/uL (ref 0.7–3.1)
Lymphs: 26 %
MCH: 24.5 pg — ABNORMAL LOW (ref 26.6–33.0)
MCHC: 32.3 g/dL (ref 31.5–35.7)
MCV: 76 fL — ABNORMAL LOW (ref 79–97)
Monocytes Absolute: 0.6 10*3/uL (ref 0.1–0.9)
Monocytes: 9 %
Neutrophils Absolute: 4.2 10*3/uL (ref 1.4–7.0)
Neutrophils: 63 %
Platelets: 187 10*3/uL (ref 150–450)
RBC: 5.46 x10E6/uL — ABNORMAL HIGH (ref 3.77–5.28)
RDW: 15.4 % (ref 11.7–15.4)
WBC: 6.7 10*3/uL (ref 3.4–10.8)

## 2022-01-09 LAB — COMPREHENSIVE METABOLIC PANEL
ALT: 8 IU/L (ref 0–32)
AST: 14 IU/L (ref 0–40)
Albumin/Globulin Ratio: 1.9 (ref 1.2–2.2)
Albumin: 4.4 g/dL (ref 3.5–4.6)
Alkaline Phosphatase: 72 IU/L (ref 44–121)
BUN/Creatinine Ratio: 19 (ref 12–28)
BUN: 16 mg/dL (ref 10–36)
Bilirubin Total: 0.6 mg/dL (ref 0.0–1.2)
CO2: 29 mmol/L (ref 20–29)
Calcium: 9.7 mg/dL (ref 8.7–10.3)
Chloride: 99 mmol/L (ref 96–106)
Creatinine, Ser: 0.83 mg/dL (ref 0.57–1.00)
Globulin, Total: 2.3 g/dL (ref 1.5–4.5)
Glucose: 119 mg/dL — ABNORMAL HIGH (ref 70–99)
Potassium: 3.8 mmol/L (ref 3.5–5.2)
Sodium: 141 mmol/L (ref 134–144)
Total Protein: 6.7 g/dL (ref 6.0–8.5)
eGFR: 67 mL/min/{1.73_m2} (ref >59)

## 2022-01-09 LAB — LIPID PANEL W/O CHOL/HDL RATIO
Cholesterol, Total: 169 mg/dL (ref 100–199)
HDL: 58 mg/dL (ref >39)
LDL Chol Calc (NIH): 93 mg/dL (ref 0–99)
Triglycerides: 97 mg/dL (ref 0–149)
VLDL Cholesterol Cal: 18 mg/dL (ref 5–40)

## 2022-01-09 LAB — TSH: TSH: 2.63 u[IU]/mL (ref 0.450–4.500)

## 2022-01-10 NOTE — Progress Notes (Signed)
Good morning crew, please let Melinda Preston know her labs have returned.  Overall everything remains stable, including her kidney and liver function.  We do not need to make any medication changes.  Any questions? Keep being inspiring!!  Thank you for allowing me to participate in your care.  I appreciate you. Kindest regards, [employer]

## 2022-01-25 ENCOUNTER — Other Ambulatory Visit: Payer: Self-pay

## 2022-01-25 ENCOUNTER — Emergency Department
Admission: EM | Admit: 2022-01-25 | Discharge: 2022-01-25 | Disposition: A | Discharge status: Home / Self Care | Payer: Medicare Other | Attending: Emergency Medicine | Admitting: Emergency Medicine

## 2022-01-25 ENCOUNTER — Encounter: Payer: Self-pay | Admitting: Emergency Medicine

## 2022-01-25 ENCOUNTER — Emergency Department: Payer: Medicare Other

## 2022-01-25 DIAGNOSIS — S0003XA Contusion of scalp, initial encounter: Secondary | ICD-10-CM | POA: Diagnosis not present

## 2022-01-25 DIAGNOSIS — I509 Heart failure, unspecified: Secondary | ICD-10-CM | POA: Insufficient documentation

## 2022-01-25 DIAGNOSIS — W07XXXA Fall from chair, initial encounter: Secondary | ICD-10-CM | POA: Diagnosis not present

## 2022-01-25 DIAGNOSIS — W19XXXA Unspecified fall, initial encounter: Secondary | ICD-10-CM

## 2022-01-25 DIAGNOSIS — M47812 Spondylosis without myelopathy or radiculopathy, cervical region: Secondary | ICD-10-CM | POA: Diagnosis not present

## 2022-01-25 DIAGNOSIS — R519 Headache, unspecified: Secondary | ICD-10-CM | POA: Diagnosis not present

## 2022-01-25 DIAGNOSIS — S0083XA Contusion of other part of head, initial encounter: Secondary | ICD-10-CM | POA: Insufficient documentation

## 2022-01-25 DIAGNOSIS — E119 Type 2 diabetes mellitus without complications: Secondary | ICD-10-CM | POA: Insufficient documentation

## 2022-01-25 DIAGNOSIS — M2578 Osteophyte, vertebrae: Secondary | ICD-10-CM | POA: Diagnosis not present

## 2022-01-25 DIAGNOSIS — M542 Cervicalgia: Secondary | ICD-10-CM | POA: Diagnosis not present

## 2022-01-25 DIAGNOSIS — S0993XA Unspecified injury of face, initial encounter: Secondary | ICD-10-CM | POA: Diagnosis present

## 2022-01-25 NOTE — ED Triage Notes (Signed)
Pt to ED via POV with c/o a fall on Saturday, she turned to quickly and stumbled and fell and hit the right side of her face. She had no LOC and is not on blood thinners. She has no other complaints. She does have bruising on her right eye and side of face.  ?

## 2022-01-25 NOTE — ED Provider Notes (Signed)
[facility] Provider Note  Patient Contact: 3:13 PM (approximate)   History   Fall   HPI  Melinda Preston is a 88 y.o. female who presents to the emergency department for evaluation of bruising to the right side of the face after a mechanical fall.  Patient was at the attempting to sit in a chair when her foot got caught in the rollers of the office chair she was sitting in.  Patient then fell forward striking the right side of her face and head.  No loss of consciousness.  Almost purely mechanical in nature.  She did not hit or injure any other structure.  She has had no concerning symptoms of headache, vision changes, dizziness, weakness.  She has a history of diabetes, A-fib, congestive heart failure.  She did have an injury in 2021 in May of that year which resulted in a subdural hematoma.  Patient is here primarily due to the amount of ecchymosis to the side of her face.  She does not take any anticoagulants.  Denies headache, facial pain at this time.  Again no chest pain, shortness of breath, weakness, dizziness, nausea, vomiting, diarrhea, urinary changes.     Physical Exam   Triage Vital Signs: ED Triage Vitals  Enc Vitals Group     BP 01/25/22 1407 (!) 153/89     Pulse Rate 01/25/22 1407 73     Resp 01/25/22 1407 18     Temp 01/25/22 1407 98.1 F (36.7 C)     Temp Source 01/25/22 1407 Oral     SpO2 --      Weight 01/25/22 1408 150 lb (68 kg)     Height 01/25/22 1408 5\' 3"  (1.6 m)     Head Circumference --      Peak Flow --      Pain Score 01/25/22 1408 0     Pain Loc --      Pain Edu? --      Excl. in GC? --     Most recent vital signs: Vitals:   01/25/22 1407  BP: (!) 153/89  Pulse: 73  Resp: 18  Temp: 98.1 F (36.7 C)     General: Alert and in no acute distress. Eyes:  PERRL. EOMI. Head: Periorbital and facial ecchymosis to the right side of the face.  There is no open wounds to include lacerations or abrasions.  No other  significant signs of trauma.  Patient is overall nontender to palpation through this area.  No crepitus.  No subcutaneous emphysema.  No battle signs, raccoon eyes, serosanguineous fluid drainage from the ears or nares  Neck: No stridor. No cervical spine tenderness to palpation.  Cardiovascular:  Good peripheral perfusion Respiratory: Normal respiratory effort without tachypnea or retractions. Lungs CTAB. Good air entry to the bases with no decreased or absent breath sounds. Musculoskeletal: Full range of motion to all extremities.  Neurologic:  No gross focal neurologic deficits are appreciated.  Cranial nerves II through XII grossly intact. Skin:   No rash noted Other:   ED Results / Procedures / Treatments   Labs (all labs ordered are listed, but only abnormal results are displayed) Labs Reviewed - No data to display   EKG     RADIOLOGY  I personally viewed and evaluated these images as part of my medical decision making, as well as reviewing the written report by the radiologist.  ED Provider Interpretation: No acute traumatic findings to include skull fracture or intracranial  hemorrhage.  No facial fractures identified.  CT Head Wo Contrast  Result Date: 01/25/2022 CLINICAL DATA:  Patient fell 2 days ago and has headaches and right facial pain. EXAM: CT HEAD WITHOUT CONTRAST CT MAXILLOFACIAL WITHOUT CONTRAST CT CERVICAL SPINE WITHOUT CONTRAST TECHNIQUE: Multidetector CT imaging of the head, cervical spine, and maxillofacial structures were performed using the standard protocol without intravenous contrast. Multiplanar CT image reconstructions of the cervical spine and maxillofacial structures were also generated. RADIATION DOSE REDUCTION: This exam was performed according to the departmental dose-optimization program which includes automated exposure control, adjustment of the mA and/or kV according to patient size and/or use of iterative reconstruction technique. COMPARISON:  CT  head 05/14/2020.  Cervical spine CT 04/04/2020. FINDINGS: CT HEAD FINDINGS Brain: There is no evidence of acute intracranial hemorrhage, mass lesion, brain edema or extra-axial fluid collection. The ventricles and subarachnoid spaces are appropriately sized for age. There is no CT evidence of acute cortical infarction. Mild chronic small vessel ischemic changes in the periventricular white matter, similar to previous study. Vascular: Intracranial vascular calcifications. No hyperdense vessel identified. Skull: Negative for fracture or focal lesion. Other: Right frontal scalp hematoma. CT MAXILLOFACIAL FINDINGS Osseous: No evidence of acute maxillofacial fracture. The mandible and temporomandibular joints are intact. Orbits: The globes are intact. Evidence of previous lens surgery bilaterally. No orbital hematoma. The optic nerves and extraocular muscles appear normal. Sinuses: Minimal ethmoid sinus mucosal thickening. No air-fluid levels. The mastoid air cells and middle ears are clear. Soft tissues: No focal soft tissue swelling, fluid collection or foreign body identified. Dentures are in place. CT CERVICAL SPINE FINDINGS Alignment: Normal. Skull base and vertebrae: No evidence of acute cervical spine fracture or traumatic subluxation. Soft tissues and spinal canal: No prevertebral fluid or swelling. No visible canal hematoma. Disc levels: Multilevel cervical spondylosis again noted, similar to previous study. There is loss of disc height with posterior osteophytes most advanced at C4-5, C5-6 and C6-7. Mild foraminal narrowing at multiple levels. The facet joints appear ankylosed on the left at C3-4. Upper chest: Emphysematous changes at both lung apices. Aortic and great vessel atherosclerosis. Other: None. IMPRESSION: 1. No acute intracranial or calvarial findings. Underlying mild chronic small vessel ischemic changes. 2. Right frontal scalp hematoma. 3. No evidence of acute maxillofacial fracture or significant  facial hematoma. 4. No evidence of acute cervical spine fracture, traumatic subluxation or static signs of instability. Multilevel cervical spondylosis, similar to previous CT. 5.  Aortic Atherosclerosis (ICD10-I70.0). Electronically Signed   By: Carey BullocksWilliam  Veazey M.D.   On: 01/25/2022 14:53   CT Cervical Spine Wo Contrast  Result Date: 01/25/2022 CLINICAL DATA:  Patient fell 2 days ago and has headaches and right facial pain. EXAM: CT HEAD WITHOUT CONTRAST CT MAXILLOFACIAL WITHOUT CONTRAST CT CERVICAL SPINE WITHOUT CONTRAST TECHNIQUE: Multidetector CT imaging of the head, cervical spine, and maxillofacial structures were performed using the standard protocol without intravenous contrast. Multiplanar CT image reconstructions of the cervical spine and maxillofacial structures were also generated. RADIATION DOSE REDUCTION: This exam was performed according to the departmental dose-optimization program which includes automated exposure control, adjustment of the mA and/or kV according to patient size and/or use of iterative reconstruction technique. COMPARISON:  CT head 05/14/2020.  Cervical spine CT 04/04/2020. FINDINGS: CT HEAD FINDINGS Brain: There is no evidence of acute intracranial hemorrhage, mass lesion, brain edema or extra-axial fluid collection. The ventricles and subarachnoid spaces are appropriately sized for age. There is no CT evidence of acute cortical infarction. Mild chronic small  vessel ischemic changes in the periventricular white matter, similar to previous study. Vascular: Intracranial vascular calcifications. No hyperdense vessel identified. Skull: Negative for fracture or focal lesion. Other: Right frontal scalp hematoma. CT MAXILLOFACIAL FINDINGS Osseous: No evidence of acute maxillofacial fracture. The mandible and temporomandibular joints are intact. Orbits: The globes are intact. Evidence of previous lens surgery bilaterally. No orbital hematoma. The optic nerves and extraocular muscles  appear normal. Sinuses: Minimal ethmoid sinus mucosal thickening. No air-fluid levels. The mastoid air cells and middle ears are clear. Soft tissues: No focal soft tissue swelling, fluid collection or foreign body identified. Dentures are in place. CT CERVICAL SPINE FINDINGS Alignment: Normal. Skull base and vertebrae: No evidence of acute cervical spine fracture or traumatic subluxation. Soft tissues and spinal canal: No prevertebral fluid or swelling. No visible canal hematoma. Disc levels: Multilevel cervical spondylosis again noted, similar to previous study. There is loss of disc height with posterior osteophytes most advanced at C4-5, C5-6 and C6-7. Mild foraminal narrowing at multiple levels. The facet joints appear ankylosed on the left at C3-4. Upper chest: Emphysematous changes at both lung apices. Aortic and great vessel atherosclerosis. Other: None. IMPRESSION: 1. No acute intracranial or calvarial findings. Underlying mild chronic small vessel ischemic changes. 2. Right frontal scalp hematoma. 3. No evidence of acute maxillofacial fracture or significant facial hematoma. 4. No evidence of acute cervical spine fracture, traumatic subluxation or static signs of instability. Multilevel cervical spondylosis, similar to previous CT. 5.  Aortic Atherosclerosis (ICD10-I70.0). Electronically Signed   By: Carey Bullocks M.D.   On: 01/25/2022 14:53   CT Maxillofacial Wo Contrast  Result Date: 01/25/2022 CLINICAL DATA:  Patient fell 2 days ago and has headaches and right facial pain. EXAM: CT HEAD WITHOUT CONTRAST CT MAXILLOFACIAL WITHOUT CONTRAST CT CERVICAL SPINE WITHOUT CONTRAST TECHNIQUE: Multidetector CT imaging of the head, cervical spine, and maxillofacial structures were performed using the standard protocol without intravenous contrast. Multiplanar CT image reconstructions of the cervical spine and maxillofacial structures were also generated. RADIATION DOSE REDUCTION: This exam was performed  according to the departmental dose-optimization program which includes automated exposure control, adjustment of the mA and/or kV according to patient size and/or use of iterative reconstruction technique. COMPARISON:  CT head 05/14/2020.  Cervical spine CT 04/04/2020. FINDINGS: CT HEAD FINDINGS Brain: There is no evidence of acute intracranial hemorrhage, mass lesion, brain edema or extra-axial fluid collection. The ventricles and subarachnoid spaces are appropriately sized for age. There is no CT evidence of acute cortical infarction. Mild chronic small vessel ischemic changes in the periventricular white matter, similar to previous study. Vascular: Intracranial vascular calcifications. No hyperdense vessel identified. Skull: Negative for fracture or focal lesion. Other: Right frontal scalp hematoma. CT MAXILLOFACIAL FINDINGS Osseous: No evidence of acute maxillofacial fracture. The mandible and temporomandibular joints are intact. Orbits: The globes are intact. Evidence of previous lens surgery bilaterally. No orbital hematoma. The optic nerves and extraocular muscles appear normal. Sinuses: Minimal ethmoid sinus mucosal thickening. No air-fluid levels. The mastoid air cells and middle ears are clear. Soft tissues: No focal soft tissue swelling, fluid collection or foreign body identified. Dentures are in place. CT CERVICAL SPINE FINDINGS Alignment: Normal. Skull base and vertebrae: No evidence of acute cervical spine fracture or traumatic subluxation. Soft tissues and spinal canal: No prevertebral fluid or swelling. No visible canal hematoma. Disc levels: Multilevel cervical spondylosis again noted, similar to previous study. There is loss of disc height with posterior osteophytes most advanced at C4-5, C5-6 and C6-7.  Mild foraminal narrowing at multiple levels. The facet joints appear ankylosed on the left at C3-4. Upper chest: Emphysematous changes at both lung apices. Aortic and great vessel atherosclerosis.  Other: None. IMPRESSION: 1. No acute intracranial or calvarial findings. Underlying mild chronic small vessel ischemic changes. 2. Right frontal scalp hematoma. 3. No evidence of acute maxillofacial fracture or significant facial hematoma. 4. No evidence of acute cervical spine fracture, traumatic subluxation or static signs of instability. Multilevel cervical spondylosis, similar to previous CT. 5.  Aortic Atherosclerosis (ICD10-I70.0). Electronically Signed   By: Carey Bullocks M.D.   On: 01/25/2022 14:53    PROCEDURES:  Critical Care performed: No  Procedures   MEDICATIONS ORDERED IN ED: Medications - No data to display   IMPRESSION / MDM / ASSESSMENT AND PLAN / ED COURSE  I reviewed the triage vital signs and the nursing notes.                              Differential diagnosis includes, but is not limited to, facial fracture, skull fracture, intracranial hemorrhage, facial contusion   Patient's diagnosis is consistent with fall, facial contusion.  Patient presented to the emergency department after sustaining a mechanical fall 2 days ago.  She was attempting to sit in a chair when her foot got caught in the rollers of the chair.  Patient hit the right side of her face.  She did not lose consciousness.  This was purely mechanical in nature.  She had no other symptoms other than bruising to the face and wanted this evaluated.  Imaging of the head, face, neck is reassuring.  Given her age, I considered labs, however this was a mechanical fall and she is only concerned about the bruising.  She had no other associated symptoms to be concern for for weakness, dizziness, lightheadedness, fevers, chills, URI symptoms, GI or urinary complaints.  Patient has had no repetition of fall, again states that she caught her foot which caused her to fall and this was purely mechanical in nature.  Given the option for further work-up patient prefers discharge after reassuring imaging.. Patient is given ED  precautions to return to the ED for any worsening or new symptoms.        FINAL CLINICAL IMPRESSION(S) / ED DIAGNOSES   Final diagnoses:  Fall, initial encounter  Contusion of face, initial encounter     Rx / DC Orders   ED Discharge Orders     None        Note:  This document was prepared using Dragon voice recognition software and may include unintentional dictation errors.   Racheal Patches, PA-C 01/25/22 [id number]    Concha Se, MD 01/25/22 2351

## 2022-02-01 ENCOUNTER — Telehealth: Payer: Self-pay | Admitting: *Deleted

## 2022-02-01 NOTE — Telephone Encounter (Signed)
Transition Care Management Unsuccessful Follow-up Telephone Call ? ?Date of discharge and from where:  [facility] 01-25-2022 ? ?Attempts:  1st Attempt ? ?Reason for unsuccessful TCM follow-up call:  No answer/busy ? ?  ?

## 2022-02-02 NOTE — Telephone Encounter (Signed)
Transition Care Management Unsuccessful Follow-up Telephone Call ? ?Date of discharge and from where:  [facility]  ? ?Attempts:  2nd Attempt ? ?Reason for unsuccessful TCM follow-up call:  No answer/busy ? ?  ?

## 2022-02-03 ENCOUNTER — Telehealth: Payer: Self-pay | Admitting: *Deleted

## 2022-02-03 NOTE — Telephone Encounter (Signed)
Transition Care Management Unsuccessful Follow-up Telephone Call ? ?Date of discharge and from where:  [facility] 01-25-2022 ? ?Attempts:  3rd Attempt ? ?Reason for unsuccessful TCM follow-up call:  No answer/busy ? ?  ?

## 2022-02-23 ENCOUNTER — Ambulatory Visit: Payer: Medicare Other | Admitting: Gastroenterology

## 2022-02-23 IMAGING — US US CAROTID DUPLEX BILAT
1 series · 13 of 24 positions shown · non-contrast
Comparison: None.

CLINICAL DATA: Stroke-like symptoms

EXAM:
BILATERAL CAROTID DUPLEX ULTRASOUND
TECHNIQUE: Gray scale imaging, color Doppler and duplex ultrasound were
performed of bilateral carotid and vertebral arteries in the neck.

[Series 1: us carotid bilateral · 13 of 66 slices shown]
[im 1/66]
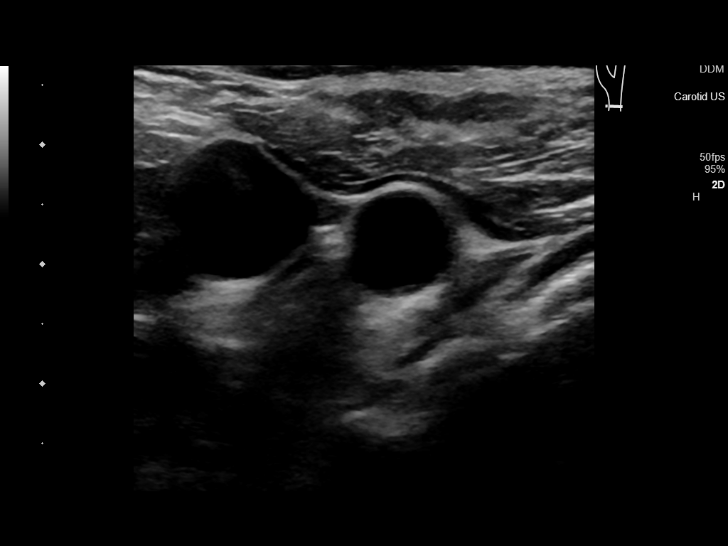
[im 6/66]
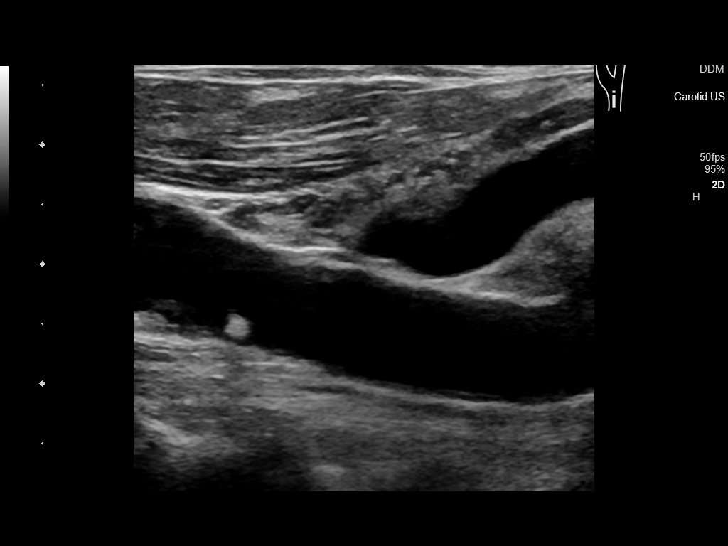
[im 12/66]
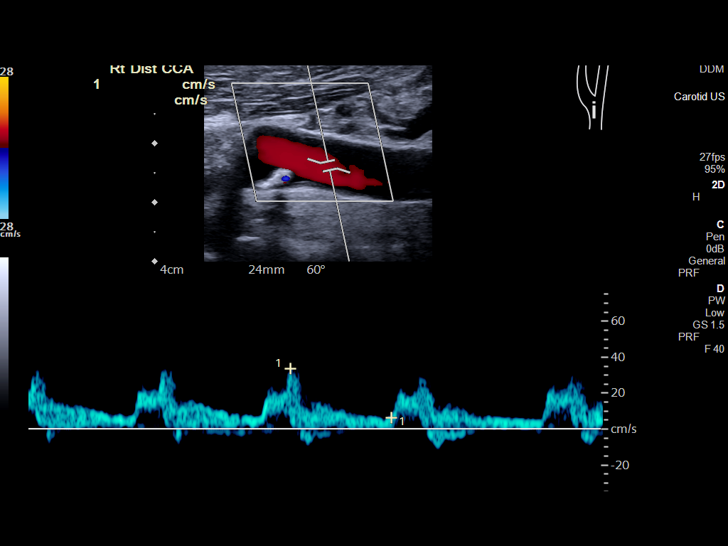
[im 17/66]
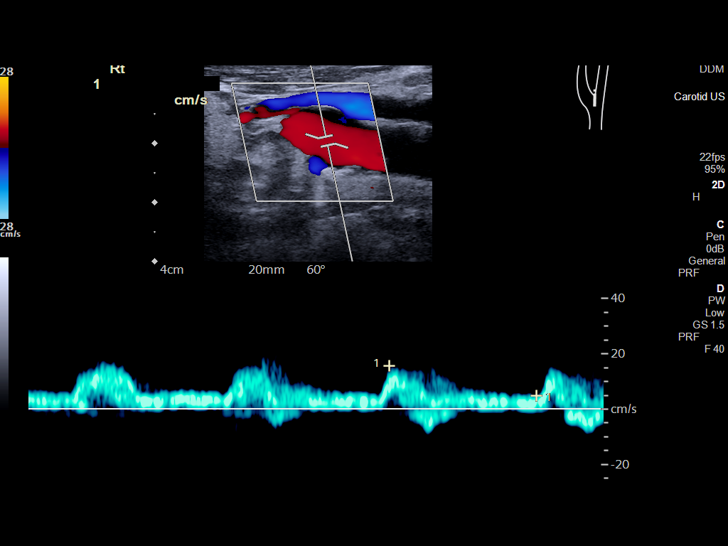
[im 23/66]
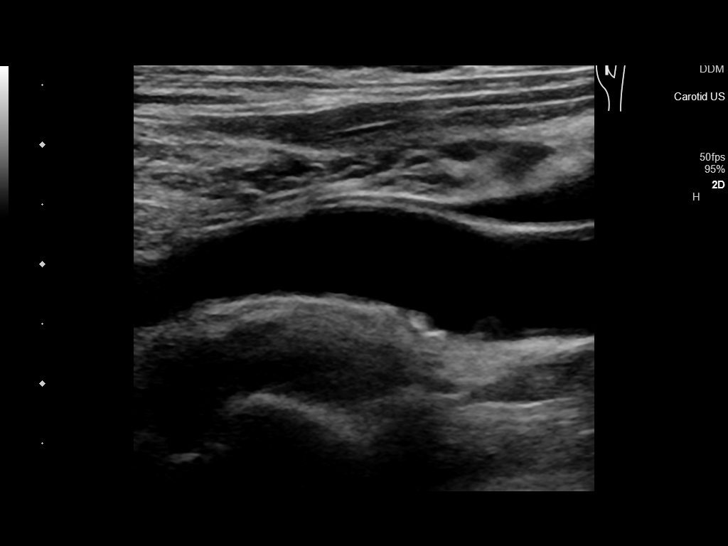
[im 29/66]
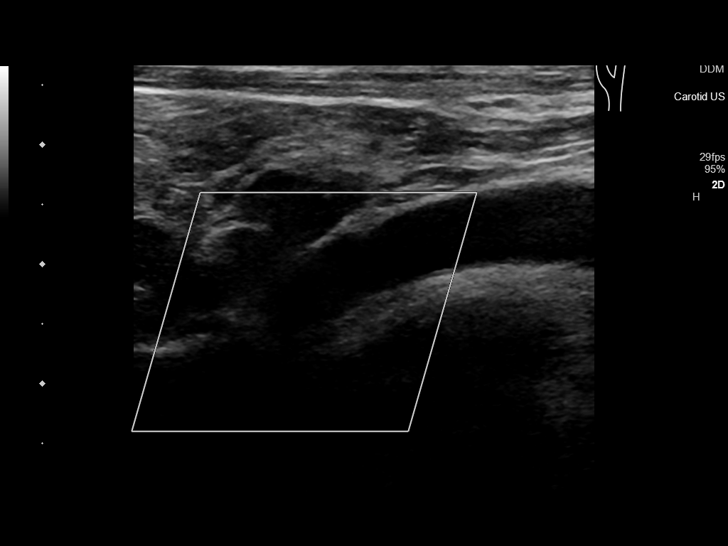
[im 34/66]
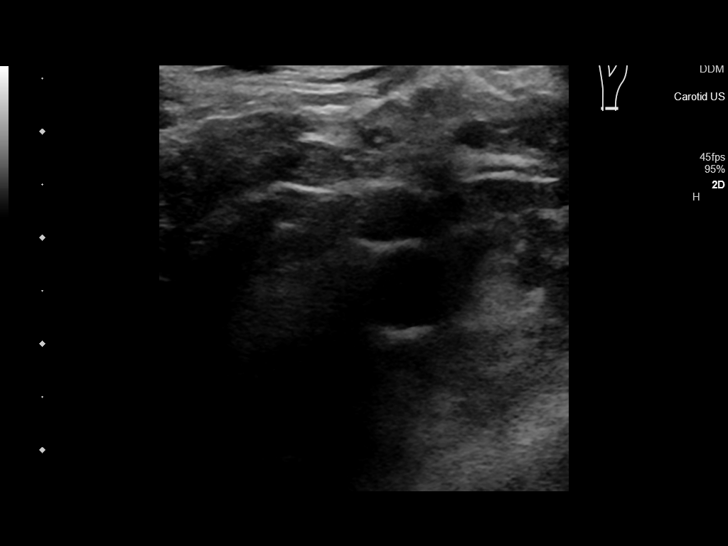
[im 37/66]
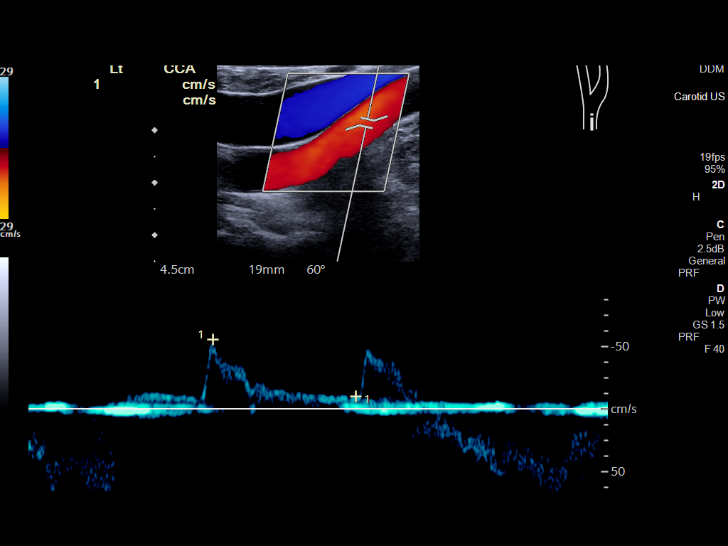
[im 43/66]
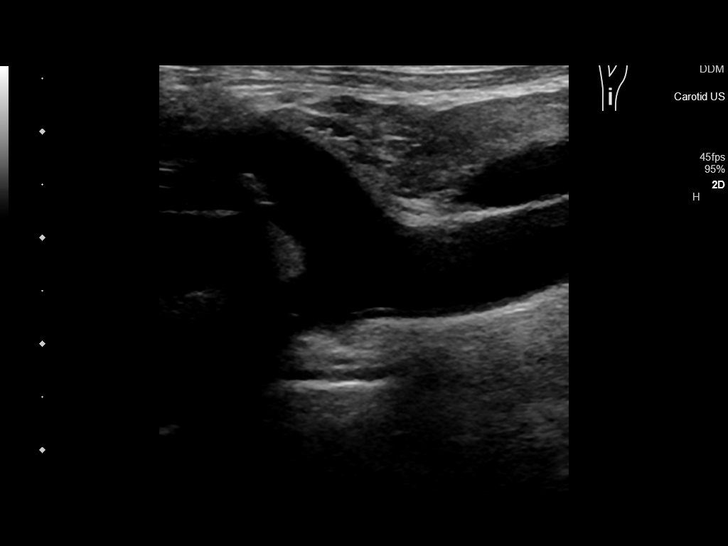
[im 49/66]
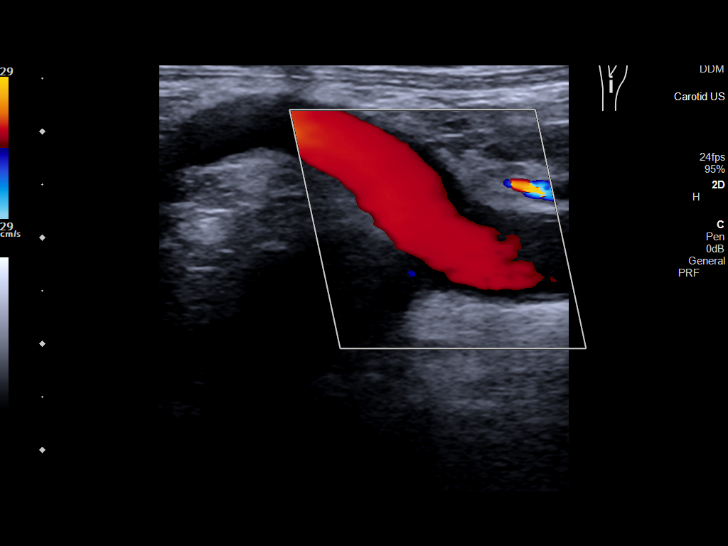
[im 54/66]
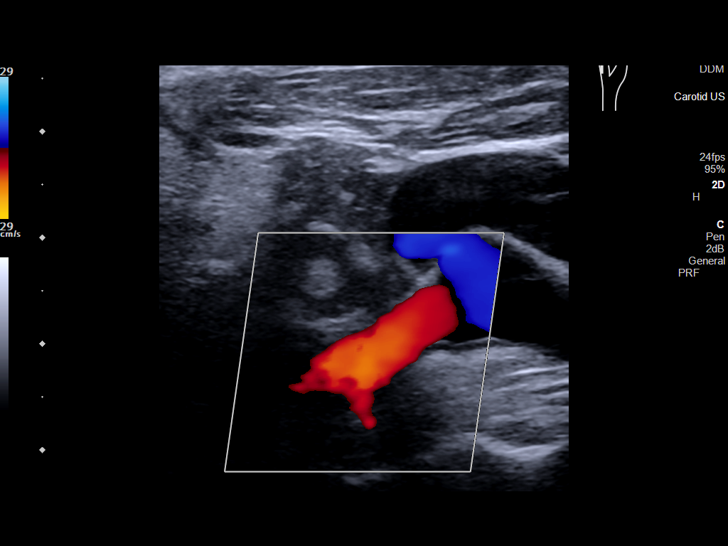
[im 60/66]
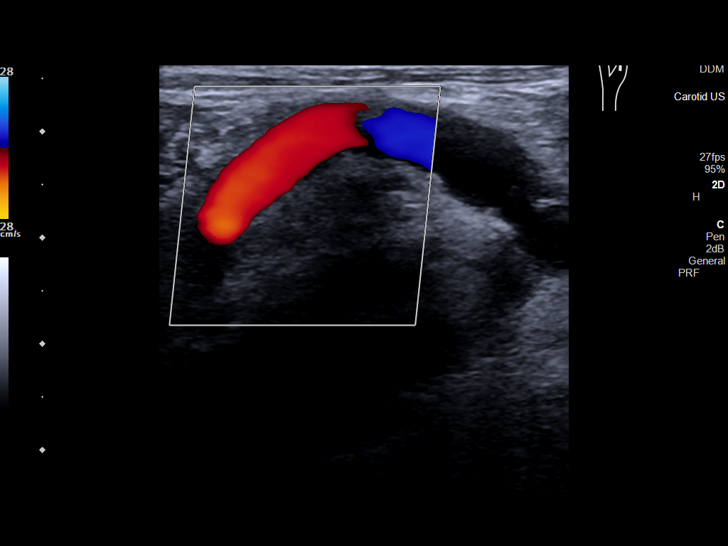
[im 66/66]
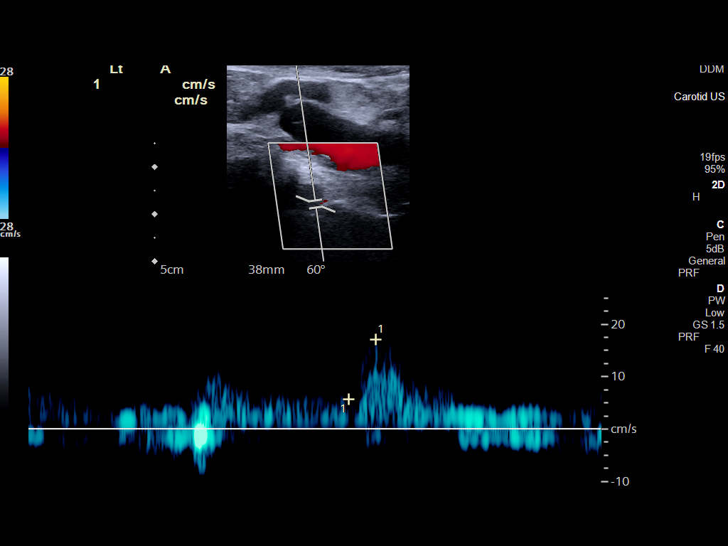

[13 of 24 positions shown; findings below may reference images not displayed]

FINDINGS: Criteria: Quantification of carotid stenosis is based on velocity
parameters that correlate the residual internal carotid diameter
with NASCET-based stenosis levels, using the diameter of the distal
internal carotid lumen as the denominator for stenosis measurement.

The following velocity measurements were obtained:

RIGHT
ICA: 38/13 cm/sec
CCA: 39/6 cm/sec

SYSTOLIC ICA/CCA RATIO:

ECA:  19 cm/sec

LEFT

ICA: 59/16 cm/sec

CCA: 21/6 cm/sec

SYSTOLIC ICA/CCA RATIO:

ECA:  38 cm/sec

RIGHT CAROTID ARTERY: No significant atherosclerotic plaque or
evidence of stenosis in the proximal internal carotid artery.

RIGHT VERTEBRAL ARTERY:  Patent with normal antegrade flow.

LEFT CAROTID ARTERY: Disuse no significant atherosclerotic plaque or
evidence of stenosis in the proximal internal carotid artery.

LEFT VERTEBRAL ARTERY:  Patent with normal antegrade flow.
IMPRESSION: 1. No significant atherosclerotic plaque or stenosis in either
internal carotid artery.
2. Minimal heterogeneous atherosclerotic plaque in the right distal
common carotid artery without stenosis.
3. Vertebral arteries are patent with antegrade flow.

## 2022-02-23 IMAGING — MR MR HEAD W/O CM
9 of 11 series · 36 of 48 positions shown · non-contrast
Comparison: Prior head CT from 04/15/2020.

CLINICAL DATA: Initial evaluation for subdural hematoma.

EXAM:
MRI HEAD WITHOUT CONTRAST
TECHNIQUE: Multiplanar, multiecho pulse sequences of the brain and surrounding
structures were obtained without intravenous contrast.

[Series 5: ax dwi_tracew · axial · 3.0mm · 0.60mm/px · z∈[-152,+1]mm · 6 of 96 slices shown]
[im 1/96]
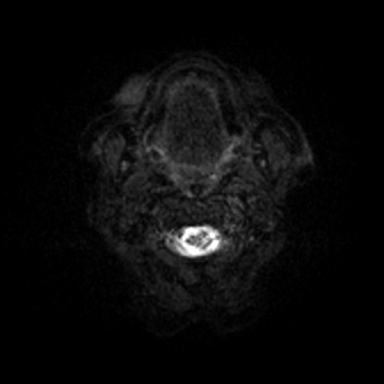
[im 20/96]
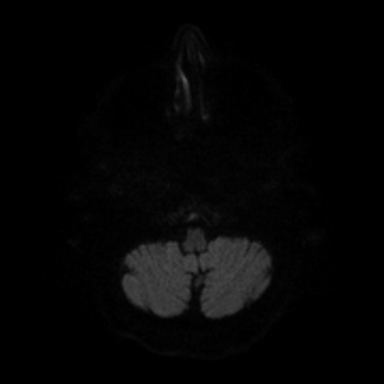
[im 39/96]
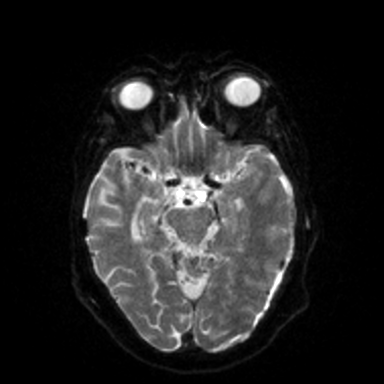
[im 58/96]
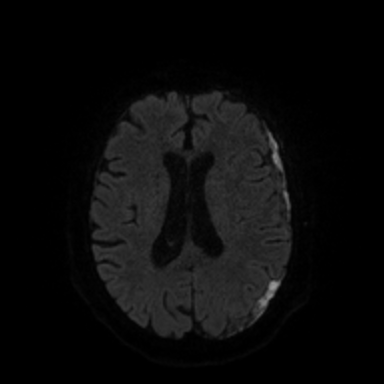
[im 77/96]
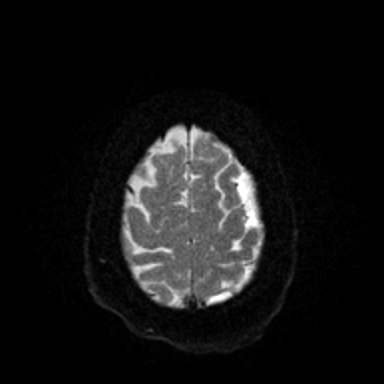
[im 96/96]
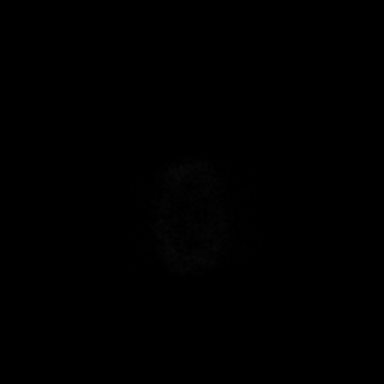

[Series 6: ax dwi_adc · axial · 3.0mm · 0.60mm/px · z∈[-152,+1]mm · 3 of 48 slices shown]
[im 1/48]
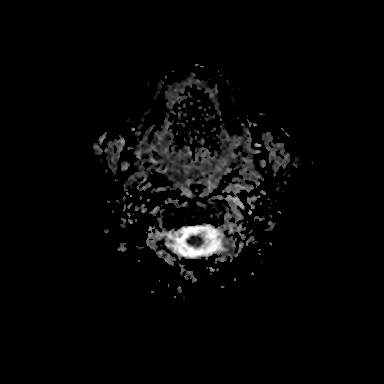
[im 24/48]
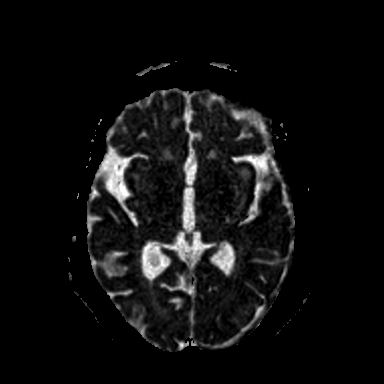
[im 48/48]
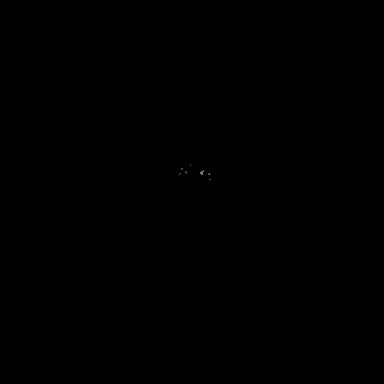

[Series 7: cor dwi_tracew · coronal · 5.0mm · 0.60mm/px · 6 of 80 slices shown]
[im 1/80]
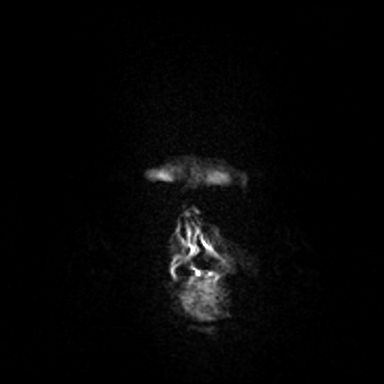
[im 16/80]
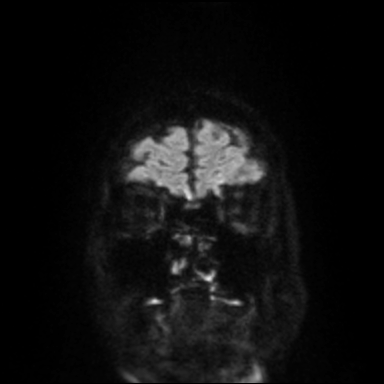
[im 32/80]
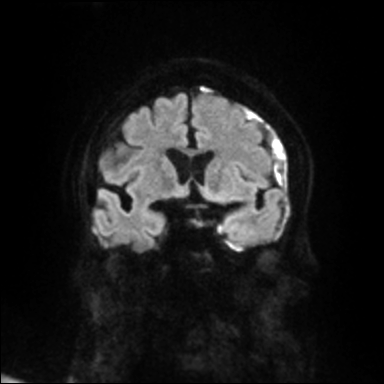
[im 48/80]
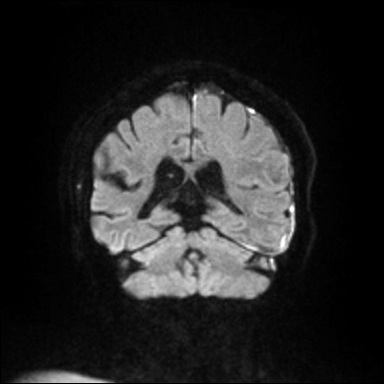
[im 64/80]
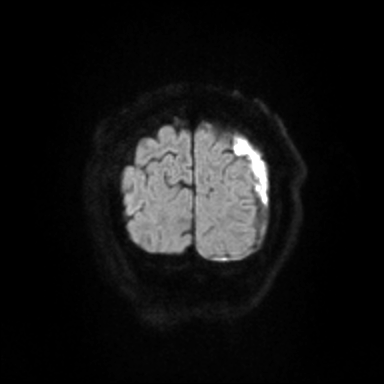
[im 80/80]
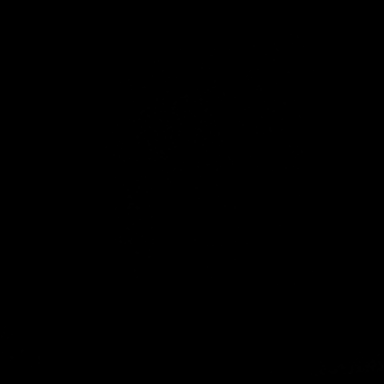

[Series 8: cor dwi_adc · coronal · 5.0mm · 0.60mm/px · 3 of 37 slices shown]
[im 1/37]
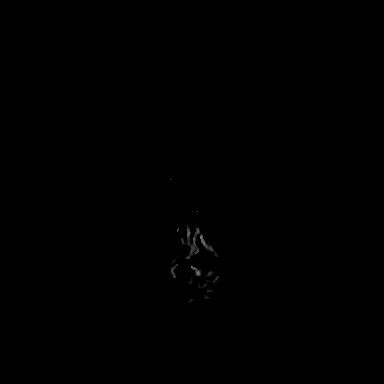
[im 19/37]
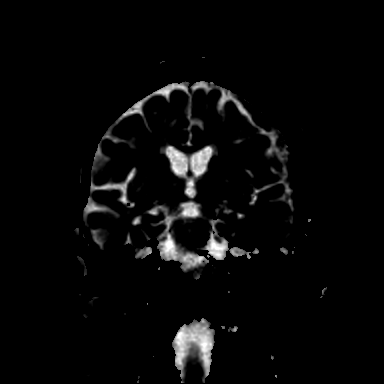
[im 37/37]
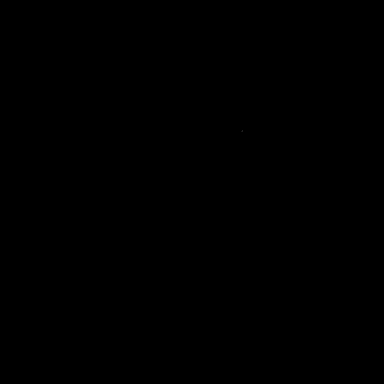

[Series 9: T1 · sagittal · 5.0mm · 0.62mm/px · 2 of 22 slices shown (1 of 2)]
[im 1/22]
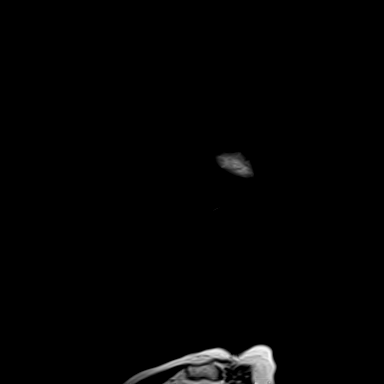
[im 22/22]
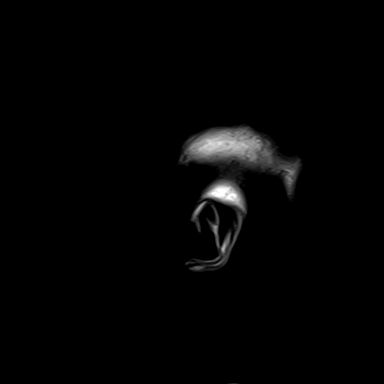

[Series 10: T2 · axial · 5.0mm · 0.53mm/px · z∈[-143,-1]mm · 2 of 25 slices shown (1 of 2)]
[im 1/25]
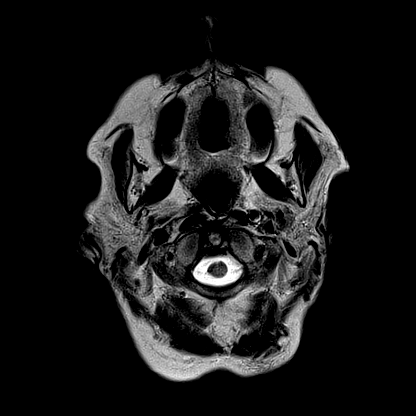
[im 25/25]
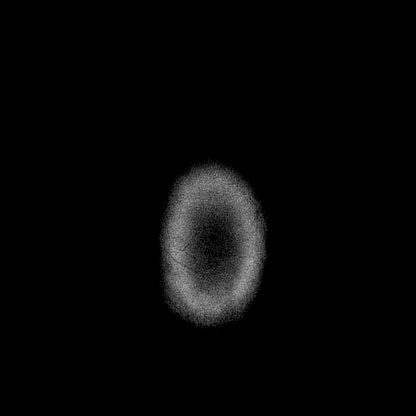

[Series 15: FLAIR · axial · 3.0mm · 0.53mm/px · z∈[-152,+8]mm · 4 of 55 slices shown]
[im 1/55]
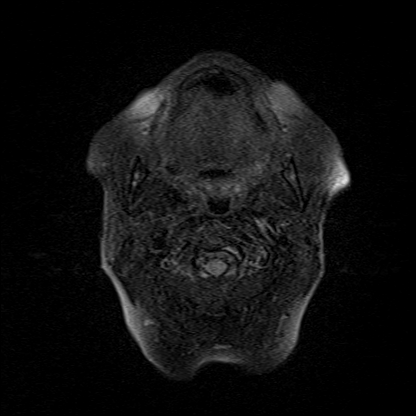
[im 19/55]
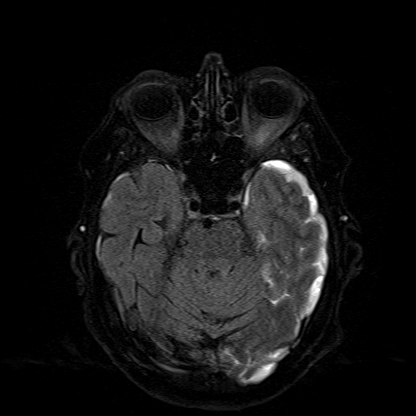
[im 37/55]
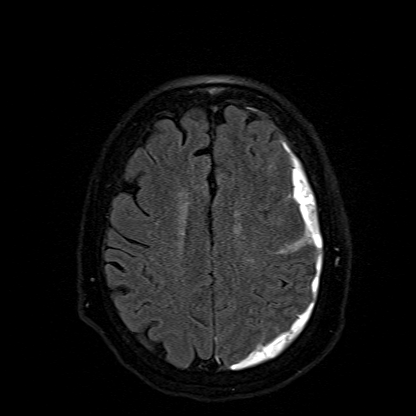
[im 55/55]
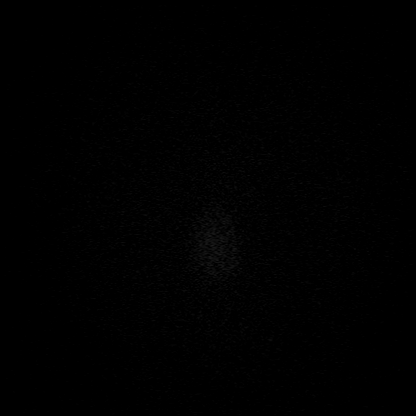

[Series 16: T1 · axial · 1.0mm · 0.98mm/px · z∈[-161,+13]mm · 8 of 176 slices shown (2 of 2)]
[im 1/176]
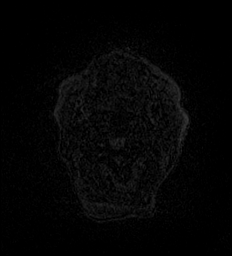
[im 32/176]
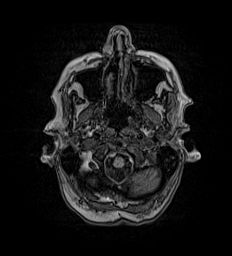
[im 48/176]
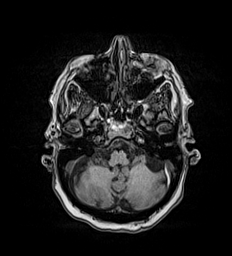
[im 80/176]
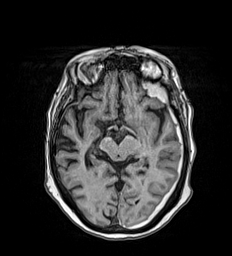
[im 96/176]
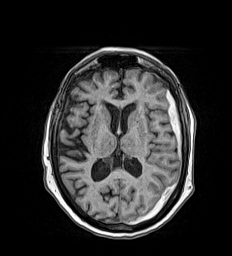
[im 128/176]
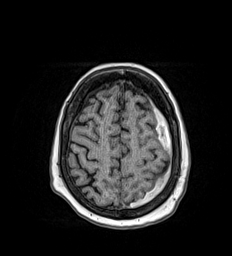
[im 144/176]
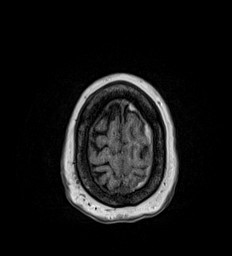
[im 176/176]
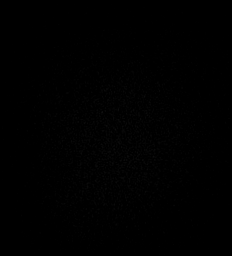

[Series 17: T2 · coronal · 5.0mm · 0.57mm/px · 2 of 29 slices shown (2 of 2)]
[im 1/29]
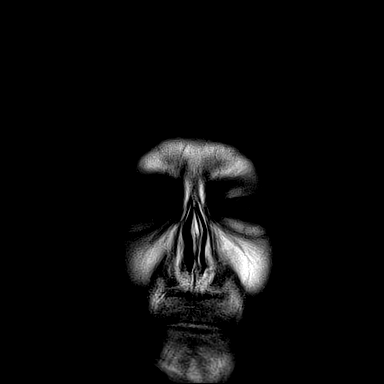
[im 29/29]
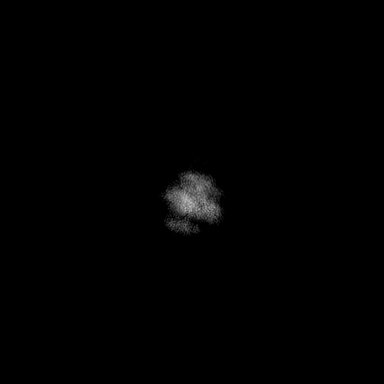

[36 of 48 positions shown; findings below may reference images not displayed]

FINDINGS: Brain: Generalized age-related cerebral atrophy. Patchy T2/FLAIR
hyperintensity within the periventricular deep white matter both
cerebral hemispheres most consistent with chronic small vessel
ischemic disease, moderate in nature.

No abnormal foci of restricted diffusion to suggest acute or
subacute ischemia. Gray-white matter differentiation maintained. No
encephalomalacia to suggest chronic cortical infarction. No acute
intracranial hemorrhage within the brain itself. Few scattered
chronic micro hemorrhages noted clustered about the splenium and
deep gray nuclei, likely related to underlying hypertension.

No mass lesion. Subacute left holo hemispheric subdural hematoma
again seen, measuring up to 8 mm in maximal thickness at the level
of the left frontal lobe. Mild mass effect on the subjacent left
cerebral hemisphere without significant midline shift. Additional
trace right-sided subdural hematoma is seen overlying the right
frontotemporal convexity, measuring no more than 2 mm in maximal
thickness without associated mass effect. This is also subacute in
appearance. No midline shift or hydrocephalus.

Pituitary gland suprasellar region normal. Midline structures
intact.

Vascular: Major intracranial vascular flow voids are maintained.

Skull and upper cervical spine: Craniocervical junction within
normal limits. Upper cervical spine normal. Bone marrow signal
intensity within normal limits. No scalp soft tissue abnormality.

Sinuses/Orbits: Patient status post bilateral ocular lens
replacement. Globes and orbital soft tissues demonstrate no acute
finding. Tiny left maxillary sinus retention cyst noted. Paranasal
sinuses otherwise largely clear. Trace left mastoid effusion noted.
Inner ear structures grossly normal.

Other: None.
IMPRESSION: 1. 8 mm subacute left holo hemispheric subdural hematoma with mild
mass effect on the subjacent left cerebral hemisphere without
significant midline shift.
2. Additional trace 2 mm subacute right frontotemporal subdural
hematoma without associated mass effect.
3. Underlying age-related cerebral atrophy with moderate chronic
small vessel ischemic disease. No other acute intracranial
abnormality.

## 2022-03-02 DIAGNOSIS — I350 Nonrheumatic aortic (valve) stenosis: Secondary | ICD-10-CM | POA: Diagnosis not present

## 2022-03-02 DIAGNOSIS — E7800 Pure hypercholesterolemia, unspecified: Secondary | ICD-10-CM | POA: Diagnosis not present

## 2022-03-02 DIAGNOSIS — I10 Essential (primary) hypertension: Secondary | ICD-10-CM | POA: Diagnosis not present

## 2022-03-02 DIAGNOSIS — I5022 Chronic systolic (congestive) heart failure: Secondary | ICD-10-CM | POA: Diagnosis not present

## 2022-03-02 DIAGNOSIS — I480 Paroxysmal atrial fibrillation: Secondary | ICD-10-CM | POA: Diagnosis not present

## 2022-03-16 DIAGNOSIS — I351 Nonrheumatic aortic (valve) insufficiency: Secondary | ICD-10-CM | POA: Insufficient documentation

## 2022-05-18 ENCOUNTER — Other Ambulatory Visit: Payer: Self-pay | Admitting: Nurse Practitioner

## 2022-05-19 NOTE — Telephone Encounter (Signed)
Has refills remaining at same pharm. Requested Prescriptions  Pending Prescriptions Disp Refills  . diltiazem (CARDIZEM CD) 180 MG 24 hr capsule [Pharmacy Med Name: DILTIAZEM CD 180MG  CAPSULES (24 HR)] 90 capsule 4    Sig: TAKE 1 CAPSULE(180 MG) BY MOUTH DAILY     Cardiovascular: Calcium Channel Blockers 3 Failed - 05/18/2022  7:19 AM      Failed - Last BP in normal range    BP Readings from Last 1 Encounters:  01/25/22 (!) 148/80         Passed - ALT in normal range and within 360 days    ALT  Date Value Ref Range Status  01/08/2022 8 0 - 32 IU/L Final         Passed - AST in normal range and within 360 days    AST  Date Value Ref Range Status  01/08/2022 14 0 - 40 IU/L Final         Passed - Cr in normal range and within 360 days    Creatinine  Date Value Ref Range Status  06/20/2012 0.75 0.60 - 1.30 mg/dL Final   Creatinine, Ser  Date Value Ref Range Status  01/08/2022 0.83 0.57 - 1.00 mg/dL Final         Passed - Last Heart Rate in normal range    Pulse Readings from Last 1 Encounters:  01/25/22 70         Passed - Valid encounter within last 6 months    Recent Outpatient Visits          4 months ago Chronic systolic CHF (congestive heart failure) (HCC)   [facility] [name], Jolene T, NP   10 months ago Chronic systolic CHF (congestive heart failure) (HCC)   [facility] [place], Jolene T, NP   1 year ago Chronic systolic CHF (congestive heart failure) (HCC)   [facility] [place], [place] T, NP   1 year ago Chronic atrial fibrillation [facility])   [facility] [place], [place] T, NP   2 years ago History of subdural hematoma 03/28/20   [facility] [place], Dobbs ferry, NP      Future Appointments            In 1 month [name], Dorie Rank, NP Dorie Rank, [facility]   In 2 months  [employer], [facility]

## 2022-07-04 NOTE — Patient Instructions (Signed)

## 2022-07-08 ENCOUNTER — Ambulatory Visit (INDEPENDENT_AMBULATORY_CARE_PROVIDER_SITE_OTHER): Payer: Medicare Other | Admitting: Nurse Practitioner

## 2022-07-08 ENCOUNTER — Encounter: Payer: Self-pay | Admitting: Nurse Practitioner

## 2022-07-08 VITALS — BP 137/83 | HR 80 | Temp 98.1°F | Ht 63.0 in | Wt 149.7 lb

## 2022-07-08 DIAGNOSIS — R7301 Impaired fasting glucose: Secondary | ICD-10-CM

## 2022-07-08 DIAGNOSIS — I5022 Chronic systolic (congestive) heart failure: Secondary | ICD-10-CM

## 2022-07-08 DIAGNOSIS — E113293 Type 2 diabetes mellitus with mild nonproliferative diabetic retinopathy without macular edema, bilateral: Secondary | ICD-10-CM

## 2022-07-08 DIAGNOSIS — I351 Nonrheumatic aortic (valve) insufficiency: Secondary | ICD-10-CM

## 2022-07-08 DIAGNOSIS — E7800 Pure hypercholesterolemia, unspecified: Secondary | ICD-10-CM

## 2022-07-08 DIAGNOSIS — I482 Chronic atrial fibrillation, unspecified: Secondary | ICD-10-CM

## 2022-07-08 DIAGNOSIS — I10 Essential (primary) hypertension: Secondary | ICD-10-CM

## 2022-07-08 DIAGNOSIS — K219 Gastro-esophageal reflux disease without esophagitis: Secondary | ICD-10-CM

## 2022-07-08 DIAGNOSIS — I700 Atherosclerosis of aorta: Secondary | ICD-10-CM

## 2022-07-08 DIAGNOSIS — F325 Major depressive disorder, single episode, in full remission: Secondary | ICD-10-CM

## 2022-07-08 LAB — BAYER DCA HB A1C WAIVED: HB A1C (BAYER DCA - WAIVED): 6.1 % — ABNORMAL HIGH (ref 4.8–5.6)

## 2022-07-08 NOTE — Assessment & Plan Note (Signed)
Chronic.  Noted on CXR 10/01/2016.  She is to remain off ASA, continue statin daily and adjust as needed. 

## 2022-07-08 NOTE — Assessment & Plan Note (Addendum)
Chronic, stable.  Continue current medication regimen and collaboration with cardiology. She is to remain off ASA and Eliquis indefinitely on review of past neurosurgery and cardiology notes, she is aware of this.  High fall risk and history of subdural hematoma.  Return in 6 months. 

## 2022-07-08 NOTE — Assessment & Plan Note (Signed)
Chronic, noted on past eye exam, A1c 6.1% today.  No medications, continue diet focus.

## 2022-07-08 NOTE — Assessment & Plan Note (Signed)
Ongoing.  6.1% today.  Goal for age is A1c <8% per geriatric society, higher risk for falls due to past falls.  Avoid medication at this time and monitor levels.

## 2022-07-08 NOTE — Assessment & Plan Note (Signed)
Chronic, stable.  Denies SI/HI.  Continue current medication regimen and adjust as needed.  Monitor NA+ level with Sertraline and advanced age.  Return in 6 months.   

## 2022-07-08 NOTE — Assessment & Plan Note (Signed)
Chronic, ongoing.  Continue current medication regimen and adjust as needed. Lipid panel today. 

## 2022-07-08 NOTE — Assessment & Plan Note (Signed)
Chronic, ongoing.  Euvolemic today.  Continue current medication regimen, recent changes made, and collaboration with cardiology.  - Reminded to call for an overnight weight gain of >2 pounds or a weekly weight weight of >5 pounds - not adding salt to his food and has been reading food labels. Reviewed the importance of keeping daily sodium intake to 2000mg  daily  - Avoid NSAIDs

## 2022-07-08 NOTE — Assessment & Plan Note (Signed)
Chronic, stable.  Continue collaboration with cardiology, recent note reviewed. 

## 2022-07-08 NOTE — Assessment & Plan Note (Signed)
Chronic, stable with BP at goal for age.  Continue current medication regimen and collaboration with cardiology.  Monitor for hypotension and falls, reduce medication as needed. Recommend she monitor BP at least 3 days a week at home and document for provider visits.  Focus on DASH diet at home.  LABS: CBC and CMP.  Return in 6 months.

## 2022-07-08 NOTE — Progress Notes (Signed)
BP 137/83   Pulse 80   Temp 98.1 F (36.7 C) (Oral)   Ht 5\' 3"  (1.6 m)   Wt 149 lb 11.2 oz (67.9 kg)   LMP  (LMP Unknown)   SpO2 97%   BMI 26.52 kg/m    Subjective:    Patient ID: , female    DOB: 07/05/1928, 88 y.o.   MRN: [id number]  HPI: Melinda Preston is a 88 y.o. female  Chief Complaint  Patient presents with   Congestive Heart Failure   Atrial Fibrillation   Hypertension   Gastroesophageal Reflux   IFG   HYPERTENSION / HYPERLIPIDEMIA/HF Followed by cardiology, Dr. 82 - with last visit 03/16/22 = they started her on Entresto and changed dose of Carvedilol 6.25 MG BID.  Continues on Lipitor, Lasix. HFrEF 35% NYHA Class II-II, ACC/AHA stage C with mild aortic stenosis on 03/17/20.  A1c on recent checks 6.3 to 6.5%.  Focuses on diet. Satisfied with current treatment? yes Duration of hypertension: chronic BP monitoring frequency: occasionally BP range: 120/80's at home on average BP medication side effects: no Duration of hyperlipidemia: chronic Cholesterol medication side effects: no Cholesterol supplements: fish oil Medication compliance: good compliance Aspirin: yes Recent stressors: no Recurrent headaches: no Visual changes: no Palpitations: no Dyspnea: no Chest pain: no Lower extremity edema: no Dizzy/lightheaded: no    ATRIAL FIBRILLATION Atrial fibrillation status: stable Satisfied with current treatment: yes  Medication side effects:  no Medication compliance: good compliance Etiology of atrial fibrillation:  Palpitations:  no Chest pain:  no Dyspnea on exertion:  no Orthopnea:  no Syncope:  no Edema:  no Ventricular rate control: Carvedilol and Entresto Anti-coagulation: no longer taking due to falls  GERD Continues on Prilosec daily GERD control status: controlled Satisfied with current treatment? yes Heartburn frequency: none Medication side effects: no  Medication compliance: stable Dysphagia:  none Odynophagia:  no Hematemesis: no Blood in stool: no EGD: no   DEPRESSION Continues on Sertraline 25 MG daily and Trazodone 50 MG at night. Mood status: stable Satisfied with current treatment?: yes Symptom severity: mild  Duration of current treatment : chronic Side effects: no Medication compliance: good compliance Depressed mood: no Anxious mood: no Anhedonia: no Significant weight loss or gain: no Insomnia: yes hard to fall asleep Fatigue: no Feelings of worthlessness or guilt: no Impaired concentration/indecisiveness: no Suicidal ideations: no Hopelessness: no Crying spells: no    07/08/2022   11:03 AM 01/08/2022   11:23 AM 07/24/2021   10:38 AM 07/08/2021    1:10 PM 01/06/2021    1:59 PM  Depression screen PHQ 2/9  Decreased Interest 0 0 0 0 0  Down, Depressed, Hopeless 0 0 0 0 0  PHQ - 2 Score 0 0 0 0 0  Altered sleeping 0 0  0 0  Tired, decreased energy 0 0  0 1  Change in appetite 0 0  0 0  Feeling bad or failure about yourself  0 0  0 0  Trouble concentrating 0 0  0 0  Moving slowly or fidgety/restless 0 0  0 0  Suicidal thoughts 0 0  0 0  PHQ-9 Score 0 0  0 1  Difficult doing work/chores Not difficult at all Not difficult at all  Not difficult at all Not difficult at all   Relevant past medical, surgical, family and social history reviewed and updated as indicated. Interim medical history since our last visit reviewed. Allergies and medications reviewed and updated.  Review of Systems  Constitutional:  Negative for activity change, appetite change, diaphoresis, fatigue and fever.  Respiratory:  Negative for cough, chest tightness and shortness of breath.   Cardiovascular:  Negative for chest pain, palpitations and leg swelling.  Gastrointestinal: Negative.   Endocrine: Negative for cold intolerance, heat intolerance, polydipsia, polyphagia and polyuria.  Neurological: Negative.   Psychiatric/Behavioral: Negative.      Per HPI unless specifically  indicated above     Objective:    BP 137/83   Pulse 80   Temp 98.1 F (36.7 C) (Oral)   Ht 5\' 3"  (1.6 m)   Wt 149 lb 11.2 oz (67.9 kg)   LMP  (LMP Unknown)   SpO2 97%   BMI 26.52 kg/m   Wt Readings from Last 3 Encounters:  07/08/22 149 lb 11.2 oz (67.9 kg)  01/25/22 150 lb (68 kg)  01/08/22 155 lb 9.6 oz (70.6 kg)    Physical Exam Vitals and nursing note reviewed.  Constitutional:      General: She is awake. She is not in acute distress.    Appearance: She is well-developed. She is not ill-appearing.  HENT:     Head: Normocephalic.     Right Ear: Hearing normal.     Left Ear: Hearing normal.     Nose: Nose normal.     Mouth/Throat:     Mouth: Mucous membranes are moist.  Eyes:     General: Lids are normal.        Right eye: No discharge.        Left eye: No discharge.     Conjunctiva/sclera: Conjunctivae normal.     Pupils: Pupils are equal, round, and reactive to light.  Neck:     Thyroid: No thyromegaly.     Vascular: No carotid bruit or JVD.  Cardiovascular:     Rate and Rhythm: Normal rate. Rhythm irregularly irregular.     Heart sounds: Murmur heard.     Systolic murmur is present with a grade of 2/6.     No gallop.     Comments: Systolic murmur with radiation to carotids. Pulmonary:     Effort: Pulmonary effort is normal.     Breath sounds: Normal breath sounds.  Abdominal:     General: Bowel sounds are normal.     Palpations: Abdomen is soft.  Musculoskeletal:     Cervical back: Normal range of motion and neck supple.     Right lower leg: Edema (trace) present.     Left lower leg: Edema (trace) present.  Lymphadenopathy:     Cervical: No cervical adenopathy.  Skin:    General: Skin is warm and dry.  Neurological:     Mental Status: She is alert and oriented to person, place, and time.  Psychiatric:        Attention and Perception: Attention normal.        Mood and Affect: Mood normal.        Behavior: Behavior normal. Behavior is cooperative.         Thought Content: Thought content normal.        Judgment: Judgment normal.    Results for orders placed or performed in visit on 07/08/22  Bayer DCA Hb A1c Waived  Result Value Ref Range   HB A1C (BAYER DCA - WAIVED) 6.1 (H) 4.8 - 5.6 %      Assessment & Plan:   Problem List Items Addressed This Visit       Cardiovascular and  Mediastinum   Aortic atherosclerosis (HCC)    Chronic.  Noted on CXR 10/01/2016.  She is to remain off ASA, continue statin daily and adjust as needed.      Relevant Medications   sacubitril-valsartan (ENTRESTO) 24-26 MG   carvedilol (COREG) 6.25 MG tablet   Other Relevant Orders   CBC with Differential/Platelet   Comprehensive metabolic panel   Lipid Panel w/o Chol/HDL Ratio   Chronic atrial fibrillation (HCC)    Chronic, stable.  Continue current medication regimen and collaboration with cardiology. She is to remain off ASA and Eliquis indefinitely on review of past neurosurgery and cardiology notes, she is aware of this.  High fall risk and history of subdural hematoma.  Return in 6 months.      Relevant Medications   sacubitril-valsartan (ENTRESTO) 24-26 MG   carvedilol (COREG) 6.25 MG tablet   Other Relevant Orders   CBC with Differential/Platelet   Comprehensive metabolic panel   Lipid Panel w/o Chol/HDL Ratio   Chronic systolic CHF (congestive heart failure) (HCC) - Primary    Chronic, ongoing.  Euvolemic today.  Continue current medication regimen, recent changes made, and collaboration with cardiology.  - Reminded to call for an overnight weight gain of >2 pounds or a weekly weight weight of >5 pounds - not adding salt to his food and has been reading food labels. Reviewed the importance of keeping daily sodium intake to 2000mg  daily  - Avoid NSAIDs      Relevant Medications   sacubitril-valsartan (ENTRESTO) 24-26 MG   carvedilol (COREG) 6.25 MG tablet   Other Relevant Orders   CBC with Differential/Platelet   Comprehensive  metabolic panel   Lipid Panel w/o Chol/HDL Ratio   Essential hypertension    Chronic, stable with BP at goal for age.  Continue current medication regimen and collaboration with cardiology.  Monitor for hypotension and falls, reduce medication as needed. Recommend she monitor BP at least 3 days a week at home and document for provider visits.  Focus on DASH diet at home.  LABS: CBC and CMP.  Return in 6 months.      Relevant Medications   sacubitril-valsartan (ENTRESTO) 24-26 MG   carvedilol (COREG) 6.25 MG tablet   Other Relevant Orders   Comprehensive metabolic panel   Nonrheumatic aortic valve insufficiency    Chronic, stable.  Continue collaboration with cardiology, recent note reviewed.      Relevant Medications   sacubitril-valsartan (ENTRESTO) 24-26 MG   carvedilol (COREG) 6.25 MG tablet   Other Relevant Orders   Comprehensive metabolic panel   Lipid Panel w/o Chol/HDL Ratio     Digestive   GERD without esophagitis    Chronic, stable at this time.  Continue Prilosec daily.  Mag level today.  Discussed the risks and benefits of long term PPI use including but not limited to bone loss, chronic kidney disease, infections, low magnesium.  Will aim to use at the lowest dose for the shortest period of time.        Relevant Orders   Magnesium     Endocrine   Diabetic retinopathy, nonproliferative, mild (HCC)    Chronic, noted on past eye exam, A1c 6.1% today.  No medications, continue diet focus.      Relevant Orders   Bayer DCA Hb A1c Waived (Completed)   IFG (impaired fasting glucose)    Ongoing.  6.1% today.  Goal for age is A1c <8% per geriatric society, higher risk for falls due to past falls.  Avoid medication at this time and monitor levels.      Relevant Orders   Bayer DCA Hb A1c Waived (Completed)     Other   Depression    Chronic, stable.  Denies SI/HI.  Continue current medication regimen and adjust as needed.  Monitor NA+ level with Sertraline and advanced  age.  Return in 6 months.        Hypercholesterolemia    Chronic, ongoing.  Continue current medication regimen and adjust as needed.  Lipid panel today.      Relevant Medications   sacubitril-valsartan (ENTRESTO) 24-26 MG   carvedilol (COREG) 6.25 MG tablet   Other Relevant Orders   Comprehensive metabolic panel   Lipid Panel w/o Chol/HDL Ratio     Follow up plan: Return in about 6 months (around 01/08/2023) for HTN/HLD, GERD, MOOD, INSoMNIA, HF, A-FIB.

## 2022-07-08 NOTE — Assessment & Plan Note (Signed)
Chronic, stable at this time.  Continue Prilosec daily.  Mag level today.  Discussed the risks and benefits of long term PPI use including but not limited to bone loss, chronic kidney disease, infections, low magnesium.  Will aim to use at the lowest dose for the shortest period of time.   

## 2022-07-09 LAB — COMPREHENSIVE METABOLIC PANEL
ALT: 7 IU/L (ref 0–32)
AST: 15 IU/L (ref 0–40)
Albumin/Globulin Ratio: 1.9 (ref 1.2–2.2)
Albumin: 4.4 g/dL (ref 3.6–4.6)
Alkaline Phosphatase: 67 IU/L (ref 44–121)
BUN/Creatinine Ratio: 18 (ref 12–28)
BUN: 15 mg/dL (ref 10–36)
Bilirubin Total: 0.7 mg/dL (ref 0.0–1.2)
CO2: 22 mmol/L (ref 20–29)
Calcium: 9.5 mg/dL (ref 8.7–10.3)
Chloride: 100 mmol/L (ref 96–106)
Creatinine, Ser: 0.82 mg/dL (ref 0.57–1.00)
Globulin, Total: 2.3 g/dL (ref 1.5–4.5)
Glucose: 110 mg/dL — ABNORMAL HIGH (ref 70–99)
Potassium: 3.8 mmol/L (ref 3.5–5.2)
Sodium: 148 mmol/L — ABNORMAL HIGH (ref 134–144)
Total Protein: 6.7 g/dL (ref 6.0–8.5)
eGFR: 67 mL/min/{1.73_m2} (ref >59)

## 2022-07-09 LAB — CBC WITH DIFFERENTIAL/PLATELET
Basophils Absolute: 0 10*3/uL (ref 0.0–0.2)
Basos: 1 %
EOS (ABSOLUTE): 0 10*3/uL (ref 0.0–0.4)
Eos: 0 %
Hematocrit: 45 % (ref 34.0–46.6)
Hemoglobin: 14.6 g/dL (ref 11.1–15.9)
Immature Grans (Abs): 0 10*3/uL (ref 0.0–0.1)
Immature Granulocytes: 0 %
Lymphocytes Absolute: 1.6 10*3/uL (ref 0.7–3.1)
Lymphs: 25 %
MCH: 25.2 pg — ABNORMAL LOW (ref 26.6–33.0)
MCHC: 32.4 g/dL (ref 31.5–35.7)
MCV: 78 fL — ABNORMAL LOW (ref 79–97)
Monocytes Absolute: 0.6 10*3/uL (ref 0.1–0.9)
Monocytes: 10 %
Neutrophils Absolute: 4.1 10*3/uL (ref 1.4–7.0)
Neutrophils: 64 %
Platelets: 176 10*3/uL (ref 150–450)
RBC: 5.79 x10E6/uL — ABNORMAL HIGH (ref 3.77–5.28)
RDW: 16 % — ABNORMAL HIGH (ref 11.7–15.4)
WBC: 6.5 10*3/uL (ref 3.4–10.8)

## 2022-07-09 LAB — LIPID PANEL W/O CHOL/HDL RATIO
Cholesterol, Total: 148 mg/dL (ref 100–199)
HDL: 46 mg/dL (ref >39)
LDL Chol Calc (NIH): 79 mg/dL (ref 0–99)
Triglycerides: 129 mg/dL (ref 0–149)
VLDL Cholesterol Cal: 23 mg/dL (ref 5–40)

## 2022-07-09 LAB — MAGNESIUM: Magnesium: 1.9 mg/dL (ref 1.6–2.3)

## 2022-07-09 NOTE — Progress Notes (Signed)
Please let Melinda Preston know her labs have returned and overall everything remains stable with exception of sodium, salt, level which was a little elevated.  I recommend cut back on salt intake a little and add a little more water to daily meals.  Any questions? Keep being inspiring!!  Thank you for allowing me to participate in your care.  I appreciate you. Kindest regards, Zachary Nole

## 2022-07-26 ENCOUNTER — Ambulatory Visit (INDEPENDENT_AMBULATORY_CARE_PROVIDER_SITE_OTHER): Payer: Medicare Other | Admitting: *Deleted

## 2022-07-26 DIAGNOSIS — Z0000 Encounter for general adult medical examination without abnormal findings: Secondary | ICD-10-CM

## 2022-07-26 NOTE — Progress Notes (Signed)
Subjective:   Melinda Preston is a 88 y.o. female who presents for Medicare Annual (Subsequent) preventive examination.  I connected with  [name] on 07/26/22 by a telephone enabled telemedicine application and verified that I am speaking with the correct person using two identifiers.   I discussed the limitations of evaluation and management by telemedicine. The patient expressed understanding and agreed to proceed.  Patient location: home  Provider location:  Tele-Health-home    Review of Systems     Cardiac Risk Factors include: advanced age (>83men, >38 women);sedentary lifestyle     Objective:    Today's Vitals   There is no height or weight on file to calculate BMI.     07/26/2022   10:38 AM 01/25/2022    2:11 PM 07/24/2021   10:36 AM 04/16/2020    8:58 PM 04/15/2020    8:03 PM 04/04/2020   10:52 AM 02/18/2020   11:27 AM  Advanced Directives  Does Patient Have a Medical Advance Directive? No No Yes Yes No No Yes  Type of Surveyor, minerals;Living will    Living will;Healthcare Power of Attorney  Does patient want to make changes to medical advance directive?    No - Patient declined     Copy of Healthcare Power of Attorney in Chart?   No - copy requested    No - copy requested  Would patient like information on creating a medical advance directive? No - Patient declined   No - Patient declined No - Patient declined      Current Medications (verified) Outpatient Encounter Medications as of 07/26/2022  Medication Sig   atorvastatin (LIPITOR) 20 MG tablet TAKE 1 TABLET(20 MG) BY MOUTH AT BEDTIME   Calcium Carb-Cholecalciferol (CALCIUM 600+D3 PO) Take 1 tablet by mouth daily.    carvedilol (COREG) 6.25 MG tablet Take 6.25 mg by mouth 2 (two) times daily.   furosemide (LASIX) 40 MG tablet Take 40 mg by mouth 2 (two) times daily.   Omega-3 Fatty Acids (FISH OIL) 1000 MG CAPS Take 1,000 mg by mouth daily.   omeprazole  (PRILOSEC) 20 MG capsule TAKE 1 CAPSULE(20 MG) BY MOUTH DAILY   potassium chloride SA (KLOR-CON M) 20 MEQ tablet Take 1 tablet (20 mEq total) by mouth daily.   sacubitril-valsartan (ENTRESTO) 24-26 MG Take 1 tablet by mouth every 12 (twelve) hours.   sertraline (ZOLOFT) 25 MG tablet TAKE 1 TABLET(25 MG) BY MOUTH DAILY   traZODone (DESYREL) 50 MG tablet TAKE 1 TABLET(50 MG) BY MOUTH AT BEDTIME   VITAMIN D, CHOLECALCIFEROL, PO Take 600 Units by mouth daily.    No facility-administered encounter medications on file as of 07/26/2022.    Allergies (verified) Lisinopril   History: Past Medical History:  Diagnosis Date   Anxiety    Aortic heart valve narrowing    Atrial fibrillation (HCC)    Diabetes mellitus without complication (HCC)    Disorder of aorta (HCC)    Hyperlipidemia    Hypertension    Insomnia    Overactive bladder    Past Surgical History:  Procedure Laterality Date   APPENDECTOMY     rotator cuff surgery Left    Family History  Problem Relation Age of Onset   Heart disease Father    Breast cancer Sister    Heart disease Brother    ADD / ADHD Daughter    Diabetes Son    Social History   Socioeconomic History  Marital status: Widowed    Spouse name: Not on file   Number of children: Not on file   Years of education: Not on file   Highest education level: Not on file  Occupational History   Not on file  Tobacco Use   Smoking status: Never   Smokeless tobacco: Never  Vaping Use   Vaping Use: Never used  Substance and Sexual Activity   Alcohol use: No   Drug use: No   Sexual activity: Not Currently  Other Topics Concern   Not on file  Social History Narrative   Not on file   Social Determinants of Health   Financial Resource Strain: Low Risk  (07/26/2022)   Overall Financial Resource Strain (CARDIA)    Difficulty of Paying Living Expenses: Not hard at all  Food Insecurity: No Food Insecurity (07/26/2022)   Hunger Vital Sign    Worried About  Running Out of Food in the Last Year: Never true    Ran Out of Food in the Last Year: Never true  Transportation Needs: No Transportation Needs (07/26/2022)   PRAPARE - Administrator, Civil Service (Medical): No    Lack of Transportation (Non-Medical): No  Physical Activity: Inactive (07/26/2022)   Exercise Vital Sign    Days of Exercise per Week: 0 days    Minutes of Exercise per Session: 0 min  Stress: No Stress Concern Present (07/26/2022)   [employer] of Occupational Health - Occupational Stress Questionnaire    Feeling of Stress : Not at all  Social Connections: Socially Isolated (07/26/2022)   Social Connection and Isolation Panel [NHANES]    Frequency of Communication with Friends and Family: Three times a week    Frequency of Social Gatherings with Friends and Family: Never    Attends Religious Services: Never    Database administrator or Organizations: No    Attends Banker Meetings: Never    Marital Status: Widowed    Tobacco Counseling Counseling given: Not Answered   Clinical Intake:  Pre-visit preparation completed: Yes  Pain : No/denies pain     Nutritional Risks: None Diabetes: No  How often do you need to have someone help you when you read instructions, pamphlets, or other written materials from your doctor or pharmacy?: 1 - Never  Diabetic?  no  Interpreter Needed?: No  Information entered by :: Remi Haggard LPN   Activities of Daily Living    07/26/2022   10:47 AM  In your present state of health, do you have any difficulty performing the following activities:  Hearing? 0  Vision? 0  Difficulty concentrating or making decisions? 0  Walking or climbing stairs? 1  Dressing or bathing? 0  Doing errands, shopping? 0  Preparing Food and eating ? N  Using the Toilet? N  In the past six months, have you accidently leaked urine? Y  Do you have problems with loss of bowel control? N  Managing your Medications? N   Managing your Finances? N  Housekeeping or managing your Housekeeping? N    Patient Care Team: Marjie Skiff, NP as PCP - General (Nurse Practitioner) Marcina Millard, MD as Consulting Physician (Cardiology) Anson [place], PA-C as Physician Assistant (Orthopedic Surgery)  Indicate any recent Medical Services you may have received from other than [facility] providers in the past year (date may be approximate).     Assessment:   This is a routine wellness examination for [facility].  Hearing/Vision screen Hearing Screening -  Comments:: No trouble hearing Vision Screening - Comments:: Up to date [facility]  Dietary issues and exercise activities discussed: Current Exercise Habits: The patient does not participate in regular exercise at present   Goals Addressed             This Visit's Progress    Patient Stated       Stay healthy       Depression Screen    07/26/2022   10:46 AM 07/08/2022   11:03 AM 01/08/2022   11:23 AM 07/24/2021   10:38 AM 07/08/2021    1:10 PM 01/06/2021    1:59 PM 04/23/2020    4:45 PM  PHQ 2/9 Scores  PHQ - 2 Score 0 0 0 0 0 0 0  PHQ- 9 Score 0 0 0  0 1 2    Fall Risk    07/26/2022   10:37 AM 07/08/2022   11:02 AM 07/24/2021   10:37 AM 07/08/2021    1:09 PM 04/23/2020    4:45 PM  Fall Risk   Falls in the past year? 0 1 1 1 1   Comment   tripped on feet    Number falls in past yr: 0 1 1 1 1   Injury with Fall? 0 0 0 0 1  Risk for fall due to :  History of fall(s) Impaired mobility;Medication side effect History of fall(s)   Follow up Falls evaluation completed;Education provided;Falls prevention discussed Falls evaluation completed Falls evaluation completed;Education provided;Falls prevention discussed Falls evaluation completed     FALL RISK PREVENTION PERTAINING TO THE HOME:  Any stairs in or around the home? No  If so, are there any without handrails? No  Home free of loose throw rugs in walkways, pet beds, electrical cords, etc? Yes   Adequate lighting in your home to reduce risk of falls? Yes   ASSISTIVE DEVICES UTILIZED TO PREVENT FALLS:  Life alert? No  Use of a cane, walker or w/c? Yes  Grab bars in the bathroom? Yes  Shower chair or bench in shower? Yes  Elevated toilet seat or a handicapped toilet? Yes   TIMED UP AND GO:  Was the test performed? No .    Cognitive Function:        07/26/2022   10:39 AM 07/24/2021   10:41 AM 07/07/2017    2:41 PM  6CIT Screen  What Year? 0 points 0 points 0 points  What month? 0 points 0 points 0 points  What time? 0 points 0 points 0 points  Count back from 20 2 points 4 points 0 points  Months in reverse 2 points 2 points 0 points  Repeat phrase 4 points 8 points 2 points  Total Score 8 points 14 points 2 points    Immunizations Immunization History  Administered Date(s) Administered   Fluad Quad(high Dose 65+) 09/11/2019, 09/02/2020, 09/22/2021   Influenza, High Dose Seasonal PF 08/29/2015, 09/17/2016, 08/09/2017, 08/25/2018   Influenza, Seasonal, Injecte, Preservative Fre 10/29/2013   Influenza,inj,Quad PF,6+ Mos 10/30/2014   Influenza-Unspecified 08/29/2015, 09/02/2020   Moderna Sars-Covid-2 Vaccination 03/10/2020, 04/07/2020, 10/30/2020, 04/27/2021   Pneumococcal Conjugate-13 04/23/2014   Pneumococcal Polysaccharide-23 03/12/2015   Tdap 01/31/2017   Zoster, Live 07/15/2006    TDAP status: Up to date  Flu Vaccine status: Up to date  Pneumococcal vaccine status: Up to date  Covid-19 vaccine status: Information provided on how to obtain vaccines.   Qualifies for Shingles Vaccine? Yes   Zostavax completed No   Shingrix Completed?: No.  Education has been provided regarding the importance of this vaccine. Patient has been advised to call insurance company to determine out of pocket expense if they have not yet received this vaccine. Advised may also receive vaccine at local pharmacy or Health Dept. Verbalized acceptance and understanding.  Screening  Tests Health Maintenance  Topic Date Due   Zoster Vaccines- Shingrix (1 of 2) Never done   COVID-19 Vaccine (5 - Moderna risk series) 06/22/2021   INFLUENZA VACCINE  06/15/2022   OPHTHALMOLOGY EXAM  07/14/2022   TETANUS/TDAP  02/01/2027   Pneumonia Vaccine 50+ Years old  Completed   DEXA SCAN  Completed   HPV VACCINES  Aged Out   FOOT EXAM  Discontinued   HEMOGLOBIN A1C  Discontinued    Health Maintenance  Health Maintenance Due  Topic Date Due   Zoster Vaccines- Shingrix (1 of 2) Never done   COVID-19 Vaccine (5 - Moderna risk series) 06/22/2021   INFLUENZA VACCINE  06/15/2022   OPHTHALMOLOGY EXAM  07/14/2022    Colorectal cancer screening: No longer required.   Mammogram status: No longer required due to age.  Bone Density 2017 completed   Lung Cancer Screening: (Low Dose CT Chest recommended if Age 9-80 years, 30 pack-year currently smoking OR have quit w/in 15years.) does not qualify.   Lung Cancer Screening Referral:   Additional Screening:  Hepatitis C Screening: does not qualify;   Vision Screening: Recommended annual ophthalmology exams for early detection of glaucoma and other disorders of the eye. Is the patient up to date with their annual eye exam?  Yes  Who is the provider or what is the name of the office in which the patient attends annual eye exams? [facility] If pt is not established with a provider, would they like to be referred to a provider to establish care? No .   Dental Screening: Recommended annual dental exams for proper oral hygiene  Community Resource Referral / Chronic Care Management: CRR required this visit?  No   CCM required this visit?  No      Plan:     I have personally reviewed and noted the following in the patient's chart:   Medical and social history Use of alcohol, tobacco or illicit drugs  Current medications and supplements including opioid prescriptions. Patient is not currently taking opioid  prescriptions. Functional ability and status Nutritional status Physical activity Advanced directives List of other physicians Hospitalizations, surgeries, and ER visits in previous 12 months Vitals Screenings to include cognitive, depression, and falls Referrals and appointments  In addition, I have reviewed and discussed with patient certain preventive protocols, quality metrics, and best practice recommendations. A written personalized care plan for preventive services as well as general preventive health recommendations were provided to patient.     Remi Haggard, LPN   5/83/[id number]   Nurse Notes:

## 2022-07-26 NOTE — Patient Instructions (Signed)
Melinda Preston , Thank you for taking time to come for your Medicare Wellness Visit. I appreciate your ongoing commitment to your health goals. Please review the following plan we discussed and let me know if I can assist you in the future.   Screening recommendations/referrals: Colonoscopy: no longer required Mammogram: no longer required Bone Density: up to date Recommended yearly ophthalmology/optometry visit for glaucoma screening and checkup Recommended yearly dental visit for hygiene and checkup  Vaccinations: Influenza vaccine: up to date Pneumococcal vaccine: up to date Tdap vaccine: up to date Shingles vaccine: Education provided    Advanced directives: Education provided  Conditions/risks identified:   Next appointment: 01-10-2022 @ 10:00  Melinda Preston   Preventive Care 65 Years and Older, Female Preventive care refers to lifestyle choices and visits with your health care provider that can promote health and wellness. What does preventive care include? A yearly physical exam. This is also called an annual well check. Dental exams once or twice a year. Routine eye exams. Ask your health care provider how often you should have your eyes checked. Personal lifestyle choices, including: Daily care of your teeth and gums. Regular physical activity. Eating a healthy diet. Avoiding tobacco and drug use. Limiting alcohol use. Practicing safe sex. Taking low-dose aspirin every day. Taking vitamin and mineral supplements as recommended by your health care provider. What happens during an annual well check? The services and screenings done by your health care provider during your annual well check will depend on your age, overall health, lifestyle risk factors, and family history of disease. Counseling  Your health care provider may ask you questions about your: Alcohol use. Tobacco use. Drug use. Emotional well-being. Home and relationship well-being. Sexual activity. Eating  habits. History of falls. Memory and ability to understand (cognition). Work and work Astronomer. Reproductive health. Screening  You may have the following tests or measurements: Height, weight, and BMI. Blood pressure. Lipid and cholesterol levels. These may be checked every 5 years, or more frequently if you are over 74 years old. Skin check. Lung cancer screening. You may have this screening every year starting at age 8 if you have a 30-pack-year history of smoking and currently smoke or have quit within the past 15 years. Fecal occult blood test (FOBT) of the stool. You may have this test every year starting at age 48. Flexible sigmoidoscopy or colonoscopy. You may have a sigmoidoscopy every 5 years or a colonoscopy every 10 years starting at age 48. Hepatitis C blood test. Hepatitis B blood test. Sexually transmitted disease (STD) testing. Diabetes screening. This is done by checking your blood sugar (glucose) after you have not eaten for a while (fasting). You may have this done every 1-3 years. Bone density scan. This is done to screen for osteoporosis. You may have this done starting at age 40. Mammogram. This may be done every 1-2 years. Talk to your health care provider about how often you should have regular mammograms. Talk with your health care provider about your test results, treatment options, and if necessary, the need for more tests. Vaccines  Your health care provider may recommend certain vaccines, such as: Influenza vaccine. This is recommended every year. Tetanus, diphtheria, and acellular pertussis (Tdap, Td) vaccine. You may need a Td booster every 10 years. Zoster vaccine. You may need this after age 45. Pneumococcal 13-valent conjugate (PCV13) vaccine. One dose is recommended after age 31. Pneumococcal polysaccharide (PPSV23) vaccine. One dose is recommended after age 54. Talk to your health care  provider about which screenings and vaccines you need and how  often you need them. This information is not intended to replace advice given to you by your health care provider. Make sure you discuss any questions you have with your health care provider. Document Released: 11/28/2015 Document Revised: 07/21/2016 Document Reviewed: 09/02/2015 Elsevier Interactive Patient Education  2017 [place] Prevention in the Home Falls can cause injuries. They can happen to people of all ages. There are many things you can do to make your home safe and to help prevent falls. What can I do on the outside of my home? Regularly fix the edges of walkways and driveways and fix any cracks. Remove anything that might make you trip as you walk through a door, such as a raised step or threshold. Trim any bushes or trees on the path to your home. Use bright outdoor lighting. Clear any walking paths of anything that might make someone trip, such as rocks or tools. Regularly check to see if handrails are loose or broken. Make sure that both sides of any steps have handrails. Any raised decks and porches should have guardrails on the edges. Have any leaves, snow, or ice cleared regularly. Use sand or salt on walking paths during winter. Clean up any spills in your garage right away. This includes oil or grease spills. What can I do in the bathroom? Use night lights. Install grab bars by the toilet and in the tub and shower. Do not use towel bars as grab bars. Use non-skid mats or decals in the tub or shower. If you need to sit down in the shower, use a plastic, non-slip stool. Keep the floor dry. Clean up any water that spills on the floor as soon as it happens. Remove soap buildup in the tub or shower regularly. Attach bath mats securely with double-sided non-slip rug tape. Do not have throw rugs and other things on the floor that can make you trip. What can I do in the bedroom? Use night lights. Make sure that you have a light by your bed that is easy to  reach. Do not use any sheets or blankets that are too big for your bed. They should not hang down onto the floor. Have a firm chair that has side arms. You can use this for support while you get dressed. Do not have throw rugs and other things on the floor that can make you trip. What can I do in the kitchen? Clean up any spills right away. Avoid walking on wet floors. Keep items that you use a lot in easy-to-reach places. If you need to reach something above you, use a strong step stool that has a grab bar. Keep electrical cords out of the way. Do not use floor polish or wax that makes floors slippery. If you must use wax, use non-skid floor wax. Do not have throw rugs and other things on the floor that can make you trip. What can I do with my stairs? Do not leave any items on the stairs. Make sure that there are handrails on both sides of the stairs and use them. Fix handrails that are broken or loose. Make sure that handrails are as long as the stairways. Check any carpeting to make sure that it is firmly attached to the stairs. Fix any carpet that is loose or worn. Avoid having throw rugs at the top or bottom of the stairs. If you do have throw rugs, attach them to the  floor with carpet tape. Make sure that you have a light switch at the top of the stairs and the bottom of the stairs. If you do not have them, ask someone to add them for you. What else can I do to help prevent falls? Wear shoes that: Do not have high heels. Have rubber bottoms. Are comfortable and fit you well. Are closed at the toe. Do not wear sandals. If you use a stepladder: Make sure that it is fully opened. Do not climb a closed stepladder. Make sure that both sides of the stepladder are locked into place. Ask someone to hold it for you, if possible. Clearly mark and make sure that you can see: Any grab bars or handrails. First and last steps. Where the edge of each step is. Use tools that help you move  around (mobility aids) if they are needed. These include: Canes. Walkers. Scooters. Crutches. Turn on the lights when you go into a dark area. Replace any light bulbs as soon as they burn out. Set up your furniture so you have a clear path. Avoid moving your furniture around. If any of your floors are uneven, fix them. If there are any pets around you, be aware of where they are. Review your medicines with your doctor. Some medicines can make you feel dizzy. This can increase your chance of falling. Ask your doctor what other things that you can do to help prevent falls. This information is not intended to replace advice given to you by your health care provider. Make sure you discuss any questions you have with your health care provider. Document Released: 08/28/2009 Document Revised: 04/08/2016 Document Reviewed: 12/06/2014 Elsevier Interactive Patient Education  2017 [employer].

## 2022-09-02 ENCOUNTER — Other Ambulatory Visit: Payer: Self-pay | Admitting: Nurse Practitioner

## 2022-09-02 NOTE — Telephone Encounter (Signed)
Unable to refill per protocol, Rx request is too soon, last refill 01/08/22 for 90 and 4 RF. Will refuse request.  Requested Prescriptions  Pending Prescriptions Disp Refills  . atorvastatin (LIPITOR) 20 MG tablet [Pharmacy Med Name: ATORVASTATIN 20MG  TABLETS] 90 tablet 4    Sig: TAKE 1 TABLET(20 MG) BY MOUTH AT BEDTIME     Cardiovascular:  Antilipid - Statins Failed - 09/02/2022 10:47 AM      Failed - Lipid Panel in normal range within the last 12 months    Cholesterol, Total  Date Value Ref Range Status  07/08/2022 148 100 - 199 mg/dL Final   Cholesterol Piccolo, Waived  Date Value Ref Range Status  12/22/2015 162 <200 mg/dL Final    Comment:                            Desirable                <200                         Borderline High      200- 239                         High                     >239    LDL Chol Calc (NIH)  Date Value Ref Range Status  07/08/2022 79 0 - 99 mg/dL Final   HDL  Date Value Ref Range Status  07/08/2022 46 >39 mg/dL Final   Triglycerides  Date Value Ref Range Status  07/08/2022 129 0 - 149 mg/dL Final   Triglycerides Piccolo,Waived  Date Value Ref Range Status  12/22/2015 137 <150 mg/dL Final    Comment:                            Normal                   <150                         Borderline High     150 - 199                         High                200 - 499                         Very High                >499          Passed - Patient is not pregnant      Passed - Valid encounter within last 12 months    Recent Outpatient Visits          1 month ago Chronic systolic CHF (congestive heart failure) (HCC)   [facility] [name], Jolene T, NP   7 months ago Chronic systolic CHF (congestive heart failure) (HCC)   [facility] [place], Jolene T, NP   1 year ago Chronic systolic CHF (congestive heart failure) (HCC)   [facility] [place], [place] T, NP   1 year ago Chronic systolic  CHF  (congestive heart failure) ([place])   [place], Henrine Screws T, NP   2 years ago Chronic atrial fibrillation ([place])   [place] M[place], Barbaraann Faster, NP      Future Appointments            In 4 months [name], Barbaraann Faster, NP [facility], [facility]           Signed Prescriptions Disp Refills   omeprazole (PRILOSEC) 20 MG capsule 90 capsule 0    Sig: TAKE 1 CAPSULE(20 MG) BY MOUTH DAILY     Gastroenterology: Proton Pump Inhibitors Passed - 09/02/2022 10:47 AM      Passed - Valid encounter within last 12 months    Recent Outpatient Visits          1 month ago Chronic systolic CHF (congestive heart failure) ([place])   [place] [name], Jolene T, NP   7 months ago Chronic systolic CHF (congestive heart failure) ([place])   [place] [name], Jolene T, NP   1 year ago Chronic systolic CHF (congestive heart failure) ([place])   [place] [name], Jolene T, NP   1 year ago Chronic systolic CHF (congestive heart failure) ([place])   [place], Jolene T, NP   2 years ago Chronic atrial fibrillation ([place])   [place], Barbaraann Faster, NP      Future Appointments            In 4 months [name], Barbaraann Faster, NP [facility], [facility]

## 2022-09-02 NOTE — Telephone Encounter (Signed)
Requested Prescriptions  Pending Prescriptions Disp Refills  . atorvastatin (LIPITOR) 20 MG tablet [Pharmacy Med Name: ATORVASTATIN 20MG  TABLETS] 90 tablet 4    Sig: TAKE 1 TABLET(20 MG) BY MOUTH AT BEDTIME     Cardiovascular:  Antilipid - Statins Failed - 09/02/2022 10:47 AM      Failed - Lipid Panel in normal range within the last 12 months    Cholesterol, Total  Date Value Ref Range Status  07/08/2022 148 100 - 199 mg/dL Final   Cholesterol Piccolo, Waived  Date Value Ref Range Status  12/22/2015 162 <200 mg/dL Final    Comment:                            Desirable                <200                         Borderline High      200- 239                         High                     >239    LDL Chol Calc (NIH)  Date Value Ref Range Status  07/08/2022 79 0 - 99 mg/dL Final   HDL  Date Value Ref Range Status  07/08/2022 46 >39 mg/dL Final   Triglycerides  Date Value Ref Range Status  07/08/2022 129 0 - 149 mg/dL Final   Triglycerides Piccolo,Waived  Date Value Ref Range Status  12/22/2015 137 <150 mg/dL Final    Comment:                            Normal                   <150                         Borderline High     150 - 199                         High                200 - 499                         Very High                >499          Passed - Patient is not pregnant      Passed - Valid encounter within last 12 months    Recent Outpatient Visits          1 month ago Chronic systolic CHF (congestive heart failure) ([place])   [place] [name], Jolene T, NP   7 months ago Chronic systolic CHF (congestive heart failure) ([place])   [place], Jolene T, NP   1 year ago Chronic systolic CHF (congestive heart failure) ([place])   [place], Jolene T, NP   1 year ago Chronic systolic CHF (congestive heart failure) ([place])   [place], [place] T, NP   2 years ago Chronic atrial  fibrillation (HCC)   [facility] [place], Dorie Rank, NP      Future Appointments            In 4 months [name], Dorie Rank, NP [facility]           . omeprazole (PRILOSEC) 20 MG capsule [Pharmacy Med Name: OMEPRAZOLE 20MG  CAPSULES] 90 capsule 0    Sig: TAKE 1 CAPSULE(20 MG) BY MOUTH DAILY     Gastroenterology: Proton Pump Inhibitors Passed - 09/02/2022 10:47 AM      Passed - Valid encounter within last 12 months    Recent Outpatient Visits          1 month ago Chronic systolic CHF (congestive heart failure) (HCC)   [facility] [name], Jolene T, NP   7 months ago Chronic systolic CHF (congestive heart failure) (HCC)   [facility] [name], Jolene T, NP   1 year ago Chronic systolic CHF (congestive heart failure) (HCC)   [facility] [name], Jolene T, NP   1 year ago Chronic systolic CHF (congestive heart failure) (HCC)   [facility] [place], Jolene T, NP   2 years ago Chronic atrial fibrillation (HCC)   [facility] [place], Dobbs ferry, NP      Future Appointments            In 4 months [name], Dorie Rank, NP [facility]

## 2022-09-06 ENCOUNTER — Ambulatory Visit (INDEPENDENT_AMBULATORY_CARE_PROVIDER_SITE_OTHER): Payer: Medicare Other

## 2022-09-06 DIAGNOSIS — Z23 Encounter for immunization: Secondary | ICD-10-CM

## 2022-11-27 ENCOUNTER — Other Ambulatory Visit: Payer: Self-pay | Admitting: Nurse Practitioner

## 2022-11-29 NOTE — Telephone Encounter (Signed)
Requested Prescriptions  Pending Prescriptions Disp Refills   omeprazole (PRILOSEC) 20 MG capsule [Pharmacy Med Name: OMEPRAZOLE 20MG  CAPSULES] 90 capsule 0    Sig: TAKE 1 CAPSULE(20 MG) BY MOUTH DAILY     Gastroenterology: Proton Pump Inhibitors Passed - 11/27/2022 11:56 AM      Passed - Valid encounter within last 12 months    Recent Outpatient Visits           4 months ago Chronic systolic CHF (congestive heart failure) ([place])   [place] [name], Jolene T, NP   10 months ago Chronic systolic CHF (congestive heart failure) ([place])   [place] [name], Jolene T, NP   1 year ago Chronic systolic CHF (congestive heart failure) ([place])   [place] [name], Jolene T, NP   1 year ago Chronic systolic CHF (congestive heart failure) ([place])   [place], Jolene T, NP   2 years ago Chronic atrial fibrillation ([place])   [place], Barbaraann Faster, NP       Future Appointments             In 1 month [name], Barbaraann Faster, NP [facility]

## 2023-01-08 DIAGNOSIS — M85852 Other specified disorders of bone density and structure, left thigh: Secondary | ICD-10-CM | POA: Insufficient documentation

## 2023-01-08 NOTE — Patient Instructions (Incomplete)
Heart Failure Action Plan A heart failure action plan helps you understand what to do when you have symptoms of heart failure. Your action plan is a color-coded plan that lists the symptoms to watch for and indicates what actions to take. If you have symptoms in the red zone, you need medical care right away. If you have symptoms in the yellow zone, you are having problems. If you have symptoms in the green zone, you are doing well. Follow the plan that was created by you and your health care provider. Review your plan each time you visit your health care provider. Red zone These signs and symptoms mean you should get medical help right away: You have trouble breathing when resting. You have a dry cough that is getting worse. You have swelling or pain in your legs or abdomen that is getting worse. You suddenly gain more than 2-3 lb (0.9-1.4 kg) in 24 hours, or more than 5 lb (2.3 kg) in a week. This amount may be more or less depending on your condition. You have trouble staying awake or you feel confused. You have chest pain. You do not have an appetite. You pass out. You have worsening sadness or depression. If you have any of these symptoms, call your local emergency services (911 in the [place] right away. Do not drive yourself to the hospital. Yellow zone These signs and symptoms mean your condition may be getting worse and you should make some changes: You have trouble breathing when you are active, or you need to sleep with your head raised on extra pillows to help you breathe. You have swelling in your legs or abdomen. You gain 2-3 lb (0.9-1.4 kg) in 24 hours, or 5 lb (2.3 kg) in a week. This amount may be more or less depending on your condition. You get tired easily. You have trouble sleeping. You have a dry cough. If you have any of these symptoms: Contact your health care provider within the next day. Your health care provider may adjust your medicines. Green zone These signs  mean you are doing well and can continue what you are doing: You do not have shortness of breath. You have very little swelling or no new swelling. Your weight is stable (no gain or loss). You have a normal activity level. You do not have chest pain or any other new symptoms. Follow these instructions at home: Take over-the-counter and prescription medicines only as told by your health care provider. Weigh yourself daily. Your target weight is __________ lb (__________ kg). Call your health care provider if you gain more than __________ lb (__________ kg) in 24 hours, or more than __________ lb (__________ kg) in a week. Health care provider name: _____________________________________________________ Health care provider phone number: _____________________________________________________ Eat a heart-healthy diet. Work with a diet and nutrition specialist (dietitian) to create an eating plan that is best for you. Keep all follow-up visits. This is important. Where to find more information American Heart Association: Summary A heart failure action plan helps you understand what to do when you have symptoms of heart failure. Follow the action plan that was created by you and your health care provider. Get help right away if you have any symptoms in the red zone. This information is not intended to replace advice given to you by your health care provider. Make sure you discuss any questions you have with your health care provider. Document Revised: 02/09/2022 Document Reviewed: 06/16/2020 Elsevier Patient Education  [place].

## 2023-01-10 ENCOUNTER — Ambulatory Visit: Payer: Medicare Other | Admitting: Nurse Practitioner

## 2023-01-10 DIAGNOSIS — I482 Chronic atrial fibrillation, unspecified: Secondary | ICD-10-CM

## 2023-01-10 DIAGNOSIS — M85852 Other specified disorders of bone density and structure, left thigh: Secondary | ICD-10-CM

## 2023-01-10 DIAGNOSIS — F325 Major depressive disorder, single episode, in full remission: Secondary | ICD-10-CM

## 2023-01-10 DIAGNOSIS — I5022 Chronic systolic (congestive) heart failure: Secondary | ICD-10-CM

## 2023-01-10 DIAGNOSIS — I351 Nonrheumatic aortic (valve) insufficiency: Secondary | ICD-10-CM

## 2023-01-10 DIAGNOSIS — F5101 Primary insomnia: Secondary | ICD-10-CM

## 2023-01-10 DIAGNOSIS — I10 Essential (primary) hypertension: Secondary | ICD-10-CM

## 2023-01-10 DIAGNOSIS — E7800 Pure hypercholesterolemia, unspecified: Secondary | ICD-10-CM

## 2023-01-10 DIAGNOSIS — I700 Atherosclerosis of aorta: Secondary | ICD-10-CM

## 2023-01-10 DIAGNOSIS — R7301 Impaired fasting glucose: Secondary | ICD-10-CM

## 2023-01-13 ENCOUNTER — Encounter: Payer: Self-pay | Admitting: Nurse Practitioner

## 2023-01-13 ENCOUNTER — Ambulatory Visit (INDEPENDENT_AMBULATORY_CARE_PROVIDER_SITE_OTHER): Payer: Medicare Other | Admitting: Nurse Practitioner

## 2023-01-13 VITALS — BP 124/77 | HR 74 | Temp 97.7°F | Ht 62.99 in | Wt 145.1 lb

## 2023-01-13 DIAGNOSIS — Z8679 Personal history of other diseases of the circulatory system: Secondary | ICD-10-CM

## 2023-01-13 DIAGNOSIS — F325 Major depressive disorder, single episode, in full remission: Secondary | ICD-10-CM

## 2023-01-13 DIAGNOSIS — I482 Chronic atrial fibrillation, unspecified: Secondary | ICD-10-CM

## 2023-01-13 DIAGNOSIS — I5022 Chronic systolic (congestive) heart failure: Secondary | ICD-10-CM

## 2023-01-13 DIAGNOSIS — M85852 Other specified disorders of bone density and structure, left thigh: Secondary | ICD-10-CM | POA: Diagnosis not present

## 2023-01-13 DIAGNOSIS — E7800 Pure hypercholesterolemia, unspecified: Secondary | ICD-10-CM | POA: Diagnosis not present

## 2023-01-13 DIAGNOSIS — I700 Atherosclerosis of aorta: Secondary | ICD-10-CM

## 2023-01-13 DIAGNOSIS — R7301 Impaired fasting glucose: Secondary | ICD-10-CM | POA: Diagnosis not present

## 2023-01-13 DIAGNOSIS — I10 Essential (primary) hypertension: Secondary | ICD-10-CM

## 2023-01-13 DIAGNOSIS — F5101 Primary insomnia: Secondary | ICD-10-CM

## 2023-01-13 DIAGNOSIS — I351 Nonrheumatic aortic (valve) insufficiency: Secondary | ICD-10-CM

## 2023-01-13 DIAGNOSIS — K219 Gastro-esophageal reflux disease without esophagitis: Secondary | ICD-10-CM

## 2023-01-13 LAB — MICROALBUMIN, URINE WAIVED
Creatinine, Urine Waived: 100 mg/dL (ref 10–300)
Microalb, Ur Waived: 80 mg/L — ABNORMAL HIGH (ref 0–19)

## 2023-01-13 LAB — BAYER DCA HB A1C WAIVED: HB A1C (BAYER DCA - WAIVED): 6.1 % — ABNORMAL HIGH (ref 4.8–5.6)

## 2023-01-13 MED ORDER — ATORVASTATIN CALCIUM 20 MG PO TABS: ORAL_TABLET | ORAL | 4 refills | Status: DC

## 2023-01-13 MED ORDER — POTASSIUM CHLORIDE CRYS ER 20 MEQ PO TBCR: 20.0000 meq | EXTENDED_RELEASE_TABLET | Freq: Every day | ORAL | 4 refills | Status: DC

## 2023-01-13 MED ORDER — OMEPRAZOLE 20 MG PO CPDR: DELAYED_RELEASE_CAPSULE | ORAL | 4 refills | Status: DC

## 2023-01-13 MED ORDER — TRAZODONE HCL 50 MG PO TABS: ORAL_TABLET | ORAL | 4 refills | Status: DC

## 2023-01-13 MED ORDER — SERTRALINE HCL 25 MG PO TABS: ORAL_TABLET | ORAL | 4 refills | Status: DC

## 2023-01-13 NOTE — Assessment & Plan Note (Signed)
Chronic, stable with Trazodone.  Continue current regimen and adjust as needed.  Refills sent in.

## 2023-01-13 NOTE — Assessment & Plan Note (Signed)
No further falls.  She is to continue off Eliquis and ASA on review of cardiology and neurosurgery notes, she is aware of this.  Continue to closely monitor.

## 2023-01-13 NOTE — Progress Notes (Signed)
BP 124/77 (BP Location: Left Arm, Patient Position: Sitting, Cuff Size: Normal)   Pulse 74   Temp 97.7 F (36.5 C) (Oral)   Ht 5' 2.99" (1.6 m)   Wt 145 lb 1.6 oz (65.8 kg)   LMP  (LMP Unknown)   SpO2 97%   BMI 25.71 kg/m    Subjective:    Patient ID: Melinda Preston, female    DOB: 01/04/1928, 89 y.o.   MRN: [id number]  HPI: Melinda Preston is a 89 y.o. female  Chief Complaint  Patient presents with   Congestive Heart Failure   Atrial Fibrillation   HYPERTENSION / HYPERLIPIDEMIA/HF Follows with cardiology, Dr. Josefa Half - last visit 10/13/22 = continues on Entresto 24-26 MG and Carvedilol 6.25 MG BID + taking Lasix and Lipitor + K + daily. HFrEF 35% NYHA Class II-II, ACC/AHA stage C with mild aortic stenosis on 03/17/22.    A1c on recent checks 6.1 to 6.5% range with recent 6.1%.  Focuses on diet. Satisfied with current treatment? yes Duration of hypertension: chronic BP monitoring frequency: occasional checks BP range: 120/80's at home on average BP medication side effects: no Duration of hyperlipidemia: chronic Cholesterol medication side effects: no Cholesterol supplements: fish oil Medication compliance: good compliance Aspirin: yes Recent stressors: no Recurrent headaches: no Visual changes: no Palpitations: occasional Dyspnea: no Chest pain: no Lower extremity edema: no Dizzy/lightheaded: no    ATRIAL FIBRILLATION Atrial fibrillation status: stable Satisfied with current treatment: yes  Medication side effects:  no Medication compliance: good compliance Etiology of atrial fibrillation:  Palpitations:  no Chest pain:  no Dyspnea on exertion:  no Orthopnea:  no Syncope:  no Edema:  no Ventricular rate control: Carvedilol and Entresto Anti-coagulation: no longer taking due to falls and history of subdural hematoma  GERD Continues on Prilosec daily GERD control status: controlled Satisfied with current treatment? yes Heartburn  frequency: none Medication side effects: no  Medication compliance: stable Dysphagia: no Odynophagia:  no Hematemesis: no Blood in stool: no EGD: no   OSTEOPENIA Noted on past DEXA, refuses to repeat. Satisfied with current treatment?: yes Adequate calcium & vitamin D: yes Weight bearing exercises: yes   DEPRESSION Continues on Sertraline 25 MG daily and Trazodone 50 MG at night. Mood status: stable Satisfied with current treatment?: yes Symptom severity: mild  Duration of current treatment : chronic Side effects: no Medication compliance: good compliance Depressed mood: no Anxious mood: no Anhedonia: no Significant weight loss or gain: no Insomnia: yes hard to fall asleep Fatigue: no Feelings of worthlessness or guilt: no Impaired concentration/indecisiveness: no Suicidal ideations: no Hopelessness: no Crying spells: no    01/13/2023   10:52 AM 07/26/2022   10:46 AM 07/08/2022   11:03 AM 01/08/2022   11:23 AM 07/24/2021   10:38 AM  Depression screen PHQ 2/9  Decreased Interest 0 0 0 0 0  Down, Depressed, Hopeless 0 0 0 0 0  PHQ - 2 Score 0 0 0 0 0  Altered sleeping 0 0 0 0   Tired, decreased energy 0 0 0 0   Change in appetite 0 0 0 0   Feeling bad or failure about yourself  0 0 0 0   Trouble concentrating 0 0 0 0   Moving slowly or fidgety/restless 0 0 0 0   Suicidal thoughts 0 0 0 0   PHQ-9 Score 0 0 0 0   Difficult doing work/chores Not difficult at all Not difficult at all Not difficult at all  Not difficult at all    Relevant past medical, surgical, family and social history reviewed and updated as indicated. Interim medical history since our last visit reviewed. Allergies and medications reviewed and updated.  Review of Systems  Constitutional:  Negative for activity change, appetite change, diaphoresis, fatigue and fever.  Respiratory:  Negative for cough, chest tightness and shortness of breath.   Cardiovascular:  Negative for chest pain, palpitations  and leg swelling.  Gastrointestinal: Negative.   Endocrine: Negative for cold intolerance, heat intolerance, polydipsia, polyphagia and polyuria.  Neurological: Negative.   Psychiatric/Behavioral: Negative.      Per HPI unless specifically indicated above     Objective:    BP 124/77 (BP Location: Left Arm, Patient Position: Sitting, Cuff Size: Normal)   Pulse 74   Temp 97.7 F (36.5 C) (Oral)   Ht 5' 2.99" (1.6 m)   Wt 145 lb 1.6 oz (65.8 kg)   LMP  (LMP Unknown)   SpO2 97%   BMI 25.71 kg/m   Wt Readings from Last 3 Encounters:  01/13/23 145 lb 1.6 oz (65.8 kg)  07/08/22 149 lb 11.2 oz (67.9 kg)  01/25/22 150 lb (68 kg)    Physical Exam Vitals and nursing note reviewed.  Constitutional:      General: She is awake. She is not in acute distress.    Appearance: She is well-developed. She is not ill-appearing.  HENT:     Head: Normocephalic.     Right Ear: Hearing normal.     Left Ear: Hearing normal.     Nose: Nose normal.     Mouth/Throat:     Mouth: Mucous membranes are moist.  Eyes:     General: Lids are normal.        Right eye: No discharge.        Left eye: No discharge.     Conjunctiva/sclera: Conjunctivae normal.     Pupils: Pupils are equal, round, and reactive to light.  Neck:     Thyroid: No thyromegaly.     Vascular: No carotid bruit or JVD.  Cardiovascular:     Rate and Rhythm: Normal rate. Rhythm irregularly irregular.     Heart sounds: Murmur heard.     Systolic murmur is present with a grade of 2/6.     No gallop.     Comments: Systolic murmur with radiation to carotids. Pulmonary:     Effort: Pulmonary effort is normal.     Breath sounds: Normal breath sounds.  Abdominal:     General: Bowel sounds are normal.     Palpations: Abdomen is soft.  Musculoskeletal:     Cervical back: Normal range of motion and neck supple.     Right lower leg: Edema (trace) present.     Left lower leg: Edema (trace) present.  Lymphadenopathy:     Cervical: No  cervical adenopathy.  Skin:    General: Skin is warm and dry.  Neurological:     Mental Status: She is alert and oriented to person, place, and time.  Psychiatric:        Attention and Perception: Attention normal.        Mood and Affect: Mood normal.        Behavior: Behavior normal. Behavior is cooperative.        Thought Content: Thought content normal.        Judgment: Judgment normal.    Results for orders placed or performed in visit on 07/08/22  Bayer Jackson Hb  A1c Waived  Result Value Ref Range   HB A1C (BAYER DCA - WAIVED) 6.1 (H) 4.8 - 5.6 %  CBC with Differential/Platelet  Result Value Ref Range   WBC 6.5 3.4 - 10.8 x10E3/uL   RBC 5.79 (H) 3.77 - 5.28 x10E6/uL   Hemoglobin 14.6 11.1 - 15.9 g/dL   Hematocrit 45.0 34.0 - 46.6 %   MCV 78 (L) 79 - 97 fL   MCH 25.2 (L) 26.6 - 33.0 pg   MCHC 32.4 31.5 - 35.7 g/dL   RDW 16.0 (H) 11.7 - 15.4 %   Platelets 176 150 - 450 x10E3/uL   Neutrophils 64 Not Estab. %   Lymphs 25 Not Estab. %   Monocytes 10 Not Estab. %   Eos 0 Not Estab. %   Basos 1 Not Estab. %   Neutrophils Absolute 4.1 1.4 - 7.0 x10E3/uL   Lymphocytes Absolute 1.6 0.7 - 3.1 x10E3/uL   Monocytes Absolute 0.6 0.1 - 0.9 x10E3/uL   EOS (ABSOLUTE) 0.0 0.0 - 0.4 x10E3/uL   Basophils Absolute 0.0 0.0 - 0.2 x10E3/uL   Immature Granulocytes 0 Not Estab. %   Immature Grans (Abs) 0.0 0.0 - 0.1 x10E3/uL  Comprehensive metabolic panel  Result Value Ref Range   Glucose 110 (H) 70 - 99 mg/dL   BUN 15 10 - 36 mg/dL   Creatinine, Ser 0.82 0.57 - 1.00 mg/dL   eGFR 67 >59 mL/min/1.73   BUN/Creatinine Ratio 18 12 - 28   Sodium 148 (H) 134 - 144 mmol/L   Potassium 3.8 3.5 - 5.2 mmol/L   Chloride 100 96 - 106 mmol/L   CO2 22 20 - 29 mmol/L   Calcium 9.5 8.7 - 10.3 mg/dL   Total Protein 6.7 6.0 - 8.5 g/dL   Albumin 4.4 3.6 - 4.6 g/dL   Globulin, Total 2.3 1.5 - 4.5 g/dL   Albumin/Globulin Ratio 1.9 1.2 - 2.2   Bilirubin Total 0.7 0.0 - 1.2 mg/dL   Alkaline Phosphatase 67  44 - 121 IU/L   AST 15 0 - 40 IU/L   ALT 7 0 - 32 IU/L  Lipid Panel w/o Chol/HDL Ratio  Result Value Ref Range   Cholesterol, Total 148 100 - 199 mg/dL   Triglycerides 129 0 - 149 mg/dL   HDL 46 >39 mg/dL   VLDL Cholesterol Cal 23 5 - 40 mg/dL   LDL Chol Calc (NIH) 79 0 - 99 mg/dL  Magnesium  Result Value Ref Range   Magnesium 1.9 1.6 - 2.3 mg/dL      Assessment & Plan:   Problem List Items Addressed This Visit       Cardiovascular and Mediastinum   Aortic atherosclerosis (HCC)    Chronic.  Noted on CXR 10/01/2016.  She is to remain off ASA, continue statin daily and adjust as needed.      Relevant Medications   atorvastatin (LIPITOR) 20 MG tablet   Other Relevant Orders   Comprehensive metabolic panel   Lipid Panel w/o Chol/HDL Ratio   Chronic atrial fibrillation (HCC)    Chronic, stable.  Continue current medication regimen and collaboration with cardiology. She is to remain off ASA and Eliquis indefinitely on review of past neurosurgery and cardiology notes, she is aware of this.  High fall risk and history of subdural hematoma.  Return in 6 months.      Relevant Medications   atorvastatin (LIPITOR) 20 MG tablet   Other Relevant Orders   Microalbumin, Urine Waived   CBC  with Differential/Platelet   Comprehensive metabolic panel   Chronic systolic CHF (congestive heart failure) (HCC) - Primary    Chronic, ongoing.  Euvolemic today.  Continue current medication regimen, recent changes made, and collaboration with cardiology.  - Reminded to call for an overnight weight gain of >2 pounds or a weekly weight weight of >5 pounds - not adding salt to his food and has been reading food labels. Reviewed the importance of keeping daily sodium intake to '2000mg'$  daily  - Avoid NSAIDs  Will monitor weight closely, has lost 5 lbs since March 2023, suspect fluid loss.      Relevant Medications   atorvastatin (LIPITOR) 20 MG tablet   Other Relevant Orders   Microalbumin, Urine  Waived   CBC with Differential/Platelet   Comprehensive metabolic panel   Essential hypertension    Chronic, stable.  BP at goal for age on recheck today and at goal on home checks.  Continue current medication regimen and collaboration with cardiology.  Monitor for hypotension and falls, reduce medication as needed. Recommend she monitor BP at least 3 days a week at home and document for provider visits.  Focus on DASH diet at home.  LABS: CBC, TSH, urine ALB, and CMP.  Return in 6 months.      Relevant Medications   atorvastatin (LIPITOR) 20 MG tablet   Other Relevant Orders   CBC with Differential/Platelet   Comprehensive metabolic panel   TSH   Nonrheumatic aortic valve insufficiency    Chronic, stable.  Continue collaboration with cardiology, recent note reviewed.      Relevant Medications   atorvastatin (LIPITOR) 20 MG tablet     Digestive   GERD without esophagitis    Chronic, stable at this time.  Continue Prilosec daily.  Mag level today.  Discussed the risks and benefits of long term PPI use including but not limited to bone loss, chronic kidney disease, infections, low magnesium.  Will aim to use at the lowest dose for the shortest period of time.        Relevant Medications   omeprazole (PRILOSEC) 20 MG capsule   Other Relevant Orders   Magnesium     Endocrine   IFG (impaired fasting glucose)    Ongoing.  6.1% last check.  Goal for age is A1c <8% per geriatric society, higher risk for falls due to past falls.  Avoid medication at this time and monitor levels.  Recheck today with urine ALB.      Relevant Orders   Bayer DCA Hb A1c Waived   Microalbumin, Urine Waived     Musculoskeletal and Integument   Osteopenia of neck of left femur    Chronic, ongoing.  Noted on past DEXA.  She does not want to repeat.  Recommend Vitamin D supplement at home daily 2000 units.      Relevant Orders   VITAMIN D 25 Hydroxy (Vit-D Deficiency, Fractures)     Other   Depression     Chronic, stable.  Denies SI/HI.  Continue current medication regimen and adjust as needed.  Monitor NA+ level with Sertraline and advanced age.  Return in 6 months.        Relevant Medications   sertraline (ZOLOFT) 25 MG tablet   traZODone (DESYREL) 50 MG tablet   History of subdural hematoma 03/28/20    No further falls.  She is to continue off Eliquis and ASA on review of cardiology and neurosurgery notes, she is aware of this.  Continue to  closely monitor.      Hypercholesterolemia    Chronic, ongoing.  Continue current medication regimen and adjust as needed.  Lipid panel today.      Relevant Medications   atorvastatin (LIPITOR) 20 MG tablet   Other Relevant Orders   Comprehensive metabolic panel   Lipid Panel w/o Chol/HDL Ratio   Insomnia    Chronic, stable with Trazodone.  Continue current regimen and adjust as needed.  Refills sent in.        Follow up plan: Return in about 6 months (around 07/14/2023) for HTN/HLD/HF, MOOD, GERD - weight check.

## 2023-01-13 NOTE — Assessment & Plan Note (Signed)
Chronic.  Noted on CXR 10/01/2016.  She is to remain off ASA, continue statin daily and adjust as needed.

## 2023-01-13 NOTE — Assessment & Plan Note (Signed)
Chronic, stable at this time.  Continue Prilosec daily.  Mag level today.  Discussed the risks and benefits of long term PPI use including but not limited to bone loss, chronic kidney disease, infections, low magnesium.  Will aim to use at the lowest dose for the shortest period of time.

## 2023-01-13 NOTE — Assessment & Plan Note (Signed)
Chronic, stable.  Denies SI/HI.  Continue current medication regimen and adjust as needed.  Monitor NA+ level with Sertraline and advanced age.  Return in 6 months.

## 2023-01-13 NOTE — Assessment & Plan Note (Signed)
Chronic, stable.  Continue collaboration with cardiology, recent note reviewed.

## 2023-01-13 NOTE — Assessment & Plan Note (Signed)
Chronic, ongoing.  Noted on past DEXA.  She does not want to repeat.  Recommend Vitamin D supplement at home daily 2000 units.

## 2023-01-13 NOTE — Assessment & Plan Note (Signed)
Chronic, stable.  BP at goal for age on recheck today and at goal on home checks.  Continue current medication regimen and collaboration with cardiology.  Monitor for hypotension and falls, reduce medication as needed. Recommend she monitor BP at least 3 days a week at home and document for provider visits.  Focus on DASH diet at home.  LABS: CBC, TSH, urine ALB, and CMP.  Return in 6 months.

## 2023-01-13 NOTE — Assessment & Plan Note (Signed)
Ongoing.  6.1% last check.  Goal for age is A1c <8% per geriatric society, higher risk for falls due to past falls.  Avoid medication at this time and monitor levels.  Recheck today with urine ALB.

## 2023-01-13 NOTE — Assessment & Plan Note (Signed)
Chronic, ongoing. Continue current medication regimen and adjust as needed.  Lipid panel today. 

## 2023-01-13 NOTE — Patient Instructions (Signed)
Heart Failure Action Plan A heart failure action plan helps you understand what to do when you have symptoms of heart failure. Your action plan is a color-coded plan that lists the symptoms to watch for and indicates what actions to take. If you have symptoms in the red zone, you need medical care right away. If you have symptoms in the yellow zone, you are having problems. If you have symptoms in the green zone, you are doing well. Follow the plan that was created by you and your health care provider. Review your plan each time you visit your health care provider. Red zone These signs and symptoms mean you should get medical help right away: You have trouble breathing when resting. You have a dry cough that is getting worse. You have swelling or pain in your legs or abdomen that is getting worse. You suddenly gain more than 2-3 lb (0.9-1.4 kg) in 24 hours, or more than 5 lb (2.3 kg) in a week. This amount may be more or less depending on your condition. You have trouble staying awake or you feel confused. You have chest pain. You do not have an appetite. You pass out. You have worsening sadness or depression. If you have any of these symptoms, call your local emergency services (911 in the [place] right away. Do not drive yourself to the hospital. Yellow zone These signs and symptoms mean your condition may be getting worse and you should make some changes: You have trouble breathing when you are active, or you need to sleep with your head raised on extra pillows to help you breathe. You have swelling in your legs or abdomen. You gain 2-3 lb (0.9-1.4 kg) in 24 hours, or 5 lb (2.3 kg) in a week. This amount may be more or less depending on your condition. You get tired easily. You have trouble sleeping. You have a dry cough. If you have any of these symptoms: Contact your health care provider within the next day. Your health care provider may adjust your medicines. Green zone These signs  mean you are doing well and can continue what you are doing: You do not have shortness of breath. You have very little swelling or no new swelling. Your weight is stable (no gain or loss). You have a normal activity level. You do not have chest pain or any other new symptoms. Follow these instructions at home: Take over-the-counter and prescription medicines only as told by your health care provider. Weigh yourself daily. Your target weight is __________ lb (__________ kg). Call your health care provider if you gain more than __________ lb (__________ kg) in 24 hours, or more than __________ lb (__________ kg) in a week. Health care provider name: _____________________________________________________ Health care provider phone number: _____________________________________________________ Eat a heart-healthy diet. Work with a diet and nutrition specialist (dietitian) to create an eating plan that is best for you. Keep all follow-up visits. This is important. Where to find more information American Heart Association: Summary A heart failure action plan helps you understand what to do when you have symptoms of heart failure. Follow the action plan that was created by you and your health care provider. Get help right away if you have any symptoms in the red zone. This information is not intended to replace advice given to you by your health care provider. Make sure you discuss any questions you have with your health care provider. Document Revised: 02/09/2022 Document Reviewed: 06/16/2020 Elsevier Patient Education  [place].

## 2023-01-13 NOTE — Assessment & Plan Note (Signed)
Chronic, stable.  Continue current medication regimen and collaboration with cardiology. She is to remain off ASA and Eliquis indefinitely on review of past neurosurgery and cardiology notes, she is aware of this.  High fall risk and history of subdural hematoma.  Return in 6 months.

## 2023-01-13 NOTE — Assessment & Plan Note (Addendum)
Chronic, ongoing.  Euvolemic today.  Continue current medication regimen, recent changes made, and collaboration with cardiology.  - Reminded to call for an overnight weight gain of >2 pounds or a weekly weight weight of >5 pounds - not adding salt to his food and has been reading food labels. Reviewed the importance of keeping daily sodium intake to '2000mg'$  daily  - Avoid NSAIDs  Will monitor weight closely, has lost 5 lbs since March 2023, suspect fluid loss.

## 2023-01-14 LAB — LIPID PANEL W/O CHOL/HDL RATIO
Cholesterol, Total: 169 mg/dL (ref 100–199)
HDL: 46 mg/dL (ref >39)
LDL Chol Calc (NIH): 101 mg/dL — ABNORMAL HIGH (ref 0–99)
Triglycerides: 124 mg/dL (ref 0–149)
VLDL Cholesterol Cal: 22 mg/dL (ref 5–40)

## 2023-01-14 LAB — COMPREHENSIVE METABOLIC PANEL
ALT: 8 IU/L (ref 0–32)
AST: 13 IU/L (ref 0–40)
Albumin/Globulin Ratio: 2 (ref 1.2–2.2)
Albumin: 4.3 g/dL (ref 3.6–4.6)
Alkaline Phosphatase: 76 IU/L (ref 44–121)
BUN/Creatinine Ratio: 16 (ref 12–28)
BUN: 14 mg/dL (ref 10–36)
Bilirubin Total: 0.5 mg/dL (ref 0.0–1.2)
CO2: 30 mmol/L — ABNORMAL HIGH (ref 20–29)
Calcium: 9.4 mg/dL (ref 8.7–10.3)
Chloride: 100 mmol/L (ref 96–106)
Creatinine, Ser: 0.89 mg/dL (ref 0.57–1.00)
Globulin, Total: 2.2 g/dL (ref 1.5–4.5)
Glucose: 117 mg/dL — ABNORMAL HIGH (ref 70–99)
Potassium: 4.5 mmol/L (ref 3.5–5.2)
Sodium: 143 mmol/L (ref 134–144)
Total Protein: 6.5 g/dL (ref 6.0–8.5)
eGFR: 61 mL/min/{1.73_m2} (ref >59)

## 2023-01-14 LAB — CBC WITH DIFFERENTIAL/PLATELET
Basophils Absolute: 0 10*3/uL (ref 0.0–0.2)
Basos: 0 %
EOS (ABSOLUTE): 0.1 10*3/uL (ref 0.0–0.4)
Eos: 1 %
Hematocrit: 46.3 % (ref 34.0–46.6)
Hemoglobin: 14.8 g/dL (ref 11.1–15.9)
Immature Grans (Abs): 0 10*3/uL (ref 0.0–0.1)
Immature Granulocytes: 0 %
Lymphocytes Absolute: 1.6 10*3/uL (ref 0.7–3.1)
Lymphs: 28 %
MCH: 25.6 pg — ABNORMAL LOW (ref 26.6–33.0)
MCHC: 32 g/dL (ref 31.5–35.7)
MCV: 80 fL (ref 79–97)
Monocytes Absolute: 0.5 10*3/uL (ref 0.1–0.9)
Monocytes: 9 %
Neutrophils Absolute: 3.7 10*3/uL (ref 1.4–7.0)
Neutrophils: 62 %
Platelets: 190 10*3/uL (ref 150–450)
RBC: 5.79 x10E6/uL — ABNORMAL HIGH (ref 3.77–5.28)
RDW: 13.9 % (ref 11.7–15.4)
WBC: 5.9 10*3/uL (ref 3.4–10.8)

## 2023-01-14 LAB — VITAMIN D 25 HYDROXY (VIT D DEFICIENCY, FRACTURES): Vit D, 25-Hydroxy: 27.6 ng/mL — ABNORMAL LOW (ref 30.0–100.0)

## 2023-01-14 LAB — MAGNESIUM: Magnesium: 1.8 mg/dL (ref 1.6–2.3)

## 2023-01-14 LAB — TSH: TSH: 2.24 u[IU]/mL (ref 0.450–4.500)

## 2023-01-14 NOTE — Progress Notes (Signed)
Good afternoon, please let Rishita know her labs have returned and overall remain stable and at baseline for her with exception of LDL (bad cholesterol) was a little elevated.  Please ensure you are taking Atorvastatin daily and would like to recheck with you fasting next visit please.  Vitamin D also a little low, please ensure you take Vitamin D3 2000 units daily, which you can obtain at the drug store.  Any questions? Keep being amazing!!  Thank you for allowing me to participate in your care.  I appreciate you. Kindest regards, Jonatha Gagen

## 2023-02-08 DIAGNOSIS — I4819 Other persistent atrial fibrillation: Secondary | ICD-10-CM | POA: Diagnosis not present

## 2023-02-08 DIAGNOSIS — R0602 Shortness of breath: Secondary | ICD-10-CM | POA: Diagnosis not present

## 2023-02-08 DIAGNOSIS — I340 Nonrheumatic mitral (valve) insufficiency: Secondary | ICD-10-CM | POA: Diagnosis not present

## 2023-02-08 DIAGNOSIS — I5022 Chronic systolic (congestive) heart failure: Secondary | ICD-10-CM | POA: Diagnosis not present

## 2023-02-08 DIAGNOSIS — I10 Essential (primary) hypertension: Secondary | ICD-10-CM | POA: Diagnosis not present

## 2023-02-08 DIAGNOSIS — I351 Nonrheumatic aortic (valve) insufficiency: Secondary | ICD-10-CM | POA: Diagnosis not present

## 2023-02-08 DIAGNOSIS — I420 Dilated cardiomyopathy: Secondary | ICD-10-CM | POA: Diagnosis not present

## 2023-02-24 ENCOUNTER — Other Ambulatory Visit: Payer: Self-pay | Admitting: Nurse Practitioner

## 2023-02-24 NOTE — Telephone Encounter (Signed)
Requested Prescriptions  Refused Prescriptions Disp Refills   sertraline (ZOLOFT) 25 MG tablet [Pharmacy Med Name: SERTRALINE 25MG  TABLETS] 90 tablet 4    Sig: TAKE 1 TABLET(25 MG) BY MOUTH DAILY     Psychiatry:  Antidepressants - SSRI - sertraline Passed - 02/24/2023 11:21 AM      Passed - AST in normal range and within 360 days    AST  Date Value Ref Range Status  01/13/2023 13 0 - 40 IU/L Final         Passed - ALT in normal range and within 360 days    ALT  Date Value Ref Range Status  01/13/2023 8 0 - 32 IU/L Final         Passed - Completed PHQ-2 or PHQ-9 in the last 360 days      Passed - Valid encounter within last 6 months    Recent Outpatient Visits           1 month ago Chronic systolic CHF (congestive heart failure) (HCC)   [place] [facility] [place], Jolene T, NP   7 months ago Chronic systolic CHF (congestive heart failure) (HCC)   [place] [facility] [place], [place] T, NP   1 year ago Chronic systolic CHF (congestive heart failure) (HCC)   [place] [facility] [place], [place] T, NP   1 year ago Chronic systolic CHF (congestive heart failure) (HCC)   [place] [facility] [place], [place] T, NP   2 years ago Chronic systolic CHF (congestive heart failure) (HCC)   [place] [facility] [place], Dorie Rank, NP       Future Appointments             In 4 months [name], Dorie Rank, NP [facility], [facility]

## 2023-02-24 NOTE — Telephone Encounter (Signed)
Requested Prescriptions  Refused Prescriptions Disp Refills   sertraline (ZOLOFT) 25 MG tablet [Pharmacy Med Name: SERTRALINE 25MG  TABLETS] 90 tablet 4    Sig: TAKE 1 TABLET(25 MG) BY MOUTH DAILY     Psychiatry:  Antidepressants - SSRI - sertraline Passed - 02/24/2023  3:24 AM      Passed - AST in normal range and within 360 days    AST  Date Value Ref Range Status  01/13/2023 13 0 - 40 IU/L Final         Passed - ALT in normal range and within 360 days    ALT  Date Value Ref Range Status  01/13/2023 8 0 - 32 IU/L Final         Passed - Completed PHQ-2 or PHQ-9 in the last 360 days      Passed - Valid encounter within last 6 months    Recent Outpatient Visits           1 month ago Chronic systolic CHF (congestive heart failure) (HCC)   [place] [facility] [place], Jolene T, NP   7 months ago Chronic systolic CHF (congestive heart failure) (HCC)   [place] [facility] [place], [place] T, NP   1 year ago Chronic systolic CHF (congestive heart failure) (HCC)   [place] [facility] [place], [place] T, NP   1 year ago Chronic systolic CHF (congestive heart failure) (HCC)   [place] [facility] [place], [place] T, NP   2 years ago Chronic systolic CHF (congestive heart failure) (HCC)   [place] [facility] [place], Dorie Rank, NP       Future Appointments             In 4 months [name], Dorie Rank, NP [place] [facility]

## 2023-03-13 ENCOUNTER — Other Ambulatory Visit: Payer: Self-pay | Admitting: Nurse Practitioner

## 2023-03-15 NOTE — Telephone Encounter (Signed)
Refilled 01/13/23 #90 with 4 refills. Requested Prescriptions  Refused Prescriptions Disp Refills   atorvastatin (LIPITOR) 20 MG tablet [Pharmacy Med Name: ATORVASTATIN 20MG  TABLETS] 90 tablet 4    Sig: TAKE 1 TABLET(20 MG) BY MOUTH AT BEDTIME     Cardiovascular:  Antilipid - Statins Failed - 03/13/2023  3:20 AM      Failed - Lipid Panel in normal range within the last 12 months    Cholesterol, Total  Date Value Ref Range Status  01/13/2023 169 100 - 199 mg/dL Final   Cholesterol Piccolo, Waived  Date Value Ref Range Status  12/22/2015 162 <200 mg/dL Final    Comment:                            Desirable                <200                         Borderline High      200- 239                         High                     >239    LDL Chol Calc (NIH)  Date Value Ref Range Status  01/13/2023 101 (H) 0 - 99 mg/dL Final   HDL  Date Value Ref Range Status  01/13/2023 46 >39 mg/dL Final   Triglycerides  Date Value Ref Range Status  01/13/2023 124 0 - 149 mg/dL Final   Triglycerides Piccolo,Waived  Date Value Ref Range Status  12/22/2015 137 <150 mg/dL Final    Comment:                            Normal                   <150                         Borderline High     150 - 199                         High                200 - 499                         Very High                >499          Passed - Patient is not pregnant      Passed - Valid encounter within last 12 months    Recent Outpatient Visits           2 months ago Chronic systolic CHF (congestive heart failure) (HCC)   [place] [facility] [place], Jolene T, NP   8 months ago Chronic systolic CHF (congestive heart failure) (HCC)   [place] [facility] [place], [place] T, NP   1 year ago Chronic systolic CHF (congestive heart failure) (HCC)   [place] [facility] [place], [place] T, NP   1 year ago Chronic systolic CHF (congestive heart failure) (HCC)  [place] [facility] [place], [place] T, NP   2 years ago Chronic systolic CHF (congestive heart failure) (HCC)   [place] [facility] [place], Dorie Rank, NP       Future Appointments             In 4 months [name], Dorie Rank, NP [place] [facility]             traZODone (DESYREL) 50 MG tablet [Pharmacy Med Name: TRAZODONE 50MG  TABLETS] 90 tablet 4    Sig: TAKE 1 TABLET(50 MG) BY MOUTH AT BEDTIME     Psychiatry: Antidepressants - Serotonin Modulator Passed - 03/13/2023  3:20 AM      Passed - Completed PHQ-2 or PHQ-9 in the last 360 days      Passed - Valid encounter within last 6 months    Recent Outpatient Visits           2 months ago Chronic systolic CHF (congestive heart failure) (HCC)   [place] [facility] [place], Jolene T, NP   8 months ago Chronic systolic CHF (congestive heart failure) (HCC)   [place] [facility] [place], Corrie Dandy T, NP   1 year ago Chronic systolic CHF (congestive heart failure) (HCC)   [facility] [facility] [place], Corrie Dandy T, NP   1 year ago Chronic systolic CHF (congestive heart failure) (HCC)   [place] [facility] [place], [place] T, NP   2 years ago Chronic systolic CHF (congestive heart failure) (HCC)   [place] [facility] [place], Dorie Rank, NP       Future Appointments             In 4 months [name], Dorie Rank, NP [facility]

## 2023-04-12 ENCOUNTER — Other Ambulatory Visit: Payer: Self-pay

## 2023-04-12 MED ORDER — FUROSEMIDE 40 MG PO TABS: 40.0000 mg | ORAL_TABLET | Freq: Two times a day (BID) | ORAL | 4 refills | Status: DC

## 2023-04-12 NOTE — Telephone Encounter (Signed)
Medication refill for Furosemide 40 mg last ov 01/13/23, upcoming ov 07/15/23 . Please advise

## 2023-04-13 ENCOUNTER — Telehealth: Payer: Self-pay | Admitting: Nurse Practitioner

## 2023-04-13 NOTE — Telephone Encounter (Signed)
Pt is calling in requesting Jolene or her nurse call her regarding medication. Pt says she received a call yesterday, but she wasn't home.

## 2023-04-14 NOTE — Telephone Encounter (Signed)
Spoke with patient and explained that she was not called from the office.

## 2023-05-25 ENCOUNTER — Other Ambulatory Visit: Payer: Self-pay | Admitting: Nurse Practitioner

## 2023-05-25 NOTE — Telephone Encounter (Signed)
Unable to refill per protocol, Rx request is too soon. Last refill 01/13/23 for 90 and 4 refills.  Requested Prescriptions  Pending Prescriptions Disp Refills   omeprazole (PRILOSEC) 20 MG capsule [Pharmacy Med Name: OMEPRAZOLE 20MG  CAPSULES] 90 capsule 4    Sig: TAKE 1 CAPSULE(20 MG) BY MOUTH DAILY     Gastroenterology: Proton Pump Inhibitors Passed - 05/25/2023  3:24 AM      Passed - Valid encounter within last 12 months    Recent Outpatient Visits           4 months ago Chronic systolic CHF (congestive heart failure) (HCC)   [place] [facility] [place], Jolene T, NP   10 months ago Chronic systolic CHF (congestive heart failure) (HCC)   [place] [facility] [place], Lena T, NP   1 year ago Chronic systolic CHF (congestive heart failure) (HCC)   [place] [facility] [place], Corrie Dandy T, NP   1 year ago Chronic systolic CHF (congestive heart failure) (HCC)   [place] [facility] [place], Deltana T, NP   2 years ago Chronic systolic CHF (congestive heart failure) (HCC)   [place] [facility] [place], Dorie Rank, NP       Future Appointments             In 1 month [name], Dorie Rank, NP  [facility]

## 2023-06-07 DIAGNOSIS — I4819 Other persistent atrial fibrillation: Secondary | ICD-10-CM | POA: Diagnosis not present

## 2023-07-10 NOTE — Patient Instructions (Signed)
Heart Failure Eating Plan Heart failure, also called congestive heart failure, occurs when your heart does not pump blood well enough to meet your body's needs for oxygen-rich blood. Heart failure is a long-term (chronic) condition. Living with heart failure can be challenging. Following your health care provider's instructions about a healthy lifestyle and working with a dietitian to choose the right foods may help to improve your symptoms. An eating plan for someone with heart failure will include changes that limit the intake of salt (sodium) and unhealthy fat. What are tips for following this plan? Reading food labels Check food labels for the amount of sodium per serving. Choose foods that have less than 140 mg (milligrams) of sodium in each serving. Check food labels for the number of calories per serving. This is important if you need to limit your daily calorie intake to lose weight. Check food labels for the serving size. If you eat more than one serving, you will be eating more sodium and calories than what is listed on the label. Look for foods that are labeled as "sodium-free," "very low sodium," or "low sodium." Foods labeled as "reduced sodium" or "lightly salted" may still have more sodium than what is recommended for you. Cooking Avoid adding salt when cooking. Ask your health care provider or dietitian before using salt substitutes. Season food with salt-free seasonings, spices, or herbs. Check the label of seasoning mixes to make sure they do not contain salt. Cook with heart-healthy oils, such as olive, canola, soybean, or sunflower oil. Do not fry foods. Cook foods using low-fat methods, such as baking, boiling, grilling, and broiling. Limit unhealthy fats when cooking by: Removing the skin from poultry, such as chicken. Removing all visible fats from meats. Skimming the fat off from stews, soups, and gravies before serving them. Meal planning  Limit your intake  of: Processed, canned, or prepackaged foods. Foods that are high in trans fat, such as fried foods. Sweets, desserts, sugary drinks, and other foods with added sugar. Full-fat dairy products, such as whole milk. Eat a balanced diet. This may include: 4-5 servings of fruit each day and 4-5 servings of vegetables each day. At each meal, try to fill one-half of your plate with fruits and vegetables. Up to 6-8 servings of whole grains each day. Up to 2 servings of lean meat, poultry, or fish each day. One serving of meat is equal to 3 oz (85 g). This is about the same size as a deck of cards. 2 servings of low-fat dairy each day. Heart-healthy fats. Healthy fats called omega-3 fatty acids are found in foods such as flaxseed and cold-water fish like sardines, salmon, and mackerel. Aim to eat 25-35 g (grams) of fiber a day. Foods that are high in fiber include apples, broccoli, carrots, beans, peas, and whole grains. Do not add salt or condiments that contain salt (such as soy sauce) to foods before eating. When eating at a restaurant, ask that your food be prepared with less salt or no salt, if possible. Try to eat 2 or more vegetarian meals each week. Eat more home-cooked food and eat less restaurant, buffet, and fast food. General information Do not eat more than 2,300 mg of sodium a day. The amount of sodium that is recommended for you may be lower, depending on your condition. Maintain a healthy body weight as directed. Ask your health care provider what a healthy weight is for you. Check your weight every day. Work with your health care provider   and dietitian to make a plan that is right for you to lose weight or maintain your current weight. Limit how much fluid you drink. Ask your health care provider or dietitian how much fluid you can have each day. Limit or avoid alcohol as told by your health care provider or dietitian. Recommended foods Fruits All fresh, frozen, and canned fruits.  Dried fruits, such as raisins, prunes, and cranberries. Vegetables All fresh vegetables. Vegetables that are frozen without sauce or added salt. Low-sodium or sodium-free canned vegetables. Grains Bread with less than 80 mg of sodium per slice. Whole-wheat pasta, quinoa, and brown rice. Oats and oatmeal. Barley. Millet. Grits and cream of wheat. Whole-grain and whole-wheat cold cereal. Meats and other protein foods Lean cuts of meat. Skinless chicken and turkey. Fish with high omega-3 fatty acids, such as salmon, sardines, and other cold-water fishes. Eggs. Dried beans, peas, and edamame. Unsalted nuts and nut butters. Dairy Low-fat or nonfat (skim) milk and dried milk. Rice milk, soy milk, and almond milk. Low-fat or nonfat yogurt. Small amounts of reduced-sodium block cheese. Low-sodium cottage cheese. Fats and oils Olive, canola, soybean, flaxseed, avocado, or sunflower oil. Sweets and desserts Applesauce. Granola bars. Sugar-free pudding and gelatin. Frozen fruit bars. Seasoning and other foods Fresh and dried herbs. Lemon or lime juice. Vinegar. Low-sodium ketchup. Salt-free marinades, salad dressings, sauces, and seasonings. The items listed above may not be a complete list of foods and beverages you can eat. Contact a dietitian for more information. Foods to avoid Fruits Fruits that are dried with sodium-containing preservatives. Vegetables Canned vegetables. Frozen vegetables with sauce or seasonings. Creamed vegetables. French fries. Onion rings. Pickled vegetables and sauerkraut. Grains Bread with more than 80 mg of sodium per slice. Hot or cold cereal with more than 140 mg sodium per serving. Salted pretzels and crackers. Prepackaged breadcrumbs. Bagels, croissants, and biscuits. Meats and other protein foods Ribs and chicken wings. Bacon, ham, pepperoni, bologna, salami, and packaged luncheon meats. Hot dogs, bratwurst, and sausage. Canned meat. Smoked meat and fish. Salted nuts  and seeds. Dairy Whole milk, half-and-half, and cream. Buttermilk. Processed cheese, cheese spreads, and cheese curds. Regular cottage cheese. Feta cheese. Shredded cheese. String cheese. Fats and oils Butter, lard, shortening, ghee, and bacon fat. Canned and packaged gravies. Seasoning and other foods Onion salt, garlic salt, table salt, and sea salt. Marinades. Regular salad dressings. Relishes, pickles, and olives. Meat flavorings and tenderizers, and bouillon cubes. Horseradish, ketchup, and mustard. Worcestershire sauce. Teriyaki sauce, soy sauce (including reduced sodium). Hot sauce and Tabasco sauce. Steak sauce, fish sauce, oyster sauce, and cocktail sauce. Taco seasonings. Barbecue sauce. Tartar sauce. The items listed above may not be a complete list of foods and beverages you should avoid. Contact a dietitian for more information. Summary A heart failure eating plan includes changes that limit your intake of sodium and unhealthy fat, and it may help you lose weight or maintain a healthy weight. Your health care provider may also recommend limiting how much fluid you drink. Most people with heart failure should eat no more than 2,300 mg of salt (sodium) a day. The amount of sodium that is recommended for you may be lower, depending on your condition. Contact your health care provider or dietitian before making any major changes to your diet. This information is not intended to replace advice given to you by your health care provider. Make sure you discuss any questions you have with your health care provider. Document Revised: 02/09/2022 Document Reviewed: 06/16/2020   [employer]vier Patient Education  2024 [employer]  

## 2023-07-15 ENCOUNTER — Encounter: Payer: Self-pay | Admitting: Nurse Practitioner

## 2023-07-15 ENCOUNTER — Ambulatory Visit (INDEPENDENT_AMBULATORY_CARE_PROVIDER_SITE_OTHER): Payer: Medicare Other | Admitting: Nurse Practitioner

## 2023-07-15 VITALS — BP 124/73 | HR 76 | Temp 97.5°F | Ht 62.9 in | Wt 137.4 lb

## 2023-07-15 DIAGNOSIS — I482 Chronic atrial fibrillation, unspecified: Secondary | ICD-10-CM

## 2023-07-15 DIAGNOSIS — Z8679 Personal history of other diseases of the circulatory system: Secondary | ICD-10-CM

## 2023-07-15 DIAGNOSIS — R7301 Impaired fasting glucose: Secondary | ICD-10-CM | POA: Diagnosis not present

## 2023-07-15 DIAGNOSIS — I351 Nonrheumatic aortic (valve) insufficiency: Secondary | ICD-10-CM | POA: Diagnosis not present

## 2023-07-15 DIAGNOSIS — F325 Major depressive disorder, single episode, in full remission: Secondary | ICD-10-CM | POA: Diagnosis not present

## 2023-07-15 DIAGNOSIS — I5022 Chronic systolic (congestive) heart failure: Secondary | ICD-10-CM | POA: Diagnosis not present

## 2023-07-15 DIAGNOSIS — I10 Essential (primary) hypertension: Secondary | ICD-10-CM | POA: Diagnosis not present

## 2023-07-15 DIAGNOSIS — E7800 Pure hypercholesterolemia, unspecified: Secondary | ICD-10-CM

## 2023-07-15 DIAGNOSIS — Z23 Encounter for immunization: Secondary | ICD-10-CM | POA: Diagnosis not present

## 2023-07-15 DIAGNOSIS — F5101 Primary insomnia: Secondary | ICD-10-CM | POA: Diagnosis not present

## 2023-07-15 DIAGNOSIS — M85852 Other specified disorders of bone density and structure, left thigh: Secondary | ICD-10-CM

## 2023-07-15 DIAGNOSIS — I700 Atherosclerosis of aorta: Secondary | ICD-10-CM | POA: Diagnosis not present

## 2023-07-15 LAB — BAYER DCA HB A1C WAIVED: HB A1C (BAYER DCA - WAIVED): 6.1 % — ABNORMAL HIGH (ref 4.8–5.6)

## 2023-07-15 NOTE — Progress Notes (Signed)
BP 124/73   Pulse 76   Temp (!) 97.5 F (36.4 C) (Oral)   Ht 5' 2.9" (1.598 m)   Wt 137 lb 6.4 oz (62.3 kg)   LMP  (LMP Unknown)   SpO2 97%   BMI 24.42 kg/m    Subjective:    Patient ID: Melinda Preston, female    DOB: Nov 07, 1928, 89 y.o.   MRN: [id number]  HPI: Melinda Preston is a 89 y.o. female  Chief Complaint  Patient presents with   Depression   Hyperlipidemia   Hypertension   Congestive Heart Failure   HYPERTENSION / HYPERLIPIDEMIA/HF Last saw Dr. Cassie Freer (cardiology) - last visit 06/07/23 with no changes.  Continues on Entresto 24-26 MG and Carvedilol 6.25 MG BID + taking Lasix, Atorvastatin and K + daily. HFrEF 35% NYHA Class II-II, ACC/AHA stage C with mild aortic stenosis on 03/17/22.    On recent checks A1c 6.1 to 6.5% range with recent 6.1% in February.  Focuses on diet and regular activity. Satisfied with current treatment? yes Duration of hypertension: chronic BP monitoring frequency: occasional checks BP range: 120/70's at home on average BP medication side effects: no Duration of hyperlipidemia: chronic Cholesterol medication side effects: no Cholesterol supplements: fish oil Medication compliance: good compliance Aspirin: yes Recent stressors: no Recurrent headaches: no Visual changes: no Palpitations: no Dyspnea: occasional if walking fast Chest pain: no Lower extremity edema: no Dizzy/lightheaded: no    ATRIAL FIBRILLATION Atrial fibrillation status: stable Satisfied with current treatment: yes  Medication side effects:  no Medication compliance: good compliance Etiology of atrial fibrillation:  Palpitations:  no Chest pain:  no Dyspnea on exertion:  occasional if she is walking fast Orthopnea:  no Syncope:  no Edema:  no Ventricular rate control: Carvedilol and Entresto Anti-coagulation: no longer taking due to falls and history of subdural hematoma  DEPRESSION Continues on Sertraline 25 MG daily and Trazodone 50 MG at  night. Mood status: stable Satisfied with current treatment?: yes Symptom severity: mild  Duration of current treatment : chronic Side effects: no Medication compliance: good compliance Depressed mood: no Anxious mood: no Anhedonia: no Significant weight loss or gain: no Insomnia: no Fatigue: no Feelings of worthlessness or guilt: no Impaired concentration/indecisiveness: no Suicidal ideations: no Hopelessness: no Crying spells: no    07/15/2023    1:15 PM 01/13/2023   10:52 AM 07/26/2022   10:46 AM 07/08/2022   11:03 AM 01/08/2022   11:23 AM  Depression screen PHQ 2/9  Decreased Interest 0 0 0 0 0  Down, Depressed, Hopeless 0 0 0 0 0  PHQ - 2 Score 0 0 0 0 0  Altered sleeping 0 0 0 0 0  Tired, decreased energy 0 0 0 0 0  Change in appetite 0 0 0 0 0  Feeling bad or failure about yourself  0 0 0 0 0  Trouble concentrating 0 0 0 0 0  Moving slowly or fidgety/restless 0 0 0 0 0  Suicidal thoughts 0 0 0 0 0  PHQ-9 Score 0 0 0 0 0  Difficult doing work/chores Not difficult at all Not difficult at all Not difficult at all Not difficult at all Not difficult at all      07/15/2023    1:16 PM 01/13/2023   10:52 AM 07/08/2022   11:03 AM 06/05/2019   10:07 AM  GAD 7 : Generalized Anxiety Score  Nervous, Anxious, on Edge 0 0 0 0  Control/stop worrying 0 0 0 0  Worry too much - different things 0 0 0 0  Trouble relaxing 0 0 0 0  Restless 0 0 0 0  Easily annoyed or irritable 0 0 0 0  Afraid - awful might happen 0 0 0 0  Total GAD 7 Score 0 0 0 0  Anxiety Difficulty Not difficult at all Not difficult at all Not difficult at all Not difficult at all   Relevant past medical, surgical, family and social history reviewed and updated as indicated. Interim medical history since our last visit reviewed. Allergies and medications reviewed and updated.  Review of Systems  Constitutional:  Negative for activity change, appetite change, diaphoresis, fatigue and fever.  Respiratory:   Negative for cough, chest tightness and shortness of breath.   Cardiovascular:  Negative for chest pain, palpitations and leg swelling.  Gastrointestinal: Negative.   Endocrine: Negative for cold intolerance, heat intolerance, polydipsia, polyphagia and polyuria.  Neurological: Negative.   Psychiatric/Behavioral: Negative.      Per HPI unless specifically indicated above     Objective:    BP 124/73   Pulse 76   Temp (!) 97.5 F (36.4 C) (Oral)   Ht 5' 2.9" (1.598 m)   Wt 137 lb 6.4 oz (62.3 kg)   LMP  (LMP Unknown)   SpO2 97%   BMI 24.42 kg/m   Wt Readings from Last 3 Encounters:  07/15/23 137 lb 6.4 oz (62.3 kg)  01/13/23 145 lb 1.6 oz (65.8 kg)  07/08/22 149 lb 11.2 oz (67.9 kg)    Physical Exam Vitals and nursing note reviewed.  Constitutional:      General: She is awake. She is not in acute distress.    Appearance: She is well-developed. She is not ill-appearing.  HENT:     Head: Normocephalic.     Right Ear: Hearing normal.     Left Ear: Hearing normal.     Nose: Nose normal.     Mouth/Throat:     Mouth: Mucous membranes are moist.  Eyes:     General: Lids are normal.        Right eye: No discharge.        Left eye: No discharge.     Conjunctiva/sclera: Conjunctivae normal.     Pupils: Pupils are equal, round, and reactive to light.  Neck:     Thyroid: No thyromegaly.     Vascular: No carotid bruit or JVD.  Cardiovascular:     Rate and Rhythm: Normal rate. Rhythm irregularly irregular.     Heart sounds: Murmur heard.     Systolic murmur is present with a grade of 2/6.     No gallop.     Comments: Systolic murmur with radiation to carotids. Pulmonary:     Effort: Pulmonary effort is normal.     Breath sounds: Normal breath sounds.  Abdominal:     General: Bowel sounds are normal.     Palpations: Abdomen is soft.  Musculoskeletal:     Cervical back: Normal range of motion and neck supple.     Right lower leg: No edema.     Left lower leg: No edema.   Lymphadenopathy:     Cervical: No cervical adenopathy.  Skin:    General: Skin is warm and dry.  Neurological:     Mental Status: She is alert and oriented to person, place, and time.  Psychiatric:        Attention and Perception: Attention normal.        Mood  and Affect: Mood normal.        Behavior: Behavior normal. Behavior is cooperative.        Thought Content: Thought content normal.        Judgment: Judgment normal.    Results for orders placed or performed in visit on 01/13/23  Bayer DCA Hb A1c Waived  Result Value Ref Range   HB A1C (BAYER DCA - WAIVED) 6.1 (H) 4.8 - 5.6 %  Microalbumin, Urine Waived  Result Value Ref Range   Microalb, Ur Waived 80 (H) 0 - 19 mg/L   Creatinine, Urine Waived 100 10 - 300 mg/dL   Microalb/Creat Ratio 30-300 (H) <30 mg/g  CBC with Differential/Platelet  Result Value Ref Range   WBC 5.9 3.4 - 10.8 x10E3/uL   RBC 5.79 (H) 3.77 - 5.28 x10E6/uL   Hemoglobin 14.8 11.1 - 15.9 g/dL   Hematocrit 44.0 34.7 - 46.6 %   MCV 80 79 - 97 fL   MCH 25.6 (L) 26.6 - 33.0 pg   MCHC 32.0 31.5 - 35.7 g/dL   RDW 42.5 95.6 - 38.7 %   Platelets 190 150 - 450 x10E3/uL   Neutrophils 62 Not Estab. %   Lymphs 28 Not Estab. %   Monocytes 9 Not Estab. %   Eos 1 Not Estab. %   Basos 0 Not Estab. %   Neutrophils Absolute 3.7 1.4 - 7.0 x10E3/uL   Lymphocytes Absolute 1.6 0.7 - 3.1 x10E3/uL   Monocytes Absolute 0.5 0.1 - 0.9 x10E3/uL   EOS (ABSOLUTE) 0.1 0.0 - 0.4 x10E3/uL   Basophils Absolute 0.0 0.0 - 0.2 x10E3/uL   Immature Granulocytes 0 Not Estab. %   Immature Grans (Abs) 0.0 0.0 - 0.1 x10E3/uL  Comprehensive metabolic panel  Result Value Ref Range   Glucose 117 (H) 70 - 99 mg/dL   BUN 14 10 - 36 mg/dL   Creatinine, Ser 5.64 0.57 - 1.00 mg/dL   eGFR 61 >33 IR/JJO/8.41   BUN/Creatinine Ratio 16 12 - 28   Sodium 143 134 - 144 mmol/L   Potassium 4.5 3.5 - 5.2 mmol/L   Chloride 100 96 - 106 mmol/L   CO2 30 (H) 20 - 29 mmol/L   Calcium 9.4 8.7 - 10.3  mg/dL   Total Protein 6.5 6.0 - 8.5 g/dL   Albumin 4.3 3.6 - 4.6 g/dL   Globulin, Total 2.2 1.5 - 4.5 g/dL   Albumin/Globulin Ratio 2.0 1.2 - 2.2   Bilirubin Total 0.5 0.0 - 1.2 mg/dL   Alkaline Phosphatase 76 44 - 121 IU/L   AST 13 0 - 40 IU/L   ALT 8 0 - 32 IU/L  Lipid Panel w/o Chol/HDL Ratio  Result Value Ref Range   Cholesterol, Total 169 100 - 199 mg/dL   Triglycerides 660 0 - 149 mg/dL   HDL 46 >63 mg/dL   VLDL Cholesterol Cal 22 5 - 40 mg/dL   LDL Chol Calc (NIH) 016 (H) 0 - 99 mg/dL  TSH  Result Value Ref Range   TSH 2.240 0.450 - 4.500 uIU/mL  Magnesium  Result Value Ref Range   Magnesium 1.8 1.6 - 2.3 mg/dL  VITAMIN D 25 Hydroxy (Vit-D Deficiency, Fractures)  Result Value Ref Range   Vit D, 25-Hydroxy 27.6 (L) 30.0 - 100.0 ng/mL      Assessment & Plan:   Problem List Items Addressed This Visit       Cardiovascular and Mediastinum   Aortic atherosclerosis (HCC)    Chronic.  Noted on CXR 10/01/2016.  She is to remain off ASA, continue statin daily and adjust as needed.      Relevant Orders   Comprehensive metabolic panel   Lipid Panel w/o Chol/HDL Ratio   Chronic atrial fibrillation (HCC)    Chronic, stable.  Continue current medication regimen and collaboration with cardiology. She is to remain off ASA and Eliquis indefinitely on review of past neurosurgery and cardiology notes, she is aware of this.  High fall risk and history of subdural hematoma.  Return in 6 months.      Relevant Orders   Comprehensive metabolic panel   Lipid Panel w/o Chol/HDL Ratio   Chronic systolic CHF (congestive heart failure) (HCC) - Primary    Chronic, ongoing.  Euvolemic today.  Continue current medication regimen, recent changes made, and collaboration with cardiology.  - Reminded to call for an overnight weight gain of >2 pounds or a weekly weight weight of >5 pounds - not adding salt to his food and has been reading food labels. Reviewed the importance of keeping daily  sodium intake to 2000mg  daily  - Avoid NSAIDs - Has lost weight, but she has been trying to by eating healthy.      Relevant Orders   Comprehensive metabolic panel   Lipid Panel w/o Chol/HDL Ratio   Essential hypertension    Chronic, stable.  BP at goal for age today.  Continue current medication regimen and collaboration with cardiology.  Monitor for hypotension and falls, reduce medication as needed. Recommend she monitor BP at least 3 days a week at home and document for provider visits.  Focus on DASH diet at home.  LABS: CMP.  Return in 6 months.      Relevant Orders   Comprehensive metabolic panel   Nonrheumatic aortic valve insufficiency    Chronic, stable.  Continue collaboration with cardiology, recent note reviewed.        Endocrine   IFG (impaired fasting glucose)    Ongoing.  6.1% today.  Goal for age is A1c <8% per geriatric society, higher risk for falls due to past falls.  Avoid medication at this time and monitor levels.        Relevant Orders   Bayer DCA Hb A1c Waived     Other   Depression    Chronic, stable.  Denies SI/HI.  Continue current medication regimen and adjust as needed.  Monitor NA+ level with Sertraline and advanced age.  Return in 6 months.        History of subdural hematoma 03/28/20    No further falls.  She is to continue off Eliquis and ASA on review of cardiology and neurosurgery notes, she is aware of this.  Continue to closely monitor.      Hypercholesterolemia    Chronic, ongoing.  Continue current medication regimen and adjust as needed.  Lipid panel today.      Relevant Orders   Comprehensive metabolic panel   Lipid Panel w/o Chol/HDL Ratio   Insomnia    Chronic, stable with Trazodone.  Continue current regimen and adjust as needed.  Refills up to date.      Other Visit Diagnoses     Need for influenza vaccination       Flu vaccine provided today.   Relevant Orders   Flu Vaccine Trivalent High Dose (Fluad)         Follow up plan: Return in about 6 months (around 01/13/2024) for HTN/HLD, HF, GERD, OSTEOPENIA, MOOD.

## 2023-07-15 NOTE — Assessment & Plan Note (Signed)
Chronic, stable.  Continue current medication regimen and collaboration with cardiology. She is to remain off ASA and Eliquis indefinitely on review of past neurosurgery and cardiology notes, she is aware of this.  High fall risk and history of subdural hematoma.  Return in 6 months. 

## 2023-07-15 NOTE — Assessment & Plan Note (Signed)
Chronic, stable with Trazodone.  Continue current regimen and adjust as needed.  Refills up to date.

## 2023-07-15 NOTE — Assessment & Plan Note (Signed)
Chronic, ongoing.  Euvolemic today.  Continue current medication regimen, recent changes made, and collaboration with cardiology.  - Reminded to call for an overnight weight gain of >2 pounds or a weekly weight weight of >5 pounds - not adding salt to his food and has been reading food labels. Reviewed the importance of keeping daily sodium intake to 2000mg  daily  - Avoid NSAIDs - Has lost weight, but she has been trying to by eating healthy.

## 2023-07-15 NOTE — Assessment & Plan Note (Signed)
Chronic, stable.  Continue collaboration with cardiology, recent note reviewed.

## 2023-07-15 NOTE — Assessment & Plan Note (Signed)
Chronic, stable.  Denies SI/HI.  Continue current medication regimen and adjust as needed.  Monitor NA+ level with Sertraline and advanced age.  Return in 6 months.   

## 2023-07-15 NOTE — Assessment & Plan Note (Signed)
Chronic.  Noted on CXR 10/01/2016.  She is to remain off ASA, continue statin daily and adjust as needed. 

## 2023-07-15 NOTE — Assessment & Plan Note (Signed)
Chronic, ongoing.  Continue current medication regimen and adjust as needed. Lipid panel today. 

## 2023-07-15 NOTE — Assessment & Plan Note (Signed)
Ongoing.  6.1% today.  Goal for age is A1c <8% per geriatric society, higher risk for falls due to past falls.  Avoid medication at this time and monitor levels.

## 2023-07-15 NOTE — Assessment & Plan Note (Signed)
No further falls.  She is to continue off Eliquis and ASA on review of cardiology and neurosurgery notes, she is aware of this.  Continue to closely monitor.

## 2023-07-15 NOTE — Assessment & Plan Note (Signed)
Chronic, stable.  BP at goal for age today.  Continue current medication regimen and collaboration with cardiology.  Monitor for hypotension and falls, reduce medication as needed. Recommend she monitor BP at least 3 days a week at home and document for provider visits.  Focus on DASH diet at home.  LABS: CMP.  Return in 6 months.

## 2023-07-16 LAB — COMPREHENSIVE METABOLIC PANEL
ALT: 9 IU/L (ref 0–32)
AST: 15 IU/L (ref 0–40)
Albumin: 4.5 g/dL (ref 3.6–4.6)
Alkaline Phosphatase: 54 IU/L (ref 44–121)
BUN/Creatinine Ratio: 19 (ref 12–28)
BUN: 16 mg/dL (ref 10–36)
Bilirubin Total: 0.7 mg/dL (ref 0.0–1.2)
CO2: 28 mmol/L (ref 20–29)
Calcium: 9.3 mg/dL (ref 8.7–10.3)
Chloride: 103 mmol/L (ref 96–106)
Creatinine, Ser: 0.86 mg/dL (ref 0.57–1.00)
Globulin, Total: 2.1 g/dL (ref 1.5–4.5)
Glucose: 102 mg/dL — ABNORMAL HIGH (ref 70–99)
Potassium: 4.3 mmol/L (ref 3.5–5.2)
Sodium: 147 mmol/L — ABNORMAL HIGH (ref 134–144)
Total Protein: 6.6 g/dL (ref 6.0–8.5)
eGFR: 63 mL/min/{1.73_m2} (ref >59)

## 2023-07-16 LAB — LIPID PANEL W/O CHOL/HDL RATIO
Cholesterol, Total: 152 mg/dL (ref 100–199)
HDL: 41 mg/dL (ref >39)
LDL Chol Calc (NIH): 88 mg/dL (ref 0–99)
Triglycerides: 129 mg/dL (ref 0–149)
VLDL Cholesterol Cal: 23 mg/dL (ref 5–40)

## 2023-07-17 NOTE — Progress Notes (Signed)
Good morning, please let Melinda Preston know her labs have returned and overall remain stable.  Kidney function normal, as is liver function.  Sodium, salt, level is a little high.  I recommend reduce any salt intake and ensure adequate water intake daily.  Cholesterol levels stable.  Any questions? Keep being amazing!!  Thank you for allowing me to participate in your care.  I appreciate you. Kindest regards, Zaden Sako

## 2023-07-26 DIAGNOSIS — H04129 Dry eye syndrome of unspecified lacrimal gland: Secondary | ICD-10-CM | POA: Diagnosis not present

## 2023-07-26 DIAGNOSIS — E113293 Type 2 diabetes mellitus with mild nonproliferative diabetic retinopathy without macular edema, bilateral: Secondary | ICD-10-CM | POA: Diagnosis not present

## 2023-07-26 DIAGNOSIS — H02831 Dermatochalasis of right upper eyelid: Secondary | ICD-10-CM | POA: Diagnosis not present

## 2023-07-26 DIAGNOSIS — H02839 Dermatochalasis of unspecified eye, unspecified eyelid: Secondary | ICD-10-CM | POA: Diagnosis not present

## 2023-07-26 LAB — HM DIABETES EYE EXAM

## 2023-08-03 ENCOUNTER — Encounter: Payer: Self-pay | Admitting: Nurse Practitioner

## 2023-08-26 ENCOUNTER — Telehealth: Payer: Self-pay | Admitting: Nurse Practitioner

## 2023-08-26 ENCOUNTER — Emergency Department: Payer: Medicare Other

## 2023-08-26 ENCOUNTER — Other Ambulatory Visit: Payer: Self-pay

## 2023-08-26 ENCOUNTER — Inpatient Hospital Stay
Admission: EM | Admit: 2023-08-26 | Discharge: 2023-08-29 | DRG: 963 | Disposition: A | Discharge status: Hospice | Payer: Medicare Other | Attending: Internal Medicine | Admitting: Internal Medicine

## 2023-08-26 ENCOUNTER — Encounter: Payer: Self-pay | Admitting: Emergency Medicine

## 2023-08-26 DIAGNOSIS — S72141A Displaced intertrochanteric fracture of right femur, initial encounter for closed fracture: Principal | ICD-10-CM

## 2023-08-26 DIAGNOSIS — I10 Essential (primary) hypertension: Secondary | ICD-10-CM | POA: Diagnosis not present

## 2023-08-26 DIAGNOSIS — I517 Cardiomegaly: Secondary | ICD-10-CM | POA: Diagnosis not present

## 2023-08-26 DIAGNOSIS — S72001A Fracture of unspecified part of neck of right femur, initial encounter for closed fracture: Secondary | ICD-10-CM | POA: Diagnosis present

## 2023-08-26 DIAGNOSIS — Z743 Need for continuous supervision: Secondary | ICD-10-CM | POA: Diagnosis not present

## 2023-08-26 DIAGNOSIS — Z9049 Acquired absence of other specified parts of digestive tract: Secondary | ICD-10-CM

## 2023-08-26 DIAGNOSIS — I4821 Permanent atrial fibrillation: Secondary | ICD-10-CM | POA: Diagnosis not present

## 2023-08-26 DIAGNOSIS — Z8679 Personal history of other diseases of the circulatory system: Secondary | ICD-10-CM

## 2023-08-26 DIAGNOSIS — W1830XA Fall on same level, unspecified, initial encounter: Secondary | ICD-10-CM | POA: Diagnosis present

## 2023-08-26 DIAGNOSIS — S065X0A Traumatic subdural hemorrhage without loss of consciousness, initial encounter: Secondary | ICD-10-CM | POA: Diagnosis not present

## 2023-08-26 DIAGNOSIS — Z79899 Other long term (current) drug therapy: Secondary | ICD-10-CM

## 2023-08-26 DIAGNOSIS — I482 Chronic atrial fibrillation, unspecified: Secondary | ICD-10-CM | POA: Diagnosis present

## 2023-08-26 DIAGNOSIS — I110 Hypertensive heart disease with heart failure: Secondary | ICD-10-CM | POA: Diagnosis present

## 2023-08-26 DIAGNOSIS — I447 Left bundle-branch block, unspecified: Secondary | ICD-10-CM | POA: Diagnosis present

## 2023-08-26 DIAGNOSIS — F419 Anxiety disorder, unspecified: Secondary | ICD-10-CM | POA: Diagnosis present

## 2023-08-26 DIAGNOSIS — E875 Hyperkalemia: Secondary | ICD-10-CM | POA: Diagnosis not present

## 2023-08-26 DIAGNOSIS — I6201 Nontraumatic acute subdural hemorrhage: Secondary | ICD-10-CM | POA: Diagnosis not present

## 2023-08-26 DIAGNOSIS — Z515 Encounter for palliative care: Secondary | ICD-10-CM | POA: Diagnosis not present

## 2023-08-26 DIAGNOSIS — Z888 Allergy status to other drugs, medicaments and biological substances status: Secondary | ICD-10-CM | POA: Diagnosis not present

## 2023-08-26 DIAGNOSIS — Y92009 Unspecified place in unspecified non-institutional (private) residence as the place of occurrence of the external cause: Secondary | ICD-10-CM | POA: Diagnosis not present

## 2023-08-26 DIAGNOSIS — D72829 Elevated white blood cell count, unspecified: Secondary | ICD-10-CM | POA: Diagnosis not present

## 2023-08-26 DIAGNOSIS — J9601 Acute respiratory failure with hypoxia: Secondary | ICD-10-CM | POA: Diagnosis present

## 2023-08-26 DIAGNOSIS — E119 Type 2 diabetes mellitus without complications: Secondary | ICD-10-CM | POA: Diagnosis not present

## 2023-08-26 DIAGNOSIS — E785 Hyperlipidemia, unspecified: Secondary | ICD-10-CM | POA: Diagnosis not present

## 2023-08-26 DIAGNOSIS — N179 Acute kidney failure, unspecified: Secondary | ICD-10-CM | POA: Diagnosis present

## 2023-08-26 DIAGNOSIS — S72009A Fracture of unspecified part of neck of unspecified femur, initial encounter for closed fracture: Secondary | ICD-10-CM | POA: Diagnosis not present

## 2023-08-26 DIAGNOSIS — M25551 Pain in right hip: Secondary | ICD-10-CM | POA: Diagnosis not present

## 2023-08-26 DIAGNOSIS — W19XXXA Unspecified fall, initial encounter: Secondary | ICD-10-CM

## 2023-08-26 DIAGNOSIS — Z9181 History of falling: Secondary | ICD-10-CM | POA: Diagnosis not present

## 2023-08-26 DIAGNOSIS — K219 Gastro-esophageal reflux disease without esophagitis: Secondary | ICD-10-CM | POA: Diagnosis present

## 2023-08-26 DIAGNOSIS — Z8249 Family history of ischemic heart disease and other diseases of the circulatory system: Secondary | ICD-10-CM

## 2023-08-26 DIAGNOSIS — I420 Dilated cardiomyopathy: Secondary | ICD-10-CM | POA: Diagnosis present

## 2023-08-26 DIAGNOSIS — S065XAA Traumatic subdural hemorrhage with loss of consciousness status unknown, initial encounter: Secondary | ICD-10-CM | POA: Diagnosis not present

## 2023-08-26 DIAGNOSIS — G4700 Insomnia, unspecified: Secondary | ICD-10-CM | POA: Diagnosis not present

## 2023-08-26 DIAGNOSIS — K449 Diaphragmatic hernia without obstruction or gangrene: Secondary | ICD-10-CM | POA: Diagnosis not present

## 2023-08-26 DIAGNOSIS — Z66 Do not resuscitate: Secondary | ICD-10-CM | POA: Diagnosis not present

## 2023-08-26 DIAGNOSIS — I6782 Cerebral ischemia: Secondary | ICD-10-CM | POA: Diagnosis not present

## 2023-08-26 DIAGNOSIS — S7010XA Contusion of unspecified thigh, initial encounter: Secondary | ICD-10-CM | POA: Diagnosis not present

## 2023-08-26 DIAGNOSIS — J449 Chronic obstructive pulmonary disease, unspecified: Secondary | ICD-10-CM | POA: Diagnosis not present

## 2023-08-26 DIAGNOSIS — I5022 Chronic systolic (congestive) heart failure: Secondary | ICD-10-CM | POA: Diagnosis not present

## 2023-08-26 DIAGNOSIS — R531 Weakness: Secondary | ICD-10-CM | POA: Diagnosis not present

## 2023-08-26 DIAGNOSIS — R296 Repeated falls: Secondary | ICD-10-CM | POA: Diagnosis present

## 2023-08-26 DIAGNOSIS — I6200 Nontraumatic subdural hemorrhage, unspecified: Secondary | ICD-10-CM | POA: Diagnosis not present

## 2023-08-26 LAB — CBC WITH DIFFERENTIAL/PLATELET
Abs Immature Granulocytes: 0.21 10*3/uL — ABNORMAL HIGH (ref 0.00–0.07)
Basophils Absolute: 0 10*3/uL (ref 0.0–0.1)
Basophils Relative: 0 %
Eosinophils Absolute: 0 10*3/uL (ref 0.0–0.5)
Eosinophils Relative: 0 %
HCT: 38.6 % (ref 36.0–46.0)
Hemoglobin: 12.5 g/dL (ref 12.0–15.0)
Immature Granulocytes: 2 %
Lymphocytes Relative: 7 %
Lymphs Abs: 0.9 10*3/uL (ref 0.7–4.0)
MCH: 26.3 pg (ref 26.0–34.0)
MCHC: 32.4 g/dL (ref 30.0–36.0)
MCV: 81.3 fL (ref 80.0–100.0)
Monocytes Absolute: 1.4 10*3/uL — ABNORMAL HIGH (ref 0.1–1.0)
Monocytes Relative: 10 %
Neutro Abs: 11.5 10*3/uL — ABNORMAL HIGH (ref 1.7–7.7)
Neutrophils Relative %: 81 %
Platelets: 217 10*3/uL (ref 150–400)
RBC: 4.75 MIL/uL (ref 3.87–5.11)
RDW: 14.2 % (ref 11.5–15.5)
WBC: 14 10*3/uL — ABNORMAL HIGH (ref 4.0–10.5)
nRBC: 0.1 % (ref 0.0–0.2)

## 2023-08-26 LAB — COMPREHENSIVE METABOLIC PANEL
ALT: 30 U/L (ref 0–44)
AST: 51 U/L — ABNORMAL HIGH (ref 15–41)
Albumin: 3.9 g/dL (ref 3.5–5.0)
Alkaline Phosphatase: 43 U/L (ref 38–126)
Anion gap: 15 (ref 5–15)
BUN: 59 mg/dL — ABNORMAL HIGH (ref 8–23)
CO2: 24 mmol/L (ref 22–32)
Calcium: 9.1 mg/dL (ref 8.9–10.3)
Chloride: 97 mmol/L — ABNORMAL LOW (ref 98–111)
Creatinine, Ser: 1.59 mg/dL — ABNORMAL HIGH (ref 0.44–1.00)
GFR, Estimated: 30 mL/min — ABNORMAL LOW (ref >60)
Glucose, Bld: 141 mg/dL — ABNORMAL HIGH (ref 70–99)
Potassium: 5.3 mmol/L — ABNORMAL HIGH (ref 3.5–5.1)
Sodium: 136 mmol/L (ref 135–145)
Total Bilirubin: 2 mg/dL — ABNORMAL HIGH (ref 0.3–1.2)
Total Protein: 6.8 g/dL (ref 6.5–8.1)

## 2023-08-26 LAB — TYPE AND SCREEN
ABO/RH(D): B POS
Antibody Screen: NEGATIVE

## 2023-08-26 MED ORDER — DOCUSATE SODIUM 100 MG PO CAPS
100.0000 mg | ORAL_CAPSULE | Freq: Two times a day (BID) | ORAL | Status: DC
  Administered 2023-08-27 – 2023-08-29 (×5): 100 mg via ORAL
  Filled 2023-08-26 (×6): qty 1

## 2023-08-26 MED ORDER — HYDROCODONE-ACETAMINOPHEN 5-325 MG PO TABS
1.0000 | ORAL_TABLET | Freq: Four times a day (QID) | ORAL | Status: DC | PRN
  Administered 2023-08-29: 2 via ORAL
  Filled 2023-08-26: qty 2

## 2023-08-26 MED ORDER — FENTANYL CITRATE PF 50 MCG/ML IJ SOSY
50.0000 ug | PREFILLED_SYRINGE | Freq: Once | INTRAMUSCULAR | Status: AC
  Administered 2023-08-26: 50 ug via INTRAVENOUS
  Filled 2023-08-26: qty 1

## 2023-08-26 MED ORDER — BISACODYL 5 MG PO TBEC: 5.0000 mg | DELAYED_RELEASE_TABLET | Freq: Every day | ORAL | Status: DC | PRN

## 2023-08-26 MED ORDER — ONDANSETRON HCL 4 MG/2ML IJ SOLN
4.0000 mg | Freq: Once | INTRAMUSCULAR | Status: AC
  Administered 2023-08-26: 4 mg via INTRAVENOUS
  Filled 2023-08-26: qty 2

## 2023-08-26 MED ORDER — METHOCARBAMOL 1000 MG/10ML IJ SOLN: 500.0000 mg | Freq: Four times a day (QID) | INTRAVENOUS | Status: DC | PRN

## 2023-08-26 MED ORDER — MORPHINE SULFATE (PF) 2 MG/ML IV SOLN: 2.0000 mg | INTRAVENOUS | Status: DC | PRN

## 2023-08-26 MED ORDER — METHOCARBAMOL 500 MG PO TABS
500.0000 mg | ORAL_TABLET | Freq: Four times a day (QID) | ORAL | Status: DC | PRN
  Administered 2023-08-28: 500 mg via ORAL
  Filled 2023-08-26: qty 1

## 2023-08-26 MED ORDER — POLYETHYLENE GLYCOL 3350 17 G PO PACK: 17.0000 g | PACK | Freq: Every day | ORAL | Status: DC | PRN

## 2023-08-26 MED ORDER — SODIUM CHLORIDE 0.9 % IV BOLUS
1000.0000 mL | Freq: Once | INTRAVENOUS | Status: AC
  Administered 2023-08-26: 1000 mL via INTRAVENOUS

## 2023-08-26 MED ORDER — SERTRALINE HCL 25 MG PO TABS
25.0000 mg | ORAL_TABLET | Freq: Every day | ORAL | Status: DC
  Administered 2023-08-26 – 2023-08-28 (×3): 25 mg via ORAL
  Filled 2023-08-26 (×3): qty 1

## 2023-08-26 NOTE — Consult Note (Signed)
ORTHOPEDIC CONSULTATION  Bayla Mcgovern [id number]  08/26/2023  CC:  Chief Complaint  Patient presents with   Fall    History of Present IlIness: The patient is a 89 y.o. female who reports on Monday, 08/22/2023 she got off balance at home and fell landing on her right hip.  She lives with her son who heard her fall and came to her aid.  She is not sure exactly what happened but it does not appear that she had any loss of consciousness.  The patient is a minimal ambulator around her home with her walker.  She was unable to ambulate but with her son's assistance, he was able to get her back to bed where she was comfortable.  He is continue to assist her but because of continued right hip pain and inability to ambulate she was transported to the local emergency department on 08/26/2023 where she was evaluated and found to have a right closed highly comminuted intertrochanteric fracture.  A review of her medical record reveals she has multiple significant medical comorbidities including congestive heart failure.  See admission history and physical examination for complete details.  PMH:  Past Medical History:  Diagnosis Date   Anxiety    Aortic heart valve narrowing    Atrial fibrillation (HCC)    Diabetes mellitus without complication (HCC)    Disorder of aorta (HCC)    Hyperlipidemia    Hypertension    Insomnia    Overactive bladder     SH:  Past Surgical History:  Procedure Laterality Date   APPENDECTOMY     rotator cuff surgery Left     ALL:  Allergies  Allergen Reactions   Lisinopril Cough    MED: (Not in a hospital admission)   All home medications have been reviewed as documented in the medication reconciliation portion of the patient record.  FH:  Family History  Problem Relation Age of Onset   Heart disease Father    Breast cancer Sister    Heart disease Brother    ADD / ADHD Daughter    Diabetes Son     Social:  reports that she has never smoked.  She has never used smokeless tobacco. She reports that she does not drink alcohol and does not use drugs.  Review of Systems: General: Denies fever, chills, weight loss Eyes: Denies blurry vision, changes in vision ENT: Denies sore throat, congestions, nosebleeds CV: Denies chest pain, palpitations Respiratory: Denies shortness of breath, wheezing, cough Gl: Denies abdominal pain, nausea, vomiting GU: Denies hematuria Integumentary: Denies rashes or lesions Neuro: Denies headache, dizziness Psych: Negative Hem/Onc: Denies easy bruising or bleeding disorders Musculoskeletal: See HPI above.  Vitals: BP 108/74   Pulse 89   Temp (!) 97 F (36.1 C)   Resp 19   Ht 5\' 1"  (1.549 m)   Wt 59 kg   LMP  (LMP Unknown)   SpO2 99%   BMI 24.56 kg/m    Physical Exam: General: Awake, alert and oriented, no acute distress. Eyes: Pupils reactive, EOMI, normal conjunctiva, no scleral icterus. HENT: Normocephalic, atraumatic, normal hearing, moist oral mucosa Neck: Supple, non-tender, no cervical lymphadenopathy. Lungs: Chest rise is symmetric, non-labored respiration, chest wall nontender to palpation Heart: Normal rate by palpation, normal peripheral perfusion Abdomen: Soft, non-tender, non-distended. Pelvis is stable. Skin: Skin envelope intact, dry and pink, no rashes or lesions, no signs of infection. Neurologic: Awake, alert, and oriented X3 Psychiatric: Cooperative, appropriate mood and affect.  Musculoskeletal: Evaluation of the patient's  bilateral upper extremities and left lower extremity reveal no areas of tenderness or limitations to range of motion. Any motion about the patient's right hip is extremely painful. Palpation of the right distal femur, knee, tib-fib region, ankle and foot show no areas of tenderness. The patient's calves are soft and nontender with no palpable cords. Homans testing is negative. Dorsalis pedis and posterior tibial pulse are 2+ and symmetric. The patient's  toes are pink and warm with a brisk capillary refill time. The patient is able to gently move their ankles and toes on command. Sensation is intact over all lower extremity dermatomal patterns.   Radiographic findings: Radiographic evaluation of the patient's pelvis and proximal right femur reveals a highly comminuted intertrochanteric fracture that extends to below the lesser trochanter.  The femoral shaft shows a great deal of demineralization at does the proximal head and neck region in keeping with her age, medical condition, and limited mobility.   Labs:  Recent Labs    08/26/23 1606  HGB 12.5   Recent Labs    08/26/23 1606  WBC 14.0*  RBC 4.75  HCT 38.6  PLT 217   Recent Labs    08/26/23 1606  NA 136  K 5.3*  CL 97*  CO2 24  BUN 59*  CREATININE 1.59*  GLUCOSE 141*  CALCIUM 9.1   No results for input(s): "LABPT", "INR" in the last 72 hours.   Assessment/Plan:    Assessment: 89 year old female status post ground-level fall 08/26/2023 with right closed highly comminuted intertrochanteric hip fracture with significant decrease in bone quality with multiple significant medical comorbidities.   Plan:  I discussed with the patient and her son who is present the severe nature of her fracture as well as the extremely poor bone quality.  At this stage she is undergoing further medical evaluation and will be admitted by the hospitalist service.  I discussed with the patient and her son that the #1 reason for surgical fixation of her right hip fracture would be pain control but I am greatly concerned that the risk of anesthesia and surgery as well as the concern of catastrophic failure of the implants because of poor bone quality may lead his to nonoperative treatment.  We discussed at this time we need to get her comfortable and admitted to the hospital and after her medical evaluation is complete we will further discuss their wishes to proceed with surgical intervention which I  would not be pushing for, or more conservative comfort care.  Cecil Cranker M.D. 08/26/2023 7:49 PM

## 2023-08-26 NOTE — Telephone Encounter (Signed)
Please let patient's family know that she will need an appointment for evaluation prior to getting an xray.

## 2023-08-26 NOTE — Hospital Course (Addendum)
Rt hip fracture AKI Hyperkalemia Mechanical fall. Mild ast elevation.  Tb of 2.0 Leucocytosis of 14.0

## 2023-08-26 NOTE — Assessment & Plan Note (Signed)
 -  Fall precaution

## 2023-08-26 NOTE — ED Notes (Signed)
Pt just awoke from a nap, pt arm repositioned to aid IV fluid flow. Call light within reach.

## 2023-08-26 NOTE — ED Provider Notes (Addendum)
[facility] Provider Note   Event Date/Time   First MD Initiated Contact with Patient 08/26/23 1528     (approximate) History  Fall  HPI Melinda Preston is a 89 y.o. female who presents after mechanical fall from standing 2 days prior to arrival who complains of right hip pain with significant medial thigh bruising.  Patient denies any head trauma or blood thinner use. ROS: Patient currently denies any vision changes, tinnitus, difficulty speaking, facial droop, sore throat, chest pain, shortness of breath, abdominal pain, nausea/vomiting/diarrhea, dysuria, or weakness/numbness/paresthesias in any extremity   Physical Exam  Triage Vital Signs: ED Triage Vitals  Encounter Vitals Group     BP 08/26/23 1520 90/67     Systolic BP Percentile --      Diastolic BP Percentile --      Pulse Rate 08/26/23 1520 84     Resp 08/26/23 1520 20     Temp 08/26/23 1519 (!) 97 F (36.1 C)     Temp src --      SpO2 08/26/23 1520 99 %     Weight 08/26/23 1509 130 lb (59 kg)     Height 08/26/23 1509 5\' 1"  (1.549 m)     Head Circumference --      Peak Flow --      Pain Score 08/26/23 1509 9     Pain Loc --      Pain Education --      Exclude from Growth Chart --    Most recent vital signs: Vitals:   08/26/23 2130 08/26/23 2230  BP: 102/76 (!) 105/57  Pulse: 84 73  Resp: 15 18  Temp:    SpO2:     General: Awake, oriented x4. CV:  Good peripheral perfusion.  Resp:  Normal effort.  Abd:  No distention.  Other:  Elderly well-developed, well-nourished Caucasian female resting in bed in moderate distress secondary to pain.  Right leg held in external rotation and shortened.  Distally neurovascularly intact.  There is a large area of ecchymosis to the right medial thigh.  There is tenderness to palpation over the right lateral hip and significant pain with range of motion at the right hip ED Results / Procedures / Treatments  Labs (all labs ordered are listed, but  only abnormal results are displayed) Labs Reviewed  COMPREHENSIVE METABOLIC PANEL - Abnormal; Notable for the following components:      Result Value   Potassium 5.3 (*)    Chloride 97 (*)    Glucose, Bld 141 (*)    BUN 59 (*)    Creatinine, Ser 1.59 (*)    AST 51 (*)    Total Bilirubin 2.0 (*)    GFR, Estimated 30 (*)    All other components within normal limits  CBC WITH DIFFERENTIAL/PLATELET - Abnormal; Notable for the following components:   WBC 14.0 (*)    Neutro Abs 11.5 (*)    Monocytes Absolute 1.4 (*)    Abs Immature Granulocytes 0.21 (*)    All other components within normal limits  CBC  BASIC METABOLIC PANEL  TYPE AND SCREEN   EKG ED ECG REPORT I, Merwyn Katos, the attending physician, personally viewed and interpreted this ECG. Date: 08/26/2023 EKG Time: 1535 Rate: 80 Rhythm: Atrial fibrillation QRS Axis: normal Intervals: normal ST/T Wave abnormalities: normal Narrative Interpretation: Atrial fibrillation.  No evidence of acute ischemia RADIOLOGY ED MD interpretation: 2 view x-ray of the right femur shows a complex intertrochanteric  fracture with displacement and foreshortening  One-view portable chest x-ray interpreted by me shows no evidence of acute abnormalities including no pneumonia, pneumothorax, or widened mediastinum -Agree with radiology assessment Official radiology report(s): DG FEMUR, MIN 2 VIEWS RIGHT  Result Date: 08/26/2023 CLINICAL DATA:  Fall 3 days ago. Pain and bruising in the right thigh from the hip to the knee. Right hip pain with external rotation. EXAM: RIGHT FEMUR 2 VIEWS COMPARISON:  None Available. FINDINGS: A complex intratrochanteric fracture is present. The fracture is foreshortened. The fracture fragment is displaced posteriorly. Fragments of the lesser and greater trochanter are mildly displaced. IMPRESSION: Complex intratrochanteric fracture with displacement and foreshortening. Electronically Signed   By: Marin Roberts M.D.   On: 08/26/2023 17:50   DG Chest 1 View  Result Date: 08/26/2023 CLINICAL DATA:  Fall.  Hip fracture. EXAM: CHEST  1 VIEW COMPARISON:  Two-view chest x-ray a 10/13/2016 FINDINGS: The heart is enlarged. Atherosclerotic calcifications are present at the aortic arch. A large hiatal hernia is again noted. Changes of COPD are present. No edema or effusion is present. Degenerative changes are present in the shoulders bilaterally. Rightward curvature of the thoracic spine is noted. IMPRESSION: 1. Cardiomegaly without failure. 2. Large hiatal hernia. 3. No acute cardiopulmonary disease. Electronically Signed   By: Marin Roberts M.D.   On: 08/26/2023 17:49   PROCEDURES: Critical Care performed: No .1-3 Lead EKG Interpretation  Performed by: Merwyn Katos, MD Authorized by: Merwyn Katos, MD     Interpretation: normal     ECG rate:  71   ECG rate assessment: normal     Rhythm: sinus rhythm     Ectopy: none     Conduction: normal    MEDICATIONS ORDERED IN ED: Medications  sertraline (ZOLOFT) tablet 25 mg (25 mg Oral Given 08/26/23 2151)  HYDROcodone-acetaminophen (NORCO/VICODIN) 5-325 MG per tablet 1-2 tablet (has no administration in time range)  morphine (PF) 2 MG/ML injection 2 mg (has no administration in time range)  methocarbamol (ROBAXIN) tablet 500 mg (has no administration in time range)    Or  methocarbamol (ROBAXIN) 500 mg in dextrose 5 % 50 mL IVPB (has no administration in time range)  docusate sodium (COLACE) capsule 100 mg (100 mg Oral Not Given 08/26/23 2151)  polyethylene glycol (MIRALAX / GLYCOLAX) packet 17 g (has no administration in time range)  bisacodyl (DULCOLAX) EC tablet 5 mg (has no administration in time range)  sodium chloride 0.9 % bolus 1,000 mL (0 mLs Intravenous Stopped 08/26/23 2138)  fentaNYL (SUBLIMAZE) injection 50 mcg (50 mcg Intravenous Given 08/26/23 1627)  ondansetron (ZOFRAN) injection 4 mg (4 mg Intravenous Given 08/26/23 1625)    IMPRESSION / MDM / ASSESSMENT AND PLAN / ED COURSE  I reviewed the triage vital signs and the nursing notes.                             The patient is on the cardiac monitor to evaluate for evidence of arrhythmia and/or significant heart rate changes. Patient's presentation is most consistent with acute presentation with potential threat to life or bodily function. Patient is a 89 year old female that presents for right hip pain Workup: XR hip Findings: Right intertrochanteric femur fracture without dislocation Consult: Orthopedic Surgery (query fascia iliacus block vs continued opiate pain control), hospitalist  Patient does not currently demonstrate complications of fracture such as compartment syndrome, arterial or nerve injury.  Interventions: analgesia Disposition: Admit  FINAL CLINICAL IMPRESSION(S) / ED DIAGNOSES   Final diagnoses:  Displaced intertrochanteric fracture of right femur, initial encounter for closed fracture (HCC)  Fall, initial encounter   Rx / DC Orders   ED Discharge Orders     None      Note:  This document was prepared using Dragon voice recognition software and may include unintentional dictation errors.   Merwyn Katos, MD 08/26/23 [id number]    Merwyn Katos, MD 08/26/23 [phone]

## 2023-08-26 NOTE — Assessment & Plan Note (Signed)
Suspect due to age and deconditioning. Fall precaution. Also could be from hypotension and or syncope.

## 2023-08-26 NOTE — Assessment & Plan Note (Signed)
Vitals:   08/26/23 1520 08/26/23 1700 08/26/23 1730 08/26/23 1800  BP: 90/67 114/70 106/63 116/65   08/26/23 1900 08/26/23 2130  BP: 108/74 102/76  We will hold her entresto and lasix held.

## 2023-08-26 NOTE — Assessment & Plan Note (Signed)
SpO2: 99 % on 2 L [place]. Suspect due to chf, however chest xray negative for any pleural effusion or PVC.  Will get  CTA / V/Q scan  to identify any VTE.

## 2023-08-26 NOTE — Telephone Encounter (Signed)
Called patient and son answered the phone he said that he just went ahead and brought her to the ER.

## 2023-08-26 NOTE — Assessment & Plan Note (Signed)
Poor surgical candidate and high risk due to her Cardiac history. Will keep pt npo and orthopedic to change once plan for sx or non sx repair is clarified with pt and family.

## 2023-08-26 NOTE — H&P (Signed)
History and Physical    Patient: Melinda Preston WUJ:[id number] DOB: [birth year] DOA: 08/26/2023 DOS: the patient was seen and examined on 08/26/2023 PCP: Marjie Skiff, NP  Patient coming from: Home   Chief Complaint:  Chief Complaint  Patient presents with   Fall    HPI: Melinda Preston is a 89 y.o. female with medical history significant for chronic A-fib, diabetes mellitus type 2, history of subdural hematoma, history of falls, hypertension, chronic congestive heart failure presenting with fall at home.  The fall was on Tuesday and she fell on her right side in the kitchen and is reported to be a mechanical fall although patient states she does not know how she fell she did not pass out.  She did not strike her head.  Since her fall she has been having right hip pain with limited mobility for obvious reasons and her son has been taking care of her and managing her at home.  Patient brought to the ER for continued pain and inability to ambulate and was found to have a right closed comminuted intertrochanteric fracture. At bedside patient is alert awake oriented son is at bedside and patient's O2 sats are dropping to 82, at that point I started patient on 2 L of nasal cannula patient does have a history of congestive heart failure.  She does report occasional shortness of breath but no worsening recently.  On physical exam she does not have any JVD or lower extreme edema does have some crackles at bases. Labs show mild hyperkalemia 5.3 glucose 141 AKI of 1.59 and EGFR of 30, AST of 51, total bili of 2.0 normal LFTs otherwise.  Leukocytosis of 14 hemoglobin of 12.5 and platelets of 217, EKG shows A-fib with left bundle branch block QTc of 510.   Review of Systems: Review of Systems  Respiratory:  Positive for shortness of breath.   Musculoskeletal:  Positive for falls and joint pain.  Endo/Heme/Allergies:  Bruises/bleeds easily.    Past Medical History:  Diagnosis Date    Anxiety    Aortic heart valve narrowing    Atrial fibrillation (HCC)    Diabetes mellitus without complication (HCC)    Disorder of aorta (HCC)    Hyperlipidemia    Hypertension    Insomnia    Overactive bladder    Past Surgical History:  Procedure Laterality Date   APPENDECTOMY     rotator cuff surgery Left    Social History:   reports that she has never smoked. She has never used smokeless tobacco. She reports that she does not drink alcohol and does not use drugs.  Allergies  Allergen Reactions   Lisinopril Cough    Family History  Problem Relation Age of Onset   Heart disease Father    Breast cancer Sister    Heart disease Brother    ADD / ADHD Daughter    Diabetes Son     Prior to Admission medications   Medication Sig Start Date End Date Taking? Authorizing Provider  atorvastatin (LIPITOR) 20 MG tablet TAKE 1 TABLET(20 MG) BY MOUTH AT BEDTIME 01/13/23   [name], Corrie Dandy T, NP  Calcium Carb-Cholecalciferol (CALCIUM 600+D3 PO) Take 1 tablet by mouth daily.     [provider]  carvedilol (COREG) 6.25 MG tablet Take 6.25 mg by mouth 2 (two) times daily. 06/27/22   [provider]  furosemide (LASIX) 40 MG tablet Take 1 tablet (40 mg total) by mouth 2 (two) times daily. 04/12/23   [name],  Jolene T, NP  Omega-3 Fatty Acids (FISH OIL) 1000 MG CAPS Take 1,000 mg by mouth daily.    [provider]  omeprazole (PRILOSEC) 20 MG capsule TAKE 1 CAPSULE(20 MG) BY MOUTH DAILY 01/13/23   [name], Jolene T, NP  potassium chloride SA (KLOR-CON M) 20 MEQ tablet Take 1 tablet (20 mEq total) by mouth daily. 01/13/23   [name], Corrie Dandy T, NP  sacubitril-valsartan (ENTRESTO) 24-26 MG Take 1 tablet by mouth every 12 (twelve) hours. 03/02/22   [provider]  sertraline (ZOLOFT) 25 MG tablet TAKE 1 TABLET(25 MG) BY MOUTH DAILY 01/13/23   [name], Corrie Dandy T, NP  traZODone (DESYREL) 50 MG tablet TAKE 1 TABLET(50 MG) BY MOUTH AT BEDTIME 01/13/23   [name], Jolene T,  NP  VITAMIN D, CHOLECALCIFEROL, PO Take 600 Units by mouth daily.     [provider]     Vitals:   08/26/23 1700 08/26/23 1730 08/26/23 1800 08/26/23 1900  BP: 114/70 106/63 116/65 108/74  Pulse:    89  Resp: 16 17 18 19   Temp:      SpO2:      Weight:      Height:       Physical Exam Vitals and nursing note reviewed.  Constitutional:      General: She is not in acute distress.    Interventions: Nasal cannula in place.  HENT:     Head: Normocephalic.     Right Ear: Hearing normal.     Left Ear: Hearing normal.     Nose: Nose normal. No nasal deformity.     Mouth/Throat:     Lips: Pink.     Tongue: No lesions.     Pharynx: Oropharynx is clear.  Eyes:     General: Lids are normal.     Extraocular Movements: Extraocular movements intact.  Cardiovascular:     Rate and Rhythm: Tachycardia present. Rhythm irregular.     Heart sounds: Normal heart sounds.  Pulmonary:     Effort: Pulmonary effort is normal.     Breath sounds: Examination of the right-lower field reveals rales. Examination of the left-lower field reveals rales. Rales present.  Abdominal:     General: Bowel sounds are normal. There is no distension.     Palpations: Abdomen is soft. There is no mass.     Tenderness: There is no abdominal tenderness.  Musculoskeletal:     Right hip: Deformity and tenderness present. No bony tenderness. Decreased range of motion.     Right lower leg: No edema.     Left lower leg: No edema.       Legs:     Comments: Ext rotated and large bruise on ant and inner thigh.   Skin:    General: Skin is warm.  Neurological:     General: No focal deficit present.     Mental Status: She is alert and oriented to person, place, and time.     Cranial Nerves: Cranial nerves 2-12 are intact.  Psychiatric:        Attention and Perception: Attention normal.        Mood and Affect: Mood normal.        Speech: Speech normal.        Behavior: Behavior normal. Behavior is  cooperative.     Labs on Admission: I have personally reviewed following labs and imaging studies.  CBC: Recent Labs  Lab 08/26/23 1606  WBC 14.0*  NEUTROABS 11.5*  HGB 12.5  HCT  38.6  MCV 81.3  PLT 217   Basic Metabolic Panel: Recent Labs  Lab 08/26/23 1606  NA 136  K 5.3*  CL 97*  CO2 24  GLUCOSE 141*  BUN 59*  CREATININE 1.59*  CALCIUM 9.1   GFR: Estimated Creatinine Clearance: 18.3 mL/min (A) (by C-G formula based on SCr of 1.59 mg/dL (H)). Liver Function Tests: Recent Labs  Lab 08/26/23 1606  AST 51*  ALT 30  ALKPHOS 43  BILITOT 2.0*  PROT 6.8  ALBUMIN 3.9   No results for input(s): "LIPASE", "AMYLASE" in the last 168 hours. No results for input(s): "AMMONIA" in the last 168 hours. Coagulation Profile: No results for input(s): "INR", "PROTIME" in the last 168 hours. Cardiac Enzymes: No results for input(s): "CKTOTAL", "CKMB", "CKMBINDEX", "TROPONINI" in the last 168 hours. BNP (last 3 results) No results for input(s): "PROBNP" in the last 8760 hours. HbA1C: No results for input(s): "HGBA1C" in the last 72 hours. CBG: No results for input(s): "GLUCAP" in the last 168 hours. Lipid Profile: No results for input(s): "CHOL", "HDL", "LDLCALC", "TRIG", "CHOLHDL", "LDLDIRECT" in the last 72 hours. Thyroid Function Tests: No results for input(s): "TSH", "T4TOTAL", "FREET4", "T3FREE", "THYROIDAB" in the last 72 hours. Anemia Panel: No results for input(s): "VITAMINB12", "FOLATE", "FERRITIN", "TIBC", "IRON", "RETICCTPCT" in the last 72 hours. Urinalysis No results found for: "COLORURINE", "APPEARANCEUR", "LABSPEC", "PHURINE", "GLUCOSEU", "HGBUR", "BILIRUBINUR", "KETONESUR", "PROTEINUR", "UROBILINOGEN", "NITRITE", "LEUKOCYTESUR"  Unresulted Labs (From admission, onward)     Start     Ordered   08/27/23 0500  CBC  Tomorrow morning,   R        08/26/23 1858   08/27/23 0500  Basic metabolic panel  Tomorrow morning,   R        08/26/23 1858            Medications  sertraline (ZOLOFT) tablet 25 mg (has no administration in time range)  HYDROcodone-acetaminophen (NORCO/VICODIN) 5-325 MG per tablet 1-2 tablet (has no administration in time range)  morphine (PF) 2 MG/ML injection 2 mg (has no administration in time range)  methocarbamol (ROBAXIN) tablet 500 mg (has no administration in time range)    Or  methocarbamol (ROBAXIN) 500 mg in dextrose 5 % 50 mL IVPB (has no administration in time range)  docusate sodium (COLACE) capsule 100 mg (has no administration in time range)  polyethylene glycol (MIRALAX / GLYCOLAX) packet 17 g (has no administration in time range)  bisacodyl (DULCOLAX) EC tablet 5 mg (has no administration in time range)  sodium chloride 0.9 % bolus 1,000 mL (1,000 mLs Intravenous New Bag/Given 08/26/23 1629)  fentaNYL (SUBLIMAZE) injection 50 mcg (50 mcg Intravenous Given 08/26/23 1627)  ondansetron (ZOFRAN) injection 4 mg (4 mg Intravenous Given 08/26/23 1625)   Radiological Exams on Admission: DG FEMUR, MIN 2 VIEWS RIGHT  Result Date: 08/26/2023 CLINICAL DATA:  Fall 3 days ago. Pain and bruising in the right thigh from the hip to the knee. Right hip pain with external rotation. EXAM: RIGHT FEMUR 2 VIEWS COMPARISON:  None Available. FINDINGS: A complex intratrochanteric fracture is present. The fracture is foreshortened. The fracture fragment is displaced posteriorly. Fragments of the lesser and greater trochanter are mildly displaced. IMPRESSION: Complex intratrochanteric fracture with displacement and foreshortening. Electronically Signed   By: Marin Roberts M.D.   On: 08/26/2023 17:50   DG Chest 1 View  Result Date: 08/26/2023 CLINICAL DATA:  Fall.  Hip fracture. EXAM: CHEST  1 VIEW COMPARISON:  Two-view chest x-ray a 10/13/2016 FINDINGS:  The heart is enlarged. Atherosclerotic calcifications are present at the aortic arch. A large hiatal hernia is again noted. Changes of COPD are present. No edema or effusion  is present. Degenerative changes are present in the shoulders bilaterally. Rightward curvature of the thoracic spine is noted. IMPRESSION: 1. Cardiomegaly without failure. 2. Large hiatal hernia. 3. No acute cardiopulmonary disease. Electronically Signed   By: Marin Roberts M.D.   On: 08/26/2023 17:49    Data Reviewed: Relevant notes from primary care and specialist visits, past discharge summaries as available in EHR, including Care Everywhere. Prior diagnostic testing as pertinent to current admission diagnoses Updated medications and problem lists for reconciliation ED course, including vitals, labs, imaging, treatment and response to treatment Triage notes, nursing and pharmacy notes and ED provider's notes Notable results as noted in HPI. Assessment & Plan Fall at home, initial encounter Suspect due to age and deconditioning. Fall precaution. Also could be from hypotension and or syncope.  Closed right hip fracture, initial encounter [facility]) Poor surgical candidate and high risk due to her Cardiac history. Will keep pt npo and orthopedic to change once plan for sx or non sx repair is clarified with pt and family.   Acute respiratory failure with hypoxia (HCC) SpO2: 99 % on 2 L [place]. Suspect due to chf, however chest xray negative for any pleural effusion or PVC.  Will get  CTA / V/Q scan  to identify any VTE.   Chronic systolic CHF (congestive heart failure) (HCC) Pt follows with Dr. Darrold Junker. Strict I/O. Essential hypertension Vitals:   08/26/23 1520 08/26/23 1700 08/26/23 1730 08/26/23 1800  BP: 90/67 114/70 106/63 116/65   08/26/23 1900 08/26/23 2130  BP: 108/74 102/76  We will hold her entresto and lasix held. Chronic atrial fibrillation (HCC) Off anticoagulation suspect 2nd to SDH history and risk of falls.  GERD without esophagitis IV PPI. History of subdural hematoma 03/28/20 Fall precaution.    DVT prophylaxis:  SCD's  Consults:  Orthopedic: Syncope Collapse.    Advance Care Planning:    Code Status: Full Code   Family Communication:  None   Disposition Plan:  SNF.  Severity of Illness: The appropriate patient status for this patient is INPATIENT. Inpatient status is judged to be reasonable and necessary in order to provide the required intensity of service to ensure the patient's safety. The patient's presenting symptoms, physical exam findings, and initial radiographic and laboratory data in the context of their chronic comorbidities is felt to place them at high risk for further clinical deterioration. Furthermore, it is not anticipated that the patient will be medically stable for discharge from the hospital within 2 midnights of admission.   * I certify that at the point of admission it is my clinical judgment that the patient will require inpatient hospital care spanning beyond 2 midnights from the point of admission due to high intensity of service, high risk for further deterioration and high frequency of surveillance required.*  Author: Gertha Calkin, MD 08/26/2023 9:35 PM  For on call review [email or web address].

## 2023-08-26 NOTE — ED Notes (Signed)
Pt to Xray.

## 2023-08-26 NOTE — Assessment & Plan Note (Signed)
Pt follows with Dr. Darrold Junker. Strict I/O.

## 2023-08-26 NOTE — Assessment & Plan Note (Signed)
Off anticoagulation suspect 2nd to SDH history and risk of falls.

## 2023-08-26 NOTE — Telephone Encounter (Signed)
Pt's son is calling in because pt fell on Tuesday and the insides of her legs are purple and black. Pt's son would like a referral for pt to have an Xray. Offered pt an appointment and they declined stating they only needed a referral for an Xray because of her legs and they did not want to go to the hospital. Please follow up with Jillyn Hidden.

## 2023-08-26 NOTE — Telephone Encounter (Signed)
Noted  

## 2023-08-26 NOTE — ED Triage Notes (Addendum)
Pt via POV from home. Pt states she was cooking dinner on Tuesday and had a mechanical fall, states her leg just give out on her. Pt has significant bruising on her R inner thighs. Pt also c/o R leg pain, all the way down. Swelling to the R thigh, and outward rotation. Denies head injury. Denies LOC. Denies blood thinner. Pedal pulses present in the R foot. Pt is A&Ox4 and NAD

## 2023-08-26 NOTE — Assessment & Plan Note (Signed)
IV PPI. 

## 2023-08-26 NOTE — ED Notes (Signed)
Pt given warm blanket, call light within reach.

## 2023-08-26 NOTE — ED Notes (Signed)
Warm blanket provided.

## 2023-08-27 ENCOUNTER — Inpatient Hospital Stay: Payer: Medicare Other

## 2023-08-27 DIAGNOSIS — S72001A Fracture of unspecified part of neck of right femur, initial encounter for closed fracture: Secondary | ICD-10-CM

## 2023-08-27 DIAGNOSIS — N179 Acute kidney failure, unspecified: Secondary | ICD-10-CM | POA: Diagnosis present

## 2023-08-27 DIAGNOSIS — Y92009 Unspecified place in unspecified non-institutional (private) residence as the place of occurrence of the external cause: Secondary | ICD-10-CM | POA: Diagnosis not present

## 2023-08-27 DIAGNOSIS — E875 Hyperkalemia: Secondary | ICD-10-CM | POA: Diagnosis present

## 2023-08-27 DIAGNOSIS — S065XAA Traumatic subdural hemorrhage with loss of consciousness status unknown, initial encounter: Secondary | ICD-10-CM

## 2023-08-27 DIAGNOSIS — W19XXXA Unspecified fall, initial encounter: Secondary | ICD-10-CM | POA: Diagnosis not present

## 2023-08-27 LAB — BASIC METABOLIC PANEL
Anion gap: 11 (ref 5–15)
BUN: 60 mg/dL — ABNORMAL HIGH (ref 8–23)
CO2: 25 mmol/L (ref 22–32)
Calcium: 8.2 mg/dL — ABNORMAL LOW (ref 8.9–10.3)
Chloride: 104 mmol/L (ref 98–111)
Creatinine, Ser: 1.42 mg/dL — ABNORMAL HIGH (ref 0.44–1.00)
GFR, Estimated: 34 mL/min — ABNORMAL LOW (ref >60)
Glucose, Bld: 107 mg/dL — ABNORMAL HIGH (ref 70–99)
Potassium: 4 mmol/L (ref 3.5–5.1)
Sodium: 140 mmol/L (ref 135–145)

## 2023-08-27 LAB — CBC
HCT: 29.7 % — ABNORMAL LOW (ref 36.0–46.0)
Hemoglobin: 9.8 g/dL — ABNORMAL LOW (ref 12.0–15.0)
MCH: 26.8 pg (ref 26.0–34.0)
MCHC: 33 g/dL (ref 30.0–36.0)
MCV: 81.1 fL (ref 80.0–100.0)
Platelets: 178 10*3/uL (ref 150–400)
RBC: 3.66 MIL/uL — ABNORMAL LOW (ref 3.87–5.11)
RDW: 14.1 % (ref 11.5–15.5)
WBC: 11.3 10*3/uL — ABNORMAL HIGH (ref 4.0–10.5)
nRBC: 0.2 % (ref 0.0–0.2)

## 2023-08-27 MED ORDER — ATORVASTATIN CALCIUM 20 MG PO TABS
20.0000 mg | ORAL_TABLET | Freq: Every day | ORAL | Status: DC
  Administered 2023-08-27 – 2023-08-28 (×2): 20 mg via ORAL
  Filled 2023-08-27 (×2): qty 1

## 2023-08-27 MED ORDER — TRAZODONE HCL 50 MG PO TABS
50.0000 mg | ORAL_TABLET | Freq: Every day | ORAL | Status: DC
  Administered 2023-08-27 – 2023-08-28 (×2): 50 mg via ORAL
  Filled 2023-08-27 (×2): qty 1

## 2023-08-27 NOTE — Plan of Care (Signed)

## 2023-08-27 NOTE — Progress Notes (Signed)
ORTHOPEDIC PROGRESS NOTE  SUBJECTIVE:     The patient is resting quietly in bed.  The patient reportedly overnight had some increased confusion but was resting quietly and not attempting to get out of bed or removal IVs etc.  Her son is not present this morning for further discussions of orthopedic management for her hip fracture.  I attempted to call the son, Ennis Forts and also Dwain Sarna but no one answered.  Patient is currently being moved from the emergency department to her room.  OBJECTIVE:  Vitals:   08/27/23 0811 08/27/23 0830  BP: 105/71 123/80  Pulse: 86   Resp: 17   Temp: 97.9 F (36.6 C)   SpO2: 97%     The patient's dressing is clean, dry, and intact. The patient's bilateral lower extremities are neurovascularly intact.  Their toes are pink and warm with brisk capillary refill time.  Dorsalis pedis and posterior tibial pulse are 2+ and symmetric. The patient's calves are soft, nontender, with a negative Homans test bilaterally.  The bruising and tenderness around the patient's right hip is unchanged and stable.  Labs:  Recent Labs    08/26/23 1606 08/27/23 0436  HGB 12.5 9.8*   Recent Labs    08/26/23 1606 08/27/23 0436  WBC 14.0* 11.3*  RBC 4.75 3.66*  HCT 38.6 29.7*  PLT 217 178   Recent Labs    08/26/23 1606 08/27/23 0436  NA 136 140  K 5.3* 4.0  CL 97* 104  CO2 24 25  BUN 59* 60*  CREATININE 1.59* 1.42*  GLUCOSE 141* 107*  CALCIUM 9.1 8.2*   No results for input(s): "LABPT", "INR" in the last 72 hours.    ASSESSMENT:  Assessment: 89 year old female status post ground-level fall 08/26/2023 with right closed highly comminuted intertrochanteric hip fracture with significant decrease in bone quality with multiple significant medical comorbidities    Patient is comfortable with no complaints of pain.  No family members present or available by telephone.   PLAN:  Continue DVT prophylaxis. Discharge planning for subacute rehab  placement/skilled nursing facility.  I discussed with the patient that based on her significant cardiac history, severely comminuted fracture with very poor bone quality I would recommend comfort care at this stage.  I will continue to try to have further family discussions with her sons by telephone or in person.  No surgery is planned at this time so we will continue DVT prophylaxis and provide the patient a heart healthy diet.  Discussed patient's orthopedic care with the hospitalist and will continue to follow along with you.   Cecil Cranker M.D. 08/27/2023 10:35 AM

## 2023-08-27 NOTE — Progress Notes (Addendum)
I was informed by the radiologist that CT head for Melinda Preston showed acute left hemispheric subdural hematoma up to 3 mm in size but without midline shift or mass effect. I consulted Dr. Emogene Morgan, neurosurgeon. He recommended repeat CT head 6 hours from the initial CT head.  No pharmacologic DVT prophylaxis for now.  I called Mickey (at 8:35 pm) and Jillyn Hidden (at 8:38 pm) but there was no response.

## 2023-08-27 NOTE — Progress Notes (Addendum)
Progress Note    Melinda Preston  ZOX:[id number] DOB: 08-29-1928  DOA: 08/26/2023 PCP: Marjie Skiff, NP      Brief Narrative:    Medical records reviewed and are as summarized below:  Melinda Preston is a 89 y.o. female with medical history significant for permanent atrial fibrillation, hydrodilatation, hypertension, hyperlipidemia, nonischemic dilated cardiomyopathy, chronic systolic CHF (EF 81%), left bundle branch block, frequent falls, history of subdural hematoma and no longer on long-term anticoagulation.  She presented to the hospital because of severe pain in the right hip following a mechanical fall at home.  Reportedly she fell on Monday, 08/22/2023.  She did not present to the hospital until 08/26/2023 when her pain got worse.  She was found to have complex right intratrochanteric fracture with displacement and foreshortening.    Assessment/Plan:   Principal Problem:   Fall at home, initial encounter Active Problems:   Closed right hip fracture, initial encounter (HCC)   Acute respiratory failure with hypoxia (HCC)   Chronic systolic CHF (congestive heart failure) (HCC)   Essential hypertension   Chronic atrial fibrillation (HCC)   GERD without esophagitis   History of subdural hematoma 03/28/20    Body mass index is 24.56 kg/m.   Right closed comminuted intertrochanteric hip fracture s/p fall at home: Analgesics as needed for pain.  She has been evaluated by Dr. Rosann Auerbach, orthopedic surgeon.  He recommended conservative management and comfort care at this time.   AKI: Creatinine was 1.59 on admission.  Hold Lasix and Entresto. Baseline creatinine has mostly been below 0.9.  She was given 1 L of normal saline in the ED.  I will be cautious with IV fluids because of advanced age and underlying systolic CHF.  Monitor BMP.  Encourage adequate oral hydration.   Hyperkalemia: Improved.  Hold potassium supplement.   Confusion, suspected  delirium: Continue supportive care.   Chronic systolic CHF: Compensated EF 35% on 2D echo from May 2023   Permanent atrial fibrillation: No longer on anticoagulation because of frequent falls and history of subdural hematoma   Leukocytosis improving.  This is likely reactive   Doubt acute hypoxemic respiratory failure at this time.  She is tolerating room air   Other comorbidities include advanced age, history of subdural hematoma, hypertension, GERD, large hiatal hernia   I called her two sons, Jillyn Hidden and Lenard Galloway, but there was no response.  Diet Order             Diet Heart Room service appropriate? Yes; Fluid consistency: Thin  Diet effective now                            Consultants: Orthopedic surgeon  Procedures: None    Medications:    docusate sodium  100 mg Oral BID   sertraline  25 mg Oral QHS   Continuous Infusions:  methocarbamol (ROBAXIN) IV       Anti-infectives (From admission, onward)    None              Family Communication/Anticipated D/C date and plan/Code Status   DVT prophylaxis: SCDs Start: 08/26/23 1858     Code Status: Full Code  Family Communication: None Disposition Plan: To be determined   Status is: Inpatient Remains inpatient appropriate because: Complex right hip fracture       Subjective:   Overall events noted.  She is confused and cannot provide any history.  She  could not even tell me her name.  Objective:    Vitals:   08/27/23 0400 08/27/23 0811 08/27/23 0830 08/27/23 1049  BP: (!) 97/54 105/71 123/80 105/76  Pulse: 83 86  98  Resp: 14 17  17   Temp:  97.9 F (36.6 C)  97.8 F (36.6 C)  TempSrc:  Oral    SpO2: 96% 97%  (!) 89%  Weight:      Height:       No data found.   Intake/Output Summary (Last 24 hours) at 08/27/2023 1151 Last data filed at 08/26/2023 2138 Gross per 24 hour  Intake 1000 ml  Output --  Net 1000 ml   Filed Weights   08/26/23 1509  Weight: 59  kg    Exam:  GEN: NAD SKIN: Warm and dry EYES: No pallor or icterus ENT: MMM CV: RRR PULM: CTA B ABD: soft, ND, NT, +BS CNS: Alert but disoriented, non focal EXT: Swelling and tenderness of right thigh.  Bruising/ecchymosis on medial aspect of right thigh.  Right lower extremity is shorter than the left.  Right lower extremity is externally rotated.        Data Reviewed:   I have personally reviewed following labs and imaging studies:  Labs: Labs show the following:   Basic Metabolic Panel: Recent Labs  Lab 08/26/23 1606 08/27/23 0436  NA 136 140  K 5.3* 4.0  CL 97* 104  CO2 24 25  GLUCOSE 141* 107*  BUN 59* 60*  CREATININE 1.59* 1.42*  CALCIUM 9.1 8.2*   GFR Estimated Creatinine Clearance: 20.4 mL/min (A) (by C-G formula based on SCr of 1.42 mg/dL (H)). Liver Function Tests: Recent Labs  Lab 08/26/23 1606  AST 51*  ALT 30  ALKPHOS 43  BILITOT 2.0*  PROT 6.8  ALBUMIN 3.9   No results for input(s): "LIPASE", "AMYLASE" in the last 168 hours. No results for input(s): "AMMONIA" in the last 168 hours. Coagulation profile No results for input(s): "INR", "PROTIME" in the last 168 hours.  CBC: Recent Labs  Lab 08/26/23 1606 08/27/23 0436  WBC 14.0* 11.3*  NEUTROABS 11.5*  --   HGB 12.5 9.8*  HCT 38.6 29.7*  MCV 81.3 81.1  PLT 217 178   Cardiac Enzymes: No results for input(s): "CKTOTAL", "CKMB", "CKMBINDEX", "TROPONINI" in the last 168 hours. BNP (last 3 results) No results for input(s): "PROBNP" in the last 8760 hours. CBG: No results for input(s): "GLUCAP" in the last 168 hours. D-Dimer: No results for input(s): "DDIMER" in the last 72 hours. Hgb A1c: No results for input(s): "HGBA1C" in the last 72 hours. Lipid Profile: No results for input(s): "CHOL", "HDL", "LDLCALC", "TRIG", "CHOLHDL", "LDLDIRECT" in the last 72 hours. Thyroid function studies: No results for input(s): "TSH", "T4TOTAL", "T3FREE", "THYROIDAB" in the last 72  hours.  Invalid input(s): "FREET3" Anemia work up: No results for input(s): "VITAMINB12", "FOLATE", "FERRITIN", "TIBC", "IRON", "RETICCTPCT" in the last 72 hours. Sepsis Labs: Recent Labs  Lab 08/26/23 1606 08/27/23 0436  WBC 14.0* 11.3*    Microbiology No results found for this or any previous visit (from the past 240 hour(s)).  Procedures and diagnostic studies:  DG FEMUR, MIN 2 VIEWS RIGHT  Result Date: 08/26/2023 CLINICAL DATA:  Fall 3 days ago. Pain and bruising in the right thigh from the hip to the knee. Right hip pain with external rotation. EXAM: RIGHT FEMUR 2 VIEWS COMPARISON:  None Available. FINDINGS: A complex intratrochanteric fracture is present. The fracture is foreshortened. The fracture fragment  is displaced posteriorly. Fragments of the lesser and greater trochanter are mildly displaced. IMPRESSION: Complex intratrochanteric fracture with displacement and foreshortening. Electronically Signed   By: Marin Roberts M.D.   On: 08/26/2023 17:50   DG Chest 1 View  Result Date: 08/26/2023 CLINICAL DATA:  Fall.  Hip fracture. EXAM: CHEST  1 VIEW COMPARISON:  Two-view chest x-ray a 10/13/2016 FINDINGS: The heart is enlarged. Atherosclerotic calcifications are present at the aortic arch. A large hiatal hernia is again noted. Changes of COPD are present. No edema or effusion is present. Degenerative changes are present in the shoulders bilaterally. Rightward curvature of the thoracic spine is noted. IMPRESSION: 1. Cardiomegaly without failure. 2. Large hiatal hernia. 3. No acute cardiopulmonary disease. Electronically Signed   By: Marin Roberts M.D.   On: 08/26/2023 17:49               LOS: 1 day   Melinda Preston  [facility]   Pager on [email or web address]. If 7PM-7AM, please contact night-coverage at [email or web address]     08/27/2023, 11:51 AM

## 2023-08-28 ENCOUNTER — Inpatient Hospital Stay: Payer: Medicare Other

## 2023-08-28 DIAGNOSIS — S065XAA Traumatic subdural hemorrhage with loss of consciousness status unknown, initial encounter: Secondary | ICD-10-CM | POA: Diagnosis not present

## 2023-08-28 DIAGNOSIS — S72001A Fracture of unspecified part of neck of right femur, initial encounter for closed fracture: Secondary | ICD-10-CM | POA: Diagnosis not present

## 2023-08-28 DIAGNOSIS — W19XXXA Unspecified fall, initial encounter: Secondary | ICD-10-CM | POA: Diagnosis not present

## 2023-08-28 DIAGNOSIS — Y92009 Unspecified place in unspecified non-institutional (private) residence as the place of occurrence of the external cause: Secondary | ICD-10-CM | POA: Diagnosis not present

## 2023-08-28 LAB — BASIC METABOLIC PANEL
Anion gap: 11 (ref 5–15)
BUN: 49 mg/dL — ABNORMAL HIGH (ref 8–23)
CO2: 25 mmol/L (ref 22–32)
Calcium: 8.4 mg/dL — ABNORMAL LOW (ref 8.9–10.3)
Chloride: 99 mmol/L (ref 98–111)
Creatinine, Ser: 1.07 mg/dL — ABNORMAL HIGH (ref 0.44–1.00)
GFR, Estimated: 48 mL/min — ABNORMAL LOW (ref >60)
Glucose, Bld: 116 mg/dL — ABNORMAL HIGH (ref 70–99)
Potassium: 3.7 mmol/L (ref 3.5–5.1)
Sodium: 135 mmol/L (ref 135–145)

## 2023-08-28 LAB — CBC
HCT: 33.1 % — ABNORMAL LOW (ref 36.0–46.0)
Hemoglobin: 11 g/dL — ABNORMAL LOW (ref 12.0–15.0)
MCH: 26.9 pg (ref 26.0–34.0)
MCHC: 33.2 g/dL (ref 30.0–36.0)
MCV: 80.9 fL (ref 80.0–100.0)
Platelets: 224 10*3/uL (ref 150–400)
RBC: 4.09 MIL/uL (ref 3.87–5.11)
RDW: 14 % (ref 11.5–15.5)
WBC: 12.7 10*3/uL — ABNORMAL HIGH (ref 4.0–10.5)
nRBC: 0 % (ref 0.0–0.2)

## 2023-08-28 MED ORDER — ENSURE ENLIVE PO LIQD
237.0000 mL | Freq: Two times a day (BID) | ORAL | Status: DC
  Administered 2023-08-28 – 2023-08-29 (×2): 237 mL via ORAL

## 2023-08-28 NOTE — Progress Notes (Addendum)
[facility] Liaison Note  Received request from [facility], [place] , Transitions of Care Manager, to speak with the family about [facility]. Per the family request, a referral was submitted to have patient evaluated for the [facility]. A short time afterwards, the family decided that they wanted to take patient home with Hospice services.   Spoke with son, Dwain Sarna, to initiate education related to hospice philosophy, services, and team approach to care as it relates to Hospice services at home. Son verbalized understanding of information given.  Per discussion, the plan is for discharge home by EMS once medically stable.  DME needs discussed.  Patient has the following equipment in the home: Dan Humphreys and [facility] that is owned  Patient/family requests the following equipment in the home: Hospital Bed and over the bed table.   The address has been verified.  Patient will discharge to her son, Gary's home at [address], [place], [place] [address].  Jillyn Hidden can be reached at [phone] or [phone] to coordinate delivery of the DME.    Please send signed and completed DNR home with the patient/family.  Please provide prescriptions at discharge as needed to ensure ongoing symptom management.   [facility] information and contact numbers given to the son.  Above information shared with Alfonso Ramus, LCSW, Transitions of Care Manager.     Please call with any Hospice related questions or concerns.  Thank you for the opportunity to participate in this patient's care.  Redge Gainer, [facility] Liaison [phone]

## 2023-08-28 NOTE — Discharge Instructions (Signed)
ORTHOPEDIC DISCHARGE INSTRUCTIONS  Follow Up Appointment:  Follow-up in the office in 10-14 days.  Please contact the [facility] at [phone]  to schedule your follow-up appointment.  Dressing and cast care instructions:  No dressing is needed.  Weight-bearing status:  You are weightbearing as tolerated on the RIGHT LOWER EXTREMITY with your walker/crutches.   Diet:   You may resume your regular diet as tolerated.  Begin with clear liquids and slowly advance to your normal diet.  Medications:   Take your medicines as prescribed. You may have written prescriptions or your prescriptions may have been E-prescribed to your pharmacy. If you were given prescriptions for any blood thinners, it is a very important that you take these medications as prescribed to prevent blood clots.  General instructions:  Elevate the affected extremity to control pain and swelling. Apply ice packs to the affected area to control pain and swelling.  Surgery and pain medications may lead to constipation. It is easier to prevent this than it is to treat it. We recommend MiraLAX or Senna/Senakot for laxatives and Colace as a stool softener as needed after surgery. Drink plenty of fluids to stay well-hydrated. Consult your local pharmacist for other treatment recommendations.   Please perform deep breathing exercises every hour to increase the airflow in your lungs which could predispose you to running a low-grade fever and increase your risk of pneumonia.

## 2023-08-28 NOTE — Plan of Care (Signed)
  Problem: Education: Goal: Knowledge of General Education information will improve Description Including pain rating scale, medication(s)/side effects and non-pharmacologic comfort measures Outcome: Progressing   

## 2023-08-28 NOTE — Plan of Care (Signed)
Problem: Clinical Measurements: Goal: Will remain free from infection Outcome: Progressing Goal: Respiratory complications will improve Outcome: Progressing Goal: Cardiovascular complication will be avoided Outcome: Progressing   Problem: Nutrition: Goal: Adequate nutrition will be maintained Outcome: Progressing   Problem: Coping: Goal: Level of anxiety will decrease Outcome: Progressing   Problem: Elimination: Goal: Will not experience complications related to bowel motility Outcome: Progressing Goal: Will not experience complications related to urinary retention Outcome: Progressing   Problem: Pain Managment: Goal: General experience of comfort will improve Outcome: Progressing   Problem: Safety: Goal: Ability to remain free from injury will improve Outcome: Progressing

## 2023-08-28 NOTE — Progress Notes (Addendum)
Progress Note    Adalie Hanberry  QIO:[id number] DOB: [birth year]  DOA: 08/26/2023 PCP: Marjie Skiff, NP      Brief Narrative:    Medical records reviewed and are as summarized below:  Melinda Preston is a 89 y.o. female with medical history significant for permanent atrial fibrillation, hydrodilatation, hypertension, hyperlipidemia, nonischemic dilated cardiomyopathy, chronic systolic CHF (EF 32%), left bundle branch block, frequent falls, history of subdural hematoma and no longer on long-term anticoagulation.  She presented to the hospital because of severe pain in the right hip following a mechanical fall at home.  Reportedly she fell on Monday, 08/22/2023.  She did not present to the hospital until 08/26/2023 when her pain got worse.  She was found to have complex right intratrochanteric fracture with displacement and foreshortening.    Assessment/Plan:   Principal Problem:   Fall at home, initial encounter Active Problems:   Closed right hip fracture, initial encounter (HCC)   Acute respiratory failure with hypoxia (HCC)   Chronic systolic CHF (congestive heart failure) (HCC)   Essential hypertension   Chronic atrial fibrillation (HCC)   GERD without esophagitis   History of subdural hematoma 03/28/20   AKI (acute kidney injury) (HCC)   Hyperkalemia   Acute subdural hematoma (HCC)    Body mass index is 24.56 kg/m.   Right closed comminuted intertrochanteric hip fracture s/p fall at home:  Analgesics as needed for pain.  Case was discussed with Dr. Rosann Auerbach, orthopedic surgeon, again today.  He had spoken with Dr. Ernest Pine, orthopedic surgeon.  Both of them do not recommend surgery at this time.  Lenard Galloway (son) Olegario Messier and (daughter-in-law) have requested comfort care.  Hospice team has been consulted to assist with disposition.   Acute left subdural hematoma without significant mass effect or midline shift: Repeat CT head did not show any  significant change in size of hematoma. Family understands that pharmacologic DVT prophylaxis will be avoided because of acute subdural hematoma.  They also understand that patient is at very high risk for DVT or pulmonary embolism because of right hip fracture.   AKI: Improved from 1.59-1.07.   Hold Lasix and Entresto. Baseline creatinine has mostly been below 0.9.     Hyperkalemia: Improved.  Hold potassium supplement.   Confusion, suspected delirium: Continue supportive care.   Chronic systolic CHF: Compensated EF 35% on 2D echo from May 2023   Permanent atrial fibrillation: No longer on anticoagulation because of frequent falls and history of subdural hematoma   Leukocytosis improving.  This is likely reactive   Doubt acute hypoxemic respiratory failure at this time.  She is tolerating room air   Other comorbidities include advanced age, history of subdural hematoma, hypertension, GERD, large hiatal hernia. Family suspect she has underlying dementia though she has not been formally diagnosed with dementia.     Diet Order             Diet regular Room service appropriate? Yes; Fluid consistency: Thin  Diet effective now                            Consultants: Orthopedic surgeon  Procedures: None    Medications:    atorvastatin  20 mg Oral QHS   docusate sodium  100 mg Oral BID   feeding supplement  237 mL Oral BID BM   sertraline  25 mg Oral QHS   traZODone  50 mg Oral QHS  Continuous Infusions:  methocarbamol (ROBAXIN) IV       Anti-infectives (From admission, onward)    None              Family Communication/Anticipated D/C date and plan/Code Status   DVT prophylaxis: Place and maintain sequential compression device Start: 08/27/23 2109 SCDs Start: 08/26/23 1858     Code Status: Full Code  Family Communication: Plan discussed with Lenard Galloway (son) and Olegario Messier (daughter-in-law) at the bedside. Disposition Plan: To be  determined   Status is: Inpatient Remains inpatient appropriate because: Complex right hip fracture       Subjective:   Interval events noted.  She is drowsy and confused and cannot provide any history.  Lenard Galloway (son) and Olegario Messier (daughter-in-law) were at the bedside.    Objective:    Vitals:   08/27/23 2028 08/27/23 2355 08/28/23 0310 08/28/23 0737  BP: 97/63 (!) 86/56 (!) 93/57 110/64  Pulse: 96 78 75 80  Resp: 16  16 16   Temp: 98.5 F (36.9 C) 99 F (37.2 C) 98.7 F (37.1 C) 97.6 F (36.4 C)  TempSrc:  Oral    SpO2: 94% 96% 95% 93%  Weight:      Height:       No data found.   Intake/Output Summary (Last 24 hours) at 08/28/2023 1216 Last data filed at 08/28/2023 1145 Gross per 24 hour  Intake 240 ml  Output 300 ml  Net -60 ml   Filed Weights   08/26/23 1509  Weight: 59 kg    Exam:  GEN: NAD SKIN: Warm and dry EYES: No pallor or icterus ENT: MMM CV: RRR PULM: CTA B ABD: soft, ND, NT, +BS CNS: Drowsy, confused EXT: Swelling and tenderness of right thigh.  Ecchymosis on medial aspect of right thigh.  Right lower extremity started on the left.       Data Reviewed:   I have personally reviewed following labs and imaging studies:  Labs: Labs show the following:   Basic Metabolic Panel: Recent Labs  Lab 08/26/23 1606 08/27/23 0436 08/28/23 0610  NA 136 140 135  K 5.3* 4.0 3.7  CL 97* 104 99  CO2 24 25 25   GLUCOSE 141* 107* 116*  BUN 59* 60* 49*  CREATININE 1.59* 1.42* 1.07*  CALCIUM 9.1 8.2* 8.4*   GFR Estimated Creatinine Clearance: 27.1 mL/min (A) (by C-G formula based on SCr of 1.07 mg/dL (H)). Liver Function Tests: Recent Labs  Lab 08/26/23 1606  AST 51*  ALT 30  ALKPHOS 43  BILITOT 2.0*  PROT 6.8  ALBUMIN 3.9   No results for input(s): "LIPASE", "AMYLASE" in the last 168 hours. No results for input(s): "AMMONIA" in the last 168 hours. Coagulation profile No results for input(s): "INR", "PROTIME" in the last 168  hours.  CBC: Recent Labs  Lab 08/26/23 1606 08/27/23 0436 08/28/23 0610  WBC 14.0* 11.3* 12.7*  NEUTROABS 11.5*  --   --   HGB 12.5 9.8* 11.0*  HCT 38.6 29.7* 33.1*  MCV 81.3 81.1 80.9  PLT 217 178 224   Cardiac Enzymes: No results for input(s): "CKTOTAL", "CKMB", "CKMBINDEX", "TROPONINI" in the last 168 hours. BNP (last 3 results) No results for input(s): "PROBNP" in the last 8760 hours. CBG: No results for input(s): "GLUCAP" in the last 168 hours. D-Dimer: No results for input(s): "DDIMER" in the last 72 hours. Hgb A1c: No results for input(s): "HGBA1C" in the last 72 hours. Lipid Profile: No results for input(s): "CHOL", "HDL", "LDLCALC", "TRIG", "CHOLHDL", "LDLDIRECT"  in the last 72 hours. Thyroid function studies: No results for input(s): "TSH", "T4TOTAL", "T3FREE", "THYROIDAB" in the last 72 hours.  Invalid input(s): "FREET3" Anemia work up: No results for input(s): "VITAMINB12", "FOLATE", "FERRITIN", "TIBC", "IRON", "RETICCTPCT" in the last 72 hours. Sepsis Labs: Recent Labs  Lab 08/26/23 1606 08/27/23 0436 08/28/23 0610  WBC 14.0* 11.3* 12.7*    Microbiology No results found for this or any previous visit (from the past 240 hour(s)).  Procedures and diagnostic studies:  CT HEAD WO CONTRAST ( )  Result Date: 08/28/2023 CLINICAL DATA:  Follow-up examination for subdural hemorrhage. EXAM: CT HEAD WITHOUT CONTRAST TECHNIQUE: Contiguous axial images were obtained from the base of the skull through the vertex without intravenous contrast. RADIATION DOSE REDUCTION: This exam was performed according to the departmental dose-optimization program which includes automated exposure control, adjustment of the mA and/or kV according to patient size and/or use of iterative reconstruction technique. COMPARISON:  Prior CT from 08/27/2023. FINDINGS: Brain: Previously identified subdural hematoma overlying the left cerebral convexity again seen. Hemorrhage is relatively  similar measuring 3-4 mm in maximal thickness, not significantly changed. No significant mass effect or midline shift. No other new intracranial hemorrhage. No acute large vessel territory infarct. No mass lesion or hydrocephalus. Underlying atrophy with chronic small vessel ischemic disease noted. Vascular: No abnormal hyperdense vessel. Scattered calcified atherosclerosis present at the skull base. Skull: Scalp soft tissues and calvarium demonstrate no new finding. Sinuses/Orbits: Globes and orbital soft tissues within normal limits. Paranasal sinuses and mastoid air cells remain largely clear. IMPRESSION: 1. No significant interval change in size of 3-4 mm subdural hematoma overlying the left cerebral convexity. No significant mass effect or midline shift. 2. No other new acute intracranial abnormality. 3. Underlying atrophy with chronic small vessel ischemic disease. Electronically Signed   By: Rise Mu M.D.   On: 08/28/2023 04:31   CT HEAD WO CONTRAST ( )  Addendum Date: 08/27/2023   ADDENDUM REPORT: 08/27/2023 18:56 ADDENDUM: Critical Value/emergent results were called by telephone at the time of interpretation on 08/27/2023 at 6:56 pm to provider Lurene Shadow , who verbally acknowledged these results. Electronically Signed   By: Orvan Falconer M.D.   On: 08/27/2023 18:56   Result Date: 08/27/2023 CLINICAL DATA:  Mental status change, unknown cause. EXAM: CT HEAD WITHOUT CONTRAST TECHNIQUE: Contiguous axial images were obtained from the base of the skull through the vertex without intravenous contrast. RADIATION DOSE REDUCTION: This exam was performed according to the departmental dose-optimization program which includes automated exposure control, adjustment of the mA and/or kV according to patient size and/or use of iterative reconstruction technique. COMPARISON:  Head CT 01/25/2022. FINDINGS: Brain: Acute left hemispheric subdural hematoma measuring up to 3 mm (coronal image 29  series 4. No significant mass effect or midline shift. Stable background of mild chronic small-vessel disease. No hydrocephalus or loss of gray-white differentiation. Vascular: No hyperdense vessel or unexpected calcification. Skull: No calvarial fracture or suspicious bone lesion. Skull base is unremarkable. Sinuses/Orbits: No acute finding. Other: None. IMPRESSION: Acute left hemispheric subdural hematoma measuring up to 3 mm. No significant mass effect or midline shift. Radiology assistant personnel have been notified to put me in telephone contact with the referring physician or the referring physician's clinical representative in order to discuss these findings. Once this communication is established I will issue an addendum to this report for documentation purposes. Electronically Signed: By: Orvan Falconer M.D. On: 08/27/2023 18:46   DG FEMUR, MIN 2 VIEWS RIGHT  Result Date:  08/26/2023 CLINICAL DATA:  Fall 3 days ago. Pain and bruising in the right thigh from the hip to the knee. Right hip pain with external rotation. EXAM: RIGHT FEMUR 2 VIEWS COMPARISON:  None Available. FINDINGS: A complex intratrochanteric fracture is present. The fracture is foreshortened. The fracture fragment is displaced posteriorly. Fragments of the lesser and greater trochanter are mildly displaced. IMPRESSION: Complex intratrochanteric fracture with displacement and foreshortening. Electronically Signed   By: Marin Roberts M.D.   On: 08/26/2023 17:50   DG Chest 1 View  Result Date: 08/26/2023 CLINICAL DATA:  Fall.  Hip fracture. EXAM: CHEST  1 VIEW COMPARISON:  Two-view chest x-ray a 10/13/2016 FINDINGS: The heart is enlarged. Atherosclerotic calcifications are present at the aortic arch. A large hiatal hernia is again noted. Changes of COPD are present. No edema or effusion is present. Degenerative changes are present in the shoulders bilaterally. Rightward curvature of the thoracic spine is noted. IMPRESSION: 1.  Cardiomegaly without failure. 2. Large hiatal hernia. 3. No acute cardiopulmonary disease. Electronically Signed   By: Marin Roberts M.D.   On: 08/26/2023 17:49               LOS: 2 days   Sanora Cunanan  [facility]   Pager on [email or web address]. If 7PM-7AM, please contact night-coverage at [email or web address]     08/28/2023, 12:16 PM

## 2023-08-28 NOTE — Progress Notes (Signed)
Initial Nutrition Assessment  DOCUMENTATION CODES:   Not applicable  INTERVENTION:  Liberalize diet to regula Ensure BID   NUTRITION DIAGNOSIS:   Increased nutrient needs related to hip fracture as evidenced by estimated needs.    GOAL:   Patient will meet greater than or equal to 90% of their needs    MONITOR:   PO intake, Supplement acceptance, Labs, Weight trends  REASON FOR ASSESSMENT:   Consult Hip fracture protocol, Assessment of nutrition requirement/status  ASSESSMENT: 89 y.o. F, reported fall with on going pain was found to have have a right closed comminuted intertrochanteric fracture. Pmhx; A-fib, DM-2, HTN, falls, CHF, anxiety, HLD, Reportedly fell on 10/07 but did not present to hospital till 10/11.  Pt reached out to with no answer. All information obtained through team and EMR; Review of EMR revealed: Pt lives at home, with history of falls.  Non significant weight decline.  Independent feeding ability, with no chewing or swallowing concerns noted at this time. There is no benefit to excessive dietary restrictions related to advanced age and increased nutrient needs.  Suspect pt would benefit from a liberalized diet.   Admit weight: 59 kg Current weight: 59 kg Weight history; 08/26/23 59 kg  07/15/23 62.3 kg  01/13/23 65.8 kg  07/08/22 67.9 kg       Average Meal Intake:  100% intake x 1 recorded meals  Nutritionally Relevant Medications: Scheduled Meds:  atorvastatin  20 mg Oral QHS   docusate sodium  100 mg Oral BID   sertraline  25 mg Oral QHS   traZODone  50 mg Oral QHS    Continuous Infusions:  methocarbamol (ROBAXIN) IV     PRN Meds:.bisacodyl, HYDROcodone-acetaminophen, methocarbamol **OR** methocarbamol (ROBAXIN) IV, morphine injection, polyethylene glycol  Labs Reviewed: CBG ranges from 107-116 mg/dL over the last 24 hours HgbA1c 6.2    NUTRITION - FOCUSED PHYSICAL EXAM:  Deferred  Diet Order:   Diet Order              Diet Heart Room service appropriate? Yes; Fluid consistency: Thin  Diet effective now                   EDUCATION NEEDS:   No education needs have been identified at this time  Skin:  Skin Assessment: Reviewed RN Assessment  Last BM:  10/12  Height:   Ht Readings from Last 1 Encounters:  08/26/23 5\' 1"  (1.549 m)    Weight:   Wt Readings from Last 1 Encounters:  08/26/23 59 kg    Ideal Body Weight:     BMI:  Body mass index is 24.56 kg/m.  Estimated Nutritional Needs:   Kcal:  1750-2000 kcal/day  Protein:  75-90 g /d  Fluid:  74ml/kcal    Jamelle Haring RDN, LDN Clinical Dietitian  RDN pager # available on Amion

## 2023-08-28 NOTE — Progress Notes (Signed)
ORTHOPEDIC PROGRESS NOTE  SUBJECTIVE:     The patient is resting quietly in bed.  A third son is present this morning.  Once again I discussed with the family that at their request I had spoke with Dr. Ernest Pine who also feels comfort care is most appropriate.  OBJECTIVE:  Vitals:   08/28/23 0310 08/28/23 0737  BP: (!) 93/57 110/64  Pulse: 75 80  Resp: 16 16  Temp: 98.7 F (37.1 C) 97.6 F (36.4 C)  SpO2: 95% 93%    The patient's bilateral lower extremities are neurovascularly intact.  Their toes are pink and warm with brisk capillary refill time.  Dorsalis pedis and posterior tibial pulse are 2+ and symmetric. The patient's calves are soft, nontender, with a negative Homans test bilaterally.  Labs:  Recent Labs    08/26/23 1606 08/27/23 0436 08/28/23 0610  HGB 12.5 9.8* 11.0*   Recent Labs    08/27/23 0436 08/28/23 0610  WBC 11.3* 12.7*  RBC 3.66* 4.09  HCT 29.7* 33.1*  PLT 178 224   Recent Labs    08/27/23 0436 08/28/23 0610  NA 140 135  K 4.0 3.7  CL 104 99  CO2 25 25  BUN 60* 49*  CREATININE 1.42* 1.07*  GLUCOSE 107* 116*  CALCIUM 8.2* 8.4*   No results for input(s): "LABPT", "INR" in the last 72 hours.    ASSESSMENT:  Assessment: 89 year old female status post ground-level fall 08/26/2023 with right closed highly comminuted intertrochanteric hip fracture with significant decrease in bone quality with multiple significant medical comorbidities       PLAN:  Discharge planning for subacute rehab placement/skilled nursing facility.   Cecil Cranker M.D. 08/28/2023 11:28 AM

## 2023-08-28 NOTE — TOC Initial Note (Signed)
Transition of Care [facility]) - Initial/Assessment Note    Patient Details  Name: Melinda Preston MRN: [id number] Date of Birth: 01/17/1928  Transition of Care [facility]) CM/SW Contact:    Liliana Cline, LCSW Phone Number: 08/28/2023, 2:05 PM  Clinical Narrative:                 Checked with MD - requests eval for hospice home. Ree Kida with [facility] updated.  2:05- Ree Kida with [facility] has spoken with the family and they chose to bring patient home with hospice services. Plan for DC tomorrow pending hospital bed delivery to the home. Will need EMS.  Expected Discharge Plan: Hospice Medical Facility Barriers to Discharge: Continued Medical Work up   Patient Goals and CMS Choice            Expected Discharge Plan and Services                                              Prior Living Arrangements/Services     Patient language and need for interpreter reviewed:: Yes        Need for Family Participation in Patient Care: Yes (Comment) Care giver support system in place?: Yes (comment)   Criminal Activity/Legal Involvement Pertinent to Current Situation/Hospitalization: No - Comment as needed  Activities of Daily Living   ADL Screening (condition at time of admission) Independently performs ADLs?: No Does the patient have a NEW difficulty with bathing/dressing/toileting/self-feeding that is expected to last >3 days?: No Does the patient have a NEW difficulty with getting in/out of bed, walking, or climbing stairs that is expected to last >3 days?: Yes (Initiates electronic notice to provider for possible PT consult) Does the patient have a NEW difficulty with communication that is expected to last >3 days?: No Is the patient deaf or have difficulty hearing?: No Does the patient have difficulty seeing, even when wearing glasses/contacts?: No Does the patient have difficulty concentrating, remembering, or making decisions?: Yes  Permission Sought/Granted                   Emotional Assessment         Alcohol / Substance Use: Not Applicable Psych Involvement: No (comment)  Admission diagnosis:  Fall, initial encounter [W19.XXXA] Closed right hip fracture, initial encounter (HCC) [S72.001A] Fall at home, initial encounter [W19.XXXA, Y92.009] Displaced intertrochanteric fracture of right femur, initial encounter for closed fracture [facility]) [S72.141A] Patient Active Problem List   Diagnosis Date Noted   Acute subdural hematoma (HCC) 08/28/2023   AKI (acute kidney injury) (HCC) 08/27/2023   Hyperkalemia 08/27/2023   Closed right hip fracture, initial encounter (HCC) 08/26/2023   Fall at home, initial encounter 08/26/2023   Acute respiratory failure with hypoxia (HCC) 08/26/2023   Osteopenia of neck of left femur 01/08/2023   Nonrheumatic aortic valve insufficiency 03/16/2022   Retinopathy, background, nonproliferative, mild 07/15/2021   Aortic atherosclerosis (HCC) 01/03/2021   At high risk for falls 04/23/2020   History of subdural hematoma 03/28/20 04/16/2020   Chronic systolic CHF (congestive heart failure) (HCC) 04/16/2020   IFG (impaired fasting glucose) 02/21/2018   GERD without esophagitis 02/21/2018   Advanced care planning/counseling discussion 08/09/2017   Hypercholesterolemia 12/22/2015   Essential hypertension 12/22/2015   Depression 12/22/2015   Chronic atrial fibrillation (HCC) 12/22/2015   Arthritis, senescent 12/22/2015   Insomnia 12/22/2015   Overactive bladder 12/22/2015  PCP:  Marjie Skiff, NP Pharmacy:   [facility] [phone] - [place], [place] - [address] AT [facility] OF [address] [place] [address] [place] [address] Phone: [phone] Fax: [phone]  [facility]) - [place], [place] - [address] [facility] [address] [place] [address] [place] [address] Phone: [phone] Fax: [phone]     Social Determinants of Health (SDOH) Social  History: SDOH Screenings   Food Insecurity: No Food Insecurity (08/27/2023)  Housing: Low Risk  (08/27/2023)  Transportation Needs: No Transportation Needs (08/27/2023)  Utilities: Not At Risk (08/27/2023)  Alcohol Screen: Low Risk  (07/26/2022)  Depression (PHQ2-9): Low Risk  (07/15/2023)  Financial Resource Strain: Low Risk  (07/26/2022)  Physical Activity: Inactive (07/26/2022)  Social Connections: Socially Isolated (07/26/2022)  Stress: No Stress Concern Present (07/26/2022)  Tobacco Use: Low Risk  (08/26/2023)   SDOH Interventions:     Readmission Risk Interventions     No data to display

## 2023-08-28 NOTE — Consult Note (Signed)
Referring Physician:  No referring provider defined for this encounter.  Primary Physician:  Marjie Skiff, NP  Chief Complaint:  altered mental status.  History of Present Illness: Melinda Preston is a 89 y.o. female who presents with the chief complaint of altered mental status.    Female with medical history significant for permanent atrial fibrillation, hydrodilatation, hypertension, hyperlipidemia, nonischemic dilated cardiomyopathy, chronic systolic CHF (EF 82%), left bundle branch block, frequent falls, history of subdural hematoma and no longer on long-term anticoagulation.  She presented to the hospital because of severe pain in the right hip following a mechanical fall at home.  Reportedly she fell on Monday, 08/22/2023.  She did not present to the hospital until 08/26/2023 when her pain got worse.   Patient was having some altered mental status and CT was ordered that showed small left subdural hematoma.   The symptoms are causing a significant impact on the patient's life.   Review of Systems:  A 10 point review of systems is negative, except for the pertinent positives and negatives detailed in the HPI.  Past Medical History: Past Medical History:  Diagnosis Date   Anxiety    Aortic heart valve narrowing    Atrial fibrillation (HCC)    Diabetes mellitus without complication (HCC)    Disorder of aorta (HCC)    Hyperlipidemia    Hypertension    Insomnia    Overactive bladder     Past Surgical History: Past Surgical History:  Procedure Laterality Date   APPENDECTOMY     rotator cuff surgery Left     Problem List: Patient Active Problem List   Diagnosis Date Noted   Acute subdural hematoma (HCC) 08/28/2023   AKI (acute kidney injury) (HCC) 08/27/2023   Hyperkalemia 08/27/2023   Closed right hip fracture, initial encounter (HCC) 08/26/2023   Fall at home, initial encounter 08/26/2023   Acute respiratory failure with hypoxia (HCC) 08/26/2023    Osteopenia of neck of left femur 01/08/2023   Nonrheumatic aortic valve insufficiency 03/16/2022   Retinopathy, background, nonproliferative, mild 07/15/2021   Aortic atherosclerosis (HCC) 01/03/2021   At high risk for falls 04/23/2020   History of subdural hematoma 03/28/20 04/16/2020   Chronic systolic CHF (congestive heart failure) (HCC) 04/16/2020   IFG (impaired fasting glucose) 02/21/2018   GERD without esophagitis 02/21/2018   Advanced care planning/counseling discussion 08/09/2017   Hypercholesterolemia 12/22/2015   Essential hypertension 12/22/2015   Depression 12/22/2015   Chronic atrial fibrillation (HCC) 12/22/2015   Arthritis, senescent 12/22/2015   Insomnia 12/22/2015   Overactive bladder 12/22/2015    Allergies: Allergies as of 08/26/2023 - Review Complete 08/26/2023  Allergen Reaction Noted   Lisinopril Cough 01/31/2017    Medications: @ENCMED @  Social History: Social History   Tobacco Use   Smoking status: Never   Smokeless tobacco: Never  Vaping Use   Vaping status: Never Used  Substance Use Topics   Alcohol use: No   Drug use: No    Family Medical History: Family History  Problem Relation Age of Onset   Heart disease Father    Breast cancer Sister    Heart disease Brother    ADD / ADHD Daughter    Diabetes Son     Physical Examination: @VITALWITHPAIN @  General: Patient is well developed, well nourished, calm, collected, and in no apparent distress.  Psychiatric: Patient is non-anxious.  Head:  Pupils equal, round, and reactive to light.  ENT:  Oral mucosa appears well hydrated.  Neck:  Supple.  Full range of motion.  Respiratory: Patient is breathing without any difficulty.  Extremities: No edema.  Vascular: Palpable pulses.  Skin:   On exposed skin, there are no abnormal skin lesions.  NEUROLOGICAL:  General: In no acute distress.   Awake, alert, oriented to person, place, and time.  Pupils equal round and reactive to  light.  Facial tone is symmetric.  Tongue protrusion is midline.  There is no pronator drift.    Strength: Side Biceps Triceps Deltoid Interossei Grip Wrist Ext. Wrist Flex.  R 5 5 5 5 5 5 5   L 5 5 5 5 5 5 5    Side Iliopsoas Quads Hamstring PF DF EHL  R Pain limited Pain limited Pain lmited Wiggles toes     L 5 5 5 5 5 5    Clonus is not present.  Toes are down-going.  Marland Kitchen  Hoffman's is absent.  Imaging: 1. No significant interval change in size of 3-4 mm subdural hematoma overlying the left cerebral convexity. No significant mass effect or midline shift. 2. No other new acute intracranial abnormality. 3. Underlying atrophy with chronic small vessel ischemic disease.  I have personally reviewed the images and agree with the above interpretation.  Assessment and Plan: [name] is a pleasant 89 y.o. female with stable, asymptomatic left subdural hematoma.  I have discussed the condition with the patient, including showing the radiographs and discussing treatment options in layman's terms.  The patient may benefit from conservative management.  Thus, I have recommended the following:   1) No acute surgical indications  2) Will sign off.  Outpatient follow up will be arranged.  I will see the patient back in a few weeks to gauge progress.  Thank you for involving me in the care of this patient. I will keep you apprised of the patient's progress.   This note was partially dictated using voice recognition software, so please excuse any errors that were not corrected.  Peterson Ao MD, PhD

## 2023-08-29 DIAGNOSIS — S72001A Fracture of unspecified part of neck of right femur, initial encounter for closed fracture: Secondary | ICD-10-CM | POA: Diagnosis not present

## 2023-08-29 MED ORDER — HYDROCODONE-ACETAMINOPHEN 5-325 MG PO TABS: 1.0000 | ORAL_TABLET | Freq: Four times a day (QID) | ORAL | 0 refills | Status: DC | PRN

## 2023-08-29 NOTE — TOC Transition Note (Signed)
Transition of Care [facility]) - CM/SW Discharge Note   Patient Details  Name: Melinda Preston MRN: [id number] Date of Birth: 06-12-1928  Transition of Care [facility]) CM/SW Contact:  Garret Reddish, RN Phone Number: 08/29/2023, 12:58 PM   Clinical Narrative:     Chart reviewed.  Noted that patient has orders for discharge today.  Patient will be going to her son's home with [facility] following.    [facility] has confirmed hospital bed and over bed table delivered to son's home.    [facility] has arranged for [facility] to transport patient to patient's son home today.    I have informed staff nurse of above.    Final next level of care: Home w Hospice Care Barriers to Discharge: No Barriers Identified   Patient Goals and CMS Choice CMS Medicare.gov Compare Post Acute Care list provided to:: Patient Represenative (must comment) (Patient's son Jillyn Hidden)    Discharge Placement                  Patient to be transferred to facility by: [facility] Name of family member notified: Jillyn Hidden Patient and family notified of of transfer: 08/29/23  Discharge Plan and Services Additional resources added to the After Visit Summary for                  DME Arranged:  (DME arranged per [facility], Hospital bed and over bed table.)                    Social Determinants of Health (SDOH) Interventions SDOH Screenings   Food Insecurity: No Food Insecurity (08/27/2023)  Housing: Low Risk  (08/27/2023)  Transportation Needs: No Transportation Needs (08/27/2023)  Utilities: Not At Risk (08/27/2023)  Alcohol Screen: Low Risk  (07/26/2022)  Depression (PHQ2-9): Low Risk  (07/15/2023)  Financial Resource Strain: Low Risk  (07/26/2022)  Physical Activity: Inactive (07/26/2022)  Social Connections: Socially Isolated (07/26/2022)  Stress: No Stress Concern Present (07/26/2022)  Tobacco Use: Low Risk  (08/26/2023)     Readmission Risk  Interventions     No data to display

## 2023-08-29 NOTE — Progress Notes (Signed)
Nutrition Brief Note  Chart reviewed. MD recommending comfort care. Plan to discharge home with hospice services today, once needed equipment has been delivered.  No further nutrition interventions planned at this time.  Please re-consult as needed.   Levada Schilling, RD, LDN, CDCES Registered Dietitian III Certified Diabetes Care and Education Specialist Please refer to [facility] for RD and/or RD on-call/weekend/after hours pager

## 2023-08-29 NOTE — Plan of Care (Signed)
Problem: Coping: Goal: Level of anxiety will decrease Outcome: Progressing   Problem: Elimination: Goal: Will not experience complications related to bowel motility Outcome: Progressing Goal: Will not experience complications related to urinary retention Outcome: Progressing   Problem: Pain Managment: Goal: General experience of comfort will improve Outcome: Progressing   Problem: Safety: Goal: Ability to remain free from injury will improve Outcome: Progressing

## 2023-08-29 NOTE — Care Management Important Message (Signed)
Important Message  Patient Details  Name: Melinda Preston MRN: [id number] Date of Birth: Jan 05, 1928   Important Message Given:  Other (see comment)  Patient to discharge home with hospice. Out of respect for the patient and family no Important Message from [facility] given.    Olegario Messier A Alaysia Lightle 08/29/2023, 9:31 AM

## 2023-08-29 NOTE — Plan of Care (Signed)

## 2023-08-29 NOTE — Progress Notes (Signed)
ORTHOPEDIC PROGRESS NOTE  SUBJECTIVE:     The patient is resting quietly in bed.  Very chatty this morning.  Appears very comfortable and in no acute distress.  Neurosurgical note reviewed.  OBJECTIVE:  Vitals:   08/29/23 1021 08/29/23 1037  BP:    Pulse:    Resp:    Temp: (!) 97.5 F (36.4 C) 98.3 F (36.8 C)  SpO2:      The patient's bilateral lower extremities are neurovascularly intact.  Their toes are pink and warm with brisk capillary refill time.  Dorsalis pedis and posterior tibial pulse are 2+ and symmetric. The patient's calves are soft, nontender, with a negative Homans test bilaterally.  The patient's hip exam once again, is stable and unchanged.  Labs:  Recent Labs    08/26/23 1606 08/27/23 0436 08/28/23 0610  HGB 12.5 9.8* 11.0*   Recent Labs    08/27/23 0436 08/28/23 0610  WBC 11.3* 12.7*  RBC 3.66* 4.09  HCT 29.7* 33.1*  PLT 178 224   Recent Labs    08/27/23 0436 08/28/23 0610  NA 140 135  K 4.0 3.7  CL 104 99  CO2 25 25  BUN 60* 49*  CREATININE 1.42* 1.07*  GLUCOSE 107* 116*  CALCIUM 8.2* 8.4*   No results for input(s): "LABPT", "INR" in the last 72 hours.    ASSESSMENT:  Assessment: 89 year old female status post ground-level fall 08/26/2023 with right closed highly comminuted intertrochanteric hip fracture with significant decrease in bone quality with multiple significant medical comorbidities    The patient is stable and can be discharged from the orthopedic standpoint.  Family in agreement with comfort care.   PLAN:  Discharge planning for subacute rehab placement/skilled nursing facility. Follow-up in the office in 10-14 days.  The patient may contact the [facility] at [phone]  to schedule their follow-up appointment.   Cecil Cranker M.D. 08/29/2023 12:23 PM

## 2023-08-29 NOTE — Discharge Summary (Signed)
Physician Discharge Summary   Patient: Melinda Preston MRN: [id number] DOB: 1927-12-18  Admit date:     08/26/2023  Discharge date: 08/29/23  Discharge Physician: Lurene Shadow   PCP: Marjie Skiff, NP   Recommendations at discharge:   Follow-up with hospice team within 24 hours of discharge  Discharge Diagnoses: Principal Problem:   Closed right hip fracture, initial encounter [facility]) Active Problems:   Fall at home, initial encounter   Acute respiratory failure with hypoxia (HCC)   Chronic systolic CHF (congestive heart failure) (HCC)   Essential hypertension   Chronic atrial fibrillation (HCC)   GERD without esophagitis   History of subdural hematoma 03/28/20   AKI (acute kidney injury) (HCC)   Hyperkalemia   Acute subdural hematoma (HCC)  Resolved Problems:   * No resolved hospital problems. *  Hospital Course:  Melinda Preston is a 89 y.o. female with medical history significant for permanent atrial fibrillation, hydrodilatation, hypertension, hyperlipidemia, nonischemic dilated cardiomyopathy, chronic systolic CHF (EF 57%), left bundle branch block, frequent falls, history of subdural hematoma and no longer on long-term anticoagulation.  She presented to the hospital because of severe pain in the right hip following a mechanical fall at home.  Reportedly she fell on Monday, 08/22/2023.  She did not present to the hospital until 08/26/2023 when her pain got worse.   She was found to have complex right intratrochanteric fracture with displacement and foreshortening.    Assessment and Plan:  Right closed comminuted intertrochanteric hip fracture s/p fall at home:  Per orthopedic surgeon, patient is at very high risk for surgery so comfort care was recommended.  Vicodin as needed for pain. Patient is DNR per discussions with family.   Acute left subdural hematoma without significant mass effect or midline shift: Repeat CT head did not show any significant  change in size of hematoma.  He was evaluated by Dr. Emogene Morgan, neurosurgeon.  Subdural hematoma is small and there is no indication for surgery.     AKI: Improved from 1.59-1.07.   Hold Lasix and Entresto. Baseline creatinine has mostly been below 0.9.       Hyperkalemia: Improved.  Hold potassium supplement.     Confusion, suspected delirium: Continue supportive care.     Chronic systolic CHF: Compensated EF 35% on 2D echo from May 2023     Permanent atrial fibrillation: No longer on anticoagulation because of frequent falls and history of subdural hematoma     Leukocytosis improving.  This is likely reactive     Doubt acute hypoxemic respiratory failure at this time.  She is tolerating room air     Other comorbidities include advanced age, history of subdural hematoma, hypertension, GERD, large hiatal hernia. Family suspect she has underlying dementia though she has not been formally diagnosed with dementia.     Discharge plan discussed with Jillyn Hidden (son) and Olegario Messier (daughter-in-law) over the phone.  Cardiac and nonessential medications will be discontinued at this time.  Focus will be on pain control and comfort care.      Consultants: Orthopedic surgeon, neurosurgeon Procedures performed: None Disposition: Home Diet recommendation:  Regular diet DISCHARGE MEDICATION: Allergies as of 08/29/2023       Reactions   Lisinopril Cough        Medication List     STOP taking these medications    atorvastatin 20 MG tablet Commonly known as: LIPITOR   CALCIUM 600+D3 PO   carvedilol 6.25 MG tablet Commonly known as: COREG   Fish  Oil 1000 MG Caps   furosemide 40 MG tablet Commonly known as: LASIX   potassium chloride SA 20 MEQ tablet Commonly known as: KLOR-CON M   sacubitril-valsartan 24-26 MG Commonly known as: ENTRESTO   sertraline 25 MG tablet Commonly known as: ZOLOFT   traZODone 50 MG tablet Commonly known as: DESYREL   VITAMIN D  (CHOLECALCIFEROL) PO       TAKE these medications    HYDROcodone-acetaminophen 5-325 MG tablet Commonly known as: NORCO/VICODIN Take 1 tablet by mouth every 6 (six) hours as needed for moderate pain.   omeprazole 20 MG capsule Commonly known as: PRILOSEC TAKE 1 CAPSULE(20 MG) BY MOUTH DAILY               Durable Medical Equipment  (From admission, onward)           Start     Ordered   08/28/23 0000  For home use only DME Hospital bed       Question Answer Comment  Length of Need Lifetime   Bed type Semi-electric      08/28/23 1401            Discharge Exam: Filed Weights   08/26/23 1509  Weight: 59 kg   GEN: NAD SKIN: Warm and dry EYES: No pallor or icterus ENT: MMM CV: RRR PULM: CTA B ABD: soft, ND, NT, +BS CNS: AAO x 3 (mental status is better), non focal EXT: Right thigh swelling and tenderness.  Ecchymosis on medial aspect of right thigh.  Right lower extremity is shortened and externally rotated   Condition at discharge: stable  The results of significant diagnostics from this hospitalization (including imaging, microbiology, ancillary and laboratory) are listed below for reference.   Imaging Studies: CT HEAD WO CONTRAST ( )  Result Date: 08/28/2023 CLINICAL DATA:  Follow-up examination for subdural hemorrhage. EXAM: CT HEAD WITHOUT CONTRAST TECHNIQUE: Contiguous axial images were obtained from the base of the skull through the vertex without intravenous contrast. RADIATION DOSE REDUCTION: This exam was performed according to the departmental dose-optimization program which includes automated exposure control, adjustment of the mA and/or kV according to patient size and/or use of iterative reconstruction technique. COMPARISON:  Prior CT from 08/27/2023. FINDINGS: Brain: Previously identified subdural hematoma overlying the left cerebral convexity again seen. Hemorrhage is relatively similar measuring 3-4 mm in maximal thickness, not  significantly changed. No significant mass effect or midline shift. No other new intracranial hemorrhage. No acute large vessel territory infarct. No mass lesion or hydrocephalus. Underlying atrophy with chronic small vessel ischemic disease noted. Vascular: No abnormal hyperdense vessel. Scattered calcified atherosclerosis present at the skull base. Skull: Scalp soft tissues and calvarium demonstrate no new finding. Sinuses/Orbits: Globes and orbital soft tissues within normal limits. Paranasal sinuses and mastoid air cells remain largely clear. IMPRESSION: 1. No significant interval change in size of 3-4 mm subdural hematoma overlying the left cerebral convexity. No significant mass effect or midline shift. 2. No other new acute intracranial abnormality. 3. Underlying atrophy with chronic small vessel ischemic disease. Electronically Signed   By: Rise Mu M.D.   On: 08/28/2023 04:31   CT HEAD WO CONTRAST ( )  Addendum Date: 08/27/2023   ADDENDUM REPORT: 08/27/2023 18:56 ADDENDUM: Critical Value/emergent results were called by telephone at the time of interpretation on 08/27/2023 at 6:56 pm to provider Lurene Shadow , who verbally acknowledged these results. Electronically Signed   By: Orvan Falconer M.D.   On: 08/27/2023 18:56   Result Date: 08/27/2023 CLINICAL  DATA:  Mental status change, unknown cause. EXAM: CT HEAD WITHOUT CONTRAST TECHNIQUE: Contiguous axial images were obtained from the base of the skull through the vertex without intravenous contrast. RADIATION DOSE REDUCTION: This exam was performed according to the departmental dose-optimization program which includes automated exposure control, adjustment of the mA and/or kV according to patient size and/or use of iterative reconstruction technique. COMPARISON:  Head CT 01/25/2022. FINDINGS: Brain: Acute left hemispheric subdural hematoma measuring up to 3 mm (coronal image 29 series 4. No significant mass effect or midline shift.  Stable background of mild chronic small-vessel disease. No hydrocephalus or loss of gray-white differentiation. Vascular: No hyperdense vessel or unexpected calcification. Skull: No calvarial fracture or suspicious bone lesion. Skull base is unremarkable. Sinuses/Orbits: No acute finding. Other: None. IMPRESSION: Acute left hemispheric subdural hematoma measuring up to 3 mm. No significant mass effect or midline shift. Radiology assistant personnel have been notified to put me in telephone contact with the referring physician or the referring physician's clinical representative in order to discuss these findings. Once this communication is established I will issue an addendum to this report for documentation purposes. Electronically Signed: By: Orvan Falconer M.D. On: 08/27/2023 18:46   DG FEMUR, MIN 2 VIEWS RIGHT  Result Date: 08/26/2023 CLINICAL DATA:  Fall 3 days ago. Pain and bruising in the right thigh from the hip to the knee. Right hip pain with external rotation. EXAM: RIGHT FEMUR 2 VIEWS COMPARISON:  None Available. FINDINGS: A complex intratrochanteric fracture is present. The fracture is foreshortened. The fracture fragment is displaced posteriorly. Fragments of the lesser and greater trochanter are mildly displaced. IMPRESSION: Complex intratrochanteric fracture with displacement and foreshortening. Electronically Signed   By: Marin Roberts M.D.   On: 08/26/2023 17:50   DG Chest 1 View  Result Date: 08/26/2023 CLINICAL DATA:  Fall.  Hip fracture. EXAM: CHEST  1 VIEW COMPARISON:  Two-view chest x-ray a 10/13/2016 FINDINGS: The heart is enlarged. Atherosclerotic calcifications are present at the aortic arch. A large hiatal hernia is again noted. Changes of COPD are present. No edema or effusion is present. Degenerative changes are present in the shoulders bilaterally. Rightward curvature of the thoracic spine is noted. IMPRESSION: 1. Cardiomegaly without failure. 2. Large hiatal hernia.  3. No acute cardiopulmonary disease. Electronically Signed   By: Marin Roberts M.D.   On: 08/26/2023 17:49    Microbiology: Results for orders placed or performed during the hospital encounter of 04/16/20  SARS Coronavirus 2 by RT PCR (hospital order, performed in [facility] hospital lab) Nasopharyngeal Nasopharyngeal Swab     Status: None   Collection Time: 04/16/20  6:07 AM   Specimen: Nasopharyngeal Swab  Result Value Ref Range Status   SARS Coronavirus 2 NEGATIVE NEGATIVE Final    Comment: (NOTE) SARS-CoV-2 target nucleic acids are NOT DETECTED. The SARS-CoV-2 RNA is generally detectable in upper and lower respiratory specimens during the acute phase of infection. The lowest concentration of SARS-CoV-2 viral copies this assay can detect is 250 copies / mL. A negative result does not preclude SARS-CoV-2 infection and should not be used as the sole basis for treatment or other patient management decisions.  A negative result may occur with improper specimen collection / handling, submission of specimen other than nasopharyngeal swab, presence of viral mutation(s) within the areas targeted by this assay, and inadequate number of viral copies (<250 copies / mL). A negative result must be combined with clinical observations, patient history, and epidemiological information. Fact Sheet for  Patients:   BoilerBrush.com.cy Fact Sheet for Healthcare Providers: https://pope.com/ This test is not yet approved or cleared  by the [place] FDA and has been authorized for detection and/or diagnosis of SARS-CoV-2 by FDA under an Emergency Use Authorization (EUA).  This EUA will remain in effect (meaning this test can be used) for the duration of the COVID-19 declaration under Section 564(b)(1) of the Act, 21 U.S.C. section 360bbb-3(b)(1), unless the authorization is terminated or revoked sooner. Performed at [facility], [address] [place], [place] [address]     Labs: CBC: Recent Labs  Lab 08/26/23 1606 08/27/23 0436 08/28/23 0610  WBC 14.0* 11.3* 12.7*  NEUTROABS 11.5*  --   --   HGB 12.5 9.8* 11.0*  HCT 38.6 29.7* 33.1*  MCV 81.3 81.1 80.9  PLT 217 178 224   Basic Metabolic Panel: Recent Labs  Lab 08/26/23 1606 08/27/23 0436 08/28/23 0610  NA 136 140 135  K 5.3* 4.0 3.7  CL 97* 104 99  CO2 24 25 25   GLUCOSE 141* 107* 116*  BUN 59* 60* 49*  CREATININE 1.59* 1.42* 1.07*  CALCIUM 9.1 8.2* 8.4*   Liver Function Tests: Recent Labs  Lab 08/26/23 1606  AST 51*  ALT 30  ALKPHOS 43  BILITOT 2.0*  PROT 6.8  ALBUMIN 3.9   CBG: No results for input(s): "GLUCAP" in the last 168 hours.  Discharge time spent: greater than 30 minutes.  Signed: Lurene Shadow, MD [facility] 08/29/2023

## 2023-08-29 NOTE — Consult Note (Signed)
[facility] Customer service manager [facility]) Accountable Care Organization (ACO) [facility] Liaison Note  08/29/2023  Tawnya Pujol Mar 03, 1928 [id number]  Location: [facility] RN Hospital Liaison screened the patient remotely at [facility].  Insurance: [employer] Melinda Preston is a 89 y.o. female who is a Primary Care Patient of [name], Dorie Rank, NP [employer]. The patient was screened for  readmission hospitalization with noted medium risk score for unplanned readmission risk with 1 IP  in 6 months.  The patient was assessed for potential [facility]) Care Management service needs for post hospital transition for care coordination. Review of patient's electronic medical record reveals patient  for closed right hip fracture. Pt will discharged home with hospice ([facility]). No care management needs presented at this time.    [facility] Care Management/Population Health does not replace or interfere with any arrangements made by the Inpatient Transition of Care team.   For questions contact:   Elliot Cousin, RN, [facility] Liaison [place]   Population Health Office Hours MTWF  8:00 am-6:00 pm [phone] mobile [phone] [Office toll free line] Office Hours are M-F 8:30 - 5 pm [email or web address]

## 2023-08-31 ENCOUNTER — Telehealth: Payer: Self-pay

## 2023-08-31 DIAGNOSIS — S065XAA Traumatic subdural hemorrhage with loss of consciousness status unknown, initial encounter: Secondary | ICD-10-CM

## 2023-08-31 NOTE — Telephone Encounter (Signed)
Dr Emogene Morgan saw this patient as a consult on 08/28/23. Per Dr Clide Dales sign out email: "89 y.o. who has been in hospital for ~ 1 week with right hip fracture.  Had some intermittent altered mental status, but when I saw her, she was intact except right hip weakness due to fracture.   CT showed stable left subdural  hematoma.  Ortho does not want to operate on hip due to her medical comorbidities and is recommending comfort measures.  I don't think she needs follow up."

## 2023-09-01 NOTE — Telephone Encounter (Signed)
I spoke to Jillyn Hidden the patient's son. He said that the patient is not mobile since she broke her hip. It's going to be hard to get her to the CT appt and here to the office. Hospice is providing nursing care. He will ask the nurse when some comes in at 12pm if they advice she make an appt.

## 2023-09-01 NOTE — Telephone Encounter (Signed)
Head CT has been placed to be done in 4 weeks, around 09/29/23.   I will send a message to scheduling and someone will call her to get this scheduled. Please get her scheduled for a New Patient appt in around 4 weeks, she will need the head CT done prior.

## 2023-09-02 NOTE — Telephone Encounter (Signed)
I spoke to Melinda Preston and he said that they will go ahead and schedule the CT and visit. I explained to him that the imaging department will call to schedule the CT once that is scheduled I will call him for an appt with Neurosurgery. He agreed.

## 2023-09-08 NOTE — Telephone Encounter (Signed)
Patient declined CT due to fractured hip

## 2024-01-08 DIAGNOSIS — D649 Anemia, unspecified: Secondary | ICD-10-CM | POA: Insufficient documentation

## 2024-01-08 DIAGNOSIS — Z8781 Personal history of (healed) traumatic fracture: Secondary | ICD-10-CM | POA: Insufficient documentation

## 2024-01-13 ENCOUNTER — Ambulatory Visit: Payer: Medicare Other | Admitting: Nurse Practitioner

## 2024-01-13 DIAGNOSIS — R7301 Impaired fasting glucose: Secondary | ICD-10-CM

## 2024-01-13 DIAGNOSIS — F325 Major depressive disorder, single episode, in full remission: Secondary | ICD-10-CM

## 2024-01-13 DIAGNOSIS — I5022 Chronic systolic (congestive) heart failure: Secondary | ICD-10-CM

## 2024-01-13 DIAGNOSIS — M85852 Other specified disorders of bone density and structure, left thigh: Secondary | ICD-10-CM

## 2024-01-13 DIAGNOSIS — I700 Atherosclerosis of aorta: Secondary | ICD-10-CM

## 2024-01-13 DIAGNOSIS — Z9181 History of falling: Secondary | ICD-10-CM

## 2024-01-13 DIAGNOSIS — E7800 Pure hypercholesterolemia, unspecified: Secondary | ICD-10-CM

## 2024-01-13 DIAGNOSIS — D649 Anemia, unspecified: Secondary | ICD-10-CM

## 2024-01-13 DIAGNOSIS — I482 Chronic atrial fibrillation, unspecified: Secondary | ICD-10-CM

## 2024-01-13 DIAGNOSIS — I10 Essential (primary) hypertension: Secondary | ICD-10-CM

## 2024-01-13 DIAGNOSIS — F5101 Primary insomnia: Secondary | ICD-10-CM

## 2024-01-14 DEATH — deceased
